# Patient Record
Sex: Female | Born: 1985
Health system: Southern US, Community
[De-identification: ages and names within clinical notes are randomized; demographics above are authoritative.]

## PROBLEM LIST (undated history)

## (undated) ENCOUNTER — Emergency Department (HOSPITAL_BASED_OUTPATIENT_CLINIC_OR_DEPARTMENT_OTHER): Admission: EM | Payer: 59 | Source: Home / Self Care

## (undated) DIAGNOSIS — Z9289 Personal history of other medical treatment: Secondary | ICD-10-CM

## (undated) DIAGNOSIS — T7840XA Allergy, unspecified, initial encounter: Secondary | ICD-10-CM

## (undated) DIAGNOSIS — F329 Major depressive disorder, single episode, unspecified: Secondary | ICD-10-CM

## (undated) DIAGNOSIS — F319 Bipolar disorder, unspecified: Secondary | ICD-10-CM

## (undated) DIAGNOSIS — IMO0002 Reserved for concepts with insufficient information to code with codable children: Secondary | ICD-10-CM

## (undated) DIAGNOSIS — D219 Benign neoplasm of connective and other soft tissue, unspecified: Secondary | ICD-10-CM

## (undated) DIAGNOSIS — F32A Depression, unspecified: Secondary | ICD-10-CM

## (undated) DIAGNOSIS — J45909 Unspecified asthma, uncomplicated: Secondary | ICD-10-CM

## (undated) DIAGNOSIS — R51 Headache: Secondary | ICD-10-CM

## (undated) DIAGNOSIS — N83209 Unspecified ovarian cyst, unspecified side: Secondary | ICD-10-CM

## (undated) HISTORY — DX: Reserved for concepts with insufficient information to code with codable children: IMO0002

## (undated) HISTORY — DX: Allergy, unspecified, initial encounter: T78.40XA

---
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
## 2007-10-26 ENCOUNTER — Emergency Department (HOSPITAL_COMMUNITY): Admission: EM | Admit: 2007-10-26 | Discharge: 2007-10-26 | Payer: Self-pay | Admitting: Emergency Medicine

## 2008-02-23 ENCOUNTER — Emergency Department (HOSPITAL_COMMUNITY): Admission: EM | Admit: 2008-02-23 | Discharge: 2008-02-24 | Payer: Self-pay | Admitting: Emergency Medicine

## 2008-07-18 ENCOUNTER — Emergency Department (HOSPITAL_COMMUNITY): Admission: EM | Admit: 2008-07-18 | Discharge: 2008-07-18 | Payer: Self-pay | Admitting: Emergency Medicine

## 2008-09-13 ENCOUNTER — Emergency Department (HOSPITAL_COMMUNITY): Admission: EM | Admit: 2008-09-13 | Discharge: 2008-09-14 | Payer: Self-pay | Admitting: Emergency Medicine

## 2008-12-19 ENCOUNTER — Emergency Department (HOSPITAL_COMMUNITY): Admission: EM | Admit: 2008-12-19 | Discharge: 2008-12-19 | Payer: Self-pay | Admitting: Emergency Medicine

## 2009-02-01 ENCOUNTER — Emergency Department (HOSPITAL_COMMUNITY): Admission: EM | Admit: 2009-02-01 | Discharge: 2009-02-01 | Payer: Self-pay | Admitting: Emergency Medicine

## 2009-03-26 ENCOUNTER — Emergency Department (HOSPITAL_COMMUNITY): Admission: EM | Admit: 2009-03-26 | Discharge: 2009-03-26 | Payer: Self-pay | Admitting: Emergency Medicine

## 2009-06-06 ENCOUNTER — Emergency Department (HOSPITAL_COMMUNITY): Admission: EM | Admit: 2009-06-06 | Discharge: 2009-06-06 | Payer: Self-pay | Admitting: Emergency Medicine

## 2009-09-08 ENCOUNTER — Emergency Department (HOSPITAL_COMMUNITY): Admission: EM | Admit: 2009-09-08 | Discharge: 2009-09-08 | Payer: Self-pay | Admitting: Emergency Medicine

## 2009-12-12 ENCOUNTER — Emergency Department (HOSPITAL_COMMUNITY): Admission: EM | Admit: 2009-12-12 | Discharge: 2009-12-12 | Payer: Self-pay | Admitting: Emergency Medicine

## 2010-01-20 ENCOUNTER — Emergency Department (HOSPITAL_COMMUNITY): Admission: EM | Admit: 2010-01-20 | Discharge: 2010-01-21 | Payer: Self-pay | Admitting: Emergency Medicine

## 2010-03-17 ENCOUNTER — Emergency Department (HOSPITAL_COMMUNITY): Admission: EM | Admit: 2010-03-17 | Discharge: 2010-03-18 | Payer: Self-pay | Admitting: Emergency Medicine

## 2010-05-04 ENCOUNTER — Emergency Department (HOSPITAL_COMMUNITY): Admission: EM | Admit: 2010-05-04 | Discharge: 2010-05-05 | Payer: Self-pay | Admitting: Emergency Medicine

## 2010-08-26 ENCOUNTER — Emergency Department (HOSPITAL_COMMUNITY)
Admission: EM | Admit: 2010-08-26 | Discharge: 2010-08-26 | Disposition: A | Discharge status: Home / Self Care | Payer: Self-pay | Attending: Emergency Medicine | Admitting: Emergency Medicine

## 2010-08-26 DIAGNOSIS — R109 Unspecified abdominal pain: Secondary | ICD-10-CM | POA: Insufficient documentation

## 2010-08-26 DIAGNOSIS — R112 Nausea with vomiting, unspecified: Secondary | ICD-10-CM | POA: Insufficient documentation

## 2010-08-26 DIAGNOSIS — R51 Headache: Secondary | ICD-10-CM | POA: Insufficient documentation

## 2010-08-26 DIAGNOSIS — IMO0001 Reserved for inherently not codable concepts without codable children: Secondary | ICD-10-CM | POA: Insufficient documentation

## 2010-08-26 DIAGNOSIS — R509 Fever, unspecified: Secondary | ICD-10-CM | POA: Insufficient documentation

## 2010-08-26 LAB — CBC
HCT: 41.6 % (ref 36.0–46.0)
Hemoglobin: 14.8 g/dL (ref 12.0–15.0)
MCH: 32.5 pg (ref 26.0–34.0)
MCHC: 35.6 g/dL (ref 30.0–36.0)
MCV: 91.4 fL (ref 78.0–100.0)

## 2010-08-26 LAB — URINALYSIS, ROUTINE W REFLEX MICROSCOPIC
Ketones, ur: 15 mg/dL — AB
Protein, ur: 30 mg/dL — AB
Urine Glucose, Fasting: NEGATIVE mg/dL
Urobilinogen, UA: 1 mg/dL (ref 0.0–1.0)

## 2010-08-26 LAB — BASIC METABOLIC PANEL
BUN: 10 mg/dL (ref 6–23)
CO2: 26 mEq/L (ref 19–32)
Calcium: 9.5 mg/dL (ref 8.4–10.5)
Creatinine, Ser: 0.63 mg/dL (ref 0.4–1.2)
GFR calc Af Amer: >60 mL/min (ref >60)
Glucose, Bld: 96 mg/dL (ref 70–99)

## 2010-08-26 LAB — DIFFERENTIAL
Basophils Relative: 0 % (ref 0–1)
Lymphs Abs: 0.3 10*3/uL — ABNORMAL LOW (ref 0.7–4.0)
Monocytes Absolute: 0.3 10*3/uL (ref 0.1–1.0)
Monocytes Relative: 3 % (ref 3–12)
Neutro Abs: 8.8 10*3/uL — ABNORMAL HIGH (ref 1.7–7.7)

## 2010-08-26 LAB — URINE MICROSCOPIC-ADD ON

## 2010-09-14 LAB — DIFFERENTIAL
Basophils Absolute: 0 10*3/uL (ref 0.0–0.1)
Basophils Relative: 1 % (ref 0–1)
Lymphocytes Relative: 31 % (ref 12–46)
Monocytes Absolute: 0.5 10*3/uL (ref 0.1–1.0)
Neutro Abs: 4.9 10*3/uL (ref 1.7–7.7)
Neutrophils Relative %: 61 % (ref 43–77)

## 2010-09-14 LAB — URINALYSIS, ROUTINE W REFLEX MICROSCOPIC
Nitrite: NEGATIVE
Specific Gravity, Urine: 1.031 — ABNORMAL HIGH (ref 1.005–1.030)
Urobilinogen, UA: 1 mg/dL (ref 0.0–1.0)
pH: 6.5 (ref 5.0–8.0)

## 2010-09-14 LAB — POCT PREGNANCY, URINE: Preg Test, Ur: NEGATIVE

## 2010-09-14 LAB — BASIC METABOLIC PANEL
BUN: 11 mg/dL (ref 6–23)
Calcium: 9.6 mg/dL (ref 8.4–10.5)
GFR calc non Af Amer: >60 mL/min (ref >60)
Glucose, Bld: 88 mg/dL (ref 70–99)
Potassium: 3.9 mEq/L (ref 3.5–5.1)
Sodium: 135 mEq/L (ref 135–145)

## 2010-09-14 LAB — CBC
HCT: 36.7 % (ref 36.0–46.0)
Hemoglobin: 12.7 g/dL (ref 12.0–15.0)
MCHC: 34.6 g/dL (ref 30.0–36.0)
RDW: 11.7 % (ref 11.5–15.5)
WBC: 7.9 10*3/uL (ref 4.0–10.5)

## 2010-09-18 LAB — RAPID STREP SCREEN (MED CTR MEBANE ONLY): Streptococcus, Group A Screen (Direct): NEGATIVE

## 2010-09-20 LAB — URINALYSIS, ROUTINE W REFLEX MICROSCOPIC
Bilirubin Urine: NEGATIVE
Ketones, ur: NEGATIVE mg/dL
Nitrite: NEGATIVE
Protein, ur: NEGATIVE mg/dL
Urobilinogen, UA: 0.2 mg/dL (ref 0.0–1.0)

## 2010-09-20 LAB — GC/CHLAMYDIA PROBE AMP, GENITAL: Chlamydia, DNA Probe: NEGATIVE

## 2010-09-20 LAB — POCT PREGNANCY, URINE: Preg Test, Ur: NEGATIVE

## 2010-09-20 LAB — WET PREP, GENITAL

## 2010-09-26 LAB — POCT PREGNANCY, URINE: Preg Test, Ur: NEGATIVE

## 2010-09-26 LAB — RAPID STREP SCREEN (MED CTR MEBANE ONLY): Streptococcus, Group A Screen (Direct): POSITIVE — AB

## 2010-10-05 LAB — WET PREP, GENITAL: Trich, Wet Prep: NONE SEEN

## 2010-10-05 LAB — URINE MICROSCOPIC-ADD ON

## 2010-10-05 LAB — URINALYSIS, ROUTINE W REFLEX MICROSCOPIC
Leukocytes, UA: NEGATIVE
Nitrite: NEGATIVE
Specific Gravity, Urine: 1.01 (ref 1.005–1.030)
Urobilinogen, UA: 0.2 mg/dL (ref 0.0–1.0)
pH: 8 (ref 5.0–8.0)

## 2010-10-05 LAB — GC/CHLAMYDIA PROBE AMP, GENITAL: GC Probe Amp, Genital: NEGATIVE

## 2010-10-08 LAB — URINALYSIS, ROUTINE W REFLEX MICROSCOPIC
Nitrite: NEGATIVE
Protein, ur: NEGATIVE mg/dL
Specific Gravity, Urine: 1.022 (ref 1.005–1.030)
Urobilinogen, UA: 1 mg/dL (ref 0.0–1.0)

## 2010-10-08 LAB — URINE MICROSCOPIC-ADD ON

## 2010-10-08 LAB — POCT PREGNANCY, URINE: Preg Test, Ur: NEGATIVE

## 2010-10-09 LAB — URINALYSIS, ROUTINE W REFLEX MICROSCOPIC
Ketones, ur: 15 mg/dL — AB
Nitrite: NEGATIVE
Protein, ur: 30 mg/dL — AB
Urobilinogen, UA: 0.2 mg/dL (ref 0.0–1.0)

## 2010-10-09 LAB — DIFFERENTIAL
Basophils Absolute: 0 10*3/uL (ref 0.0–0.1)
Basophils Relative: 1 % (ref 0–1)
Eosinophils Absolute: 0 10*3/uL (ref 0.0–0.7)
Neutrophils Relative %: 82 % — ABNORMAL HIGH (ref 43–77)

## 2010-10-09 LAB — COMPREHENSIVE METABOLIC PANEL
ALT: 13 U/L (ref 0–35)
Alkaline Phosphatase: 53 U/L (ref 39–117)
BUN: 9 mg/dL (ref 6–23)
CO2: 26 mEq/L (ref 19–32)
GFR calc non Af Amer: >60 mL/min (ref >60)
Glucose, Bld: 72 mg/dL (ref 70–99)
Potassium: 4.5 mEq/L (ref 3.5–5.1)
Sodium: 140 mEq/L (ref 135–145)

## 2010-10-09 LAB — ETHANOL: Alcohol, Ethyl (B): <5 mg/dL (ref 0–10)

## 2010-10-09 LAB — RAPID URINE DRUG SCREEN, HOSP PERFORMED
Amphetamines: NOT DETECTED
Cocaine: NOT DETECTED
Opiates: NOT DETECTED
Tetrahydrocannabinol: NOT DETECTED

## 2010-10-09 LAB — LIPASE, BLOOD: Lipase: 20 U/L (ref 11–59)

## 2010-10-09 LAB — CBC
HCT: 42.3 % (ref 36.0–46.0)
Hemoglobin: 14.7 g/dL (ref 12.0–15.0)
MCHC: 34.7 g/dL (ref 30.0–36.0)
RBC: 4.41 MIL/uL (ref 3.87–5.11)

## 2010-10-09 LAB — URINE MICROSCOPIC-ADD ON

## 2010-10-11 LAB — URINALYSIS, ROUTINE W REFLEX MICROSCOPIC
Bilirubin Urine: NEGATIVE
Hgb urine dipstick: NEGATIVE
Specific Gravity, Urine: 1.022 (ref 1.005–1.030)
Urobilinogen, UA: 0.2 mg/dL (ref 0.0–1.0)

## 2010-10-11 LAB — GC/CHLAMYDIA PROBE AMP, GENITAL: Chlamydia, DNA Probe: NEGATIVE

## 2010-10-11 LAB — WET PREP, GENITAL: Yeast Wet Prep HPF POC: NONE SEEN

## 2010-10-14 LAB — URINALYSIS, ROUTINE W REFLEX MICROSCOPIC
Protein, ur: NEGATIVE mg/dL
Urobilinogen, UA: 0.2 mg/dL (ref 0.0–1.0)

## 2010-10-14 LAB — POCT I-STAT, CHEM 8
BUN: 14 mg/dL (ref 6–23)
Calcium, Ion: 1.22 mmol/L (ref 1.12–1.32)
Chloride: 106 mEq/L (ref 96–112)
HCT: 42 % (ref 36.0–46.0)
Sodium: 138 mEq/L (ref 135–145)
TCO2: 23 mmol/L (ref 0–100)

## 2010-10-14 LAB — DIFFERENTIAL
Lymphocytes Relative: 1 % — ABNORMAL LOW (ref 12–46)
Monocytes Absolute: 0.3 10*3/uL (ref 0.1–1.0)
Monocytes Relative: 2 % — ABNORMAL LOW (ref 3–12)
Neutro Abs: 12.4 10*3/uL — ABNORMAL HIGH (ref 1.7–7.7)
Neutrophils Relative %: 96 % — ABNORMAL HIGH (ref 43–77)

## 2010-10-14 LAB — GC/CHLAMYDIA PROBE AMP, GENITAL: Chlamydia, DNA Probe: NEGATIVE

## 2010-10-14 LAB — WET PREP, GENITAL
Trich, Wet Prep: NONE SEEN
Yeast Wet Prep HPF POC: NONE SEEN

## 2010-10-14 LAB — RPR: RPR Ser Ql: NONREACTIVE

## 2010-10-14 LAB — CBC
Platelets: 274 10*3/uL (ref 150–400)
RDW: 12 % (ref 11.5–15.5)

## 2010-10-14 LAB — URINE MICROSCOPIC-ADD ON

## 2010-10-18 LAB — CULTURE, ROUTINE-ABSCESS

## 2010-11-04 ENCOUNTER — Emergency Department (HOSPITAL_COMMUNITY)
Admission: EM | Admit: 2010-11-04 | Discharge: 2010-11-05 | Disposition: A | Discharge status: Home / Self Care | Payer: Self-pay | Attending: Emergency Medicine | Admitting: Emergency Medicine

## 2010-11-04 DIAGNOSIS — R141 Gas pain: Secondary | ICD-10-CM | POA: Insufficient documentation

## 2010-11-04 DIAGNOSIS — K5900 Constipation, unspecified: Secondary | ICD-10-CM | POA: Insufficient documentation

## 2010-11-04 DIAGNOSIS — R143 Flatulence: Secondary | ICD-10-CM | POA: Insufficient documentation

## 2010-11-04 DIAGNOSIS — R142 Eructation: Secondary | ICD-10-CM | POA: Insufficient documentation

## 2010-11-04 DIAGNOSIS — R109 Unspecified abdominal pain: Secondary | ICD-10-CM | POA: Insufficient documentation

## 2010-11-04 DIAGNOSIS — R110 Nausea: Secondary | ICD-10-CM | POA: Insufficient documentation

## 2010-11-05 ENCOUNTER — Emergency Department (HOSPITAL_COMMUNITY): Payer: Self-pay

## 2010-11-05 LAB — URINALYSIS, ROUTINE W REFLEX MICROSCOPIC
Bilirubin Urine: NEGATIVE
Glucose, UA: NEGATIVE mg/dL
Hgb urine dipstick: NEGATIVE
Ketones, ur: 15 mg/dL — AB
Protein, ur: NEGATIVE mg/dL
Urobilinogen, UA: 1 mg/dL (ref 0.0–1.0)

## 2010-11-05 LAB — BASIC METABOLIC PANEL
BUN: 11 mg/dL (ref 6–23)
CO2: 24 mEq/L (ref 19–32)
Calcium: 9.8 mg/dL (ref 8.4–10.5)
Creatinine, Ser: 0.57 mg/dL (ref 0.4–1.2)
GFR calc non Af Amer: >60 mL/min (ref >60)
Glucose, Bld: 99 mg/dL (ref 70–99)

## 2010-11-05 LAB — CBC
HCT: 33.5 % — ABNORMAL LOW (ref 36.0–46.0)
Hemoglobin: 11.7 g/dL — ABNORMAL LOW (ref 12.0–15.0)
MCH: 32.1 pg (ref 26.0–34.0)
MCHC: 34.9 g/dL (ref 30.0–36.0)
MCV: 92 fL (ref 78.0–100.0)
RDW: 12.2 % (ref 11.5–15.5)

## 2010-11-05 LAB — DIFFERENTIAL
Basophils Absolute: 0 10*3/uL (ref 0.0–0.1)
Eosinophils Relative: 1 % (ref 0–5)
Lymphocytes Relative: 38 % (ref 12–46)
Monocytes Absolute: 0.3 10*3/uL (ref 0.1–1.0)
Monocytes Relative: 5 % (ref 3–12)
Neutro Abs: 3.7 10*3/uL (ref 1.7–7.7)

## 2011-01-31 ENCOUNTER — Emergency Department (HOSPITAL_COMMUNITY): Payer: Self-pay

## 2011-01-31 ENCOUNTER — Emergency Department (HOSPITAL_COMMUNITY)
Admission: EM | Admit: 2011-01-31 | Discharge: 2011-01-31 | Disposition: A | Discharge status: Home / Self Care | Payer: Self-pay | Attending: Emergency Medicine | Admitting: Emergency Medicine

## 2011-01-31 DIAGNOSIS — M25579 Pain in unspecified ankle and joints of unspecified foot: Secondary | ICD-10-CM | POA: Insufficient documentation

## 2011-01-31 DIAGNOSIS — M25559 Pain in unspecified hip: Secondary | ICD-10-CM | POA: Insufficient documentation

## 2011-01-31 DIAGNOSIS — M542 Cervicalgia: Secondary | ICD-10-CM | POA: Insufficient documentation

## 2011-01-31 DIAGNOSIS — S7010XA Contusion of unspecified thigh, initial encounter: Secondary | ICD-10-CM | POA: Insufficient documentation

## 2011-01-31 DIAGNOSIS — S93409A Sprain of unspecified ligament of unspecified ankle, initial encounter: Secondary | ICD-10-CM | POA: Insufficient documentation

## 2011-01-31 DIAGNOSIS — R1909 Other intra-abdominal and pelvic swelling, mass and lump: Secondary | ICD-10-CM | POA: Insufficient documentation

## 2011-01-31 DIAGNOSIS — S139XXA Sprain of joints and ligaments of unspecified parts of neck, initial encounter: Secondary | ICD-10-CM | POA: Insufficient documentation

## 2011-01-31 DIAGNOSIS — M79609 Pain in unspecified limb: Secondary | ICD-10-CM | POA: Insufficient documentation

## 2011-02-02 ENCOUNTER — Encounter: Payer: Self-pay | Admitting: Obstetrics and Gynecology

## 2011-04-01 ENCOUNTER — Emergency Department (HOSPITAL_COMMUNITY): Payer: Self-pay

## 2011-04-01 ENCOUNTER — Emergency Department (HOSPITAL_COMMUNITY)
Admission: EM | Admit: 2011-04-01 | Discharge: 2011-04-01 | Disposition: A | Discharge status: Home / Self Care | Payer: Self-pay | Attending: Emergency Medicine | Admitting: Emergency Medicine

## 2011-04-01 ENCOUNTER — Inpatient Hospital Stay (INDEPENDENT_AMBULATORY_CARE_PROVIDER_SITE_OTHER)
Admission: RE | Admit: 2011-04-01 | Discharge: 2011-04-01 | Disposition: A | Discharge status: Home / Self Care | Payer: Self-pay | Source: Ambulatory Visit | Attending: Emergency Medicine | Admitting: Emergency Medicine

## 2011-04-01 DIAGNOSIS — R0789 Other chest pain: Secondary | ICD-10-CM | POA: Insufficient documentation

## 2011-04-01 DIAGNOSIS — R05 Cough: Secondary | ICD-10-CM | POA: Insufficient documentation

## 2011-04-01 DIAGNOSIS — R059 Cough, unspecified: Secondary | ICD-10-CM | POA: Insufficient documentation

## 2011-04-01 DIAGNOSIS — I4949 Other premature depolarization: Secondary | ICD-10-CM

## 2011-04-01 DIAGNOSIS — R0989 Other specified symptoms and signs involving the circulatory and respiratory systems: Secondary | ICD-10-CM

## 2011-04-01 DIAGNOSIS — N949 Unspecified condition associated with female genital organs and menstrual cycle: Secondary | ICD-10-CM | POA: Insufficient documentation

## 2011-04-01 DIAGNOSIS — R0602 Shortness of breath: Secondary | ICD-10-CM | POA: Insufficient documentation

## 2011-04-01 LAB — POCT URINALYSIS DIP (DEVICE)
Glucose, UA: NEGATIVE mg/dL
Hgb urine dipstick: NEGATIVE
Specific Gravity, Urine: 1.02 (ref 1.005–1.030)
Urobilinogen, UA: 0.2 mg/dL (ref 0.0–1.0)
pH: 7.5 (ref 5.0–8.0)

## 2011-06-08 ENCOUNTER — Emergency Department (HOSPITAL_COMMUNITY)
Admission: EM | Admit: 2011-06-08 | Discharge: 2011-06-08 | Disposition: A | Discharge status: Home / Self Care | Payer: Self-pay | Attending: Emergency Medicine | Admitting: Emergency Medicine

## 2011-06-08 DIAGNOSIS — M546 Pain in thoracic spine: Secondary | ICD-10-CM | POA: Insufficient documentation

## 2011-06-08 MED ORDER — CYCLOBENZAPRINE HCL 10 MG PO TABS: 10.0000 mg | ORAL_TABLET | Freq: Two times a day (BID) | ORAL | Status: AC | PRN

## 2011-06-08 MED ORDER — OXYCODONE-ACETAMINOPHEN 5-325 MG PO TABS: 1.0000 | ORAL_TABLET | ORAL | Status: AC | PRN

## 2011-06-08 MED ORDER — OXYCODONE-ACETAMINOPHEN 5-325 MG PO TABS
1.0000 | ORAL_TABLET | Freq: Once | ORAL | Status: AC
  Administered 2011-06-08: 1 via ORAL
  Filled 2011-06-08: qty 1

## 2011-06-08 NOTE — ED Provider Notes (Signed)
History     CSN: [id number] Arrival date & time: 06/08/2011  1:04 PM   First MD Initiated Contact with Patient 06/08/11 1627      Chief Complaint  Patient presents with  . Back Pain    (Consider location/radiation/quality/duration/timing/severity/associated sxs/prior treatment) Patient is a 25 y.o. female presenting with back pain. The history is provided by the patient.  Back Pain  This is a new problem. The current episode started 2 days ago. The problem occurs constantly. The problem has not changed since onset.The pain is associated with no known injury. The pain is present in the thoracic spine. The quality of the pain is described as aching. The pain does not radiate. The pain is at a severity of 7/10. The symptoms are aggravated by bending and certain positions. The pain is the same all the time. Pertinent negatives include no chest pain, no fever, no headaches, no abdominal pain, no bowel incontinence, no bladder incontinence and no weakness. She has tried NSAIDs for the symptoms. The treatment provided mild relief.    History reviewed. No pertinent past medical history.  History reviewed. No pertinent past surgical history.  No family history on file.  History  Substance Use Topics  . Smoking status: Never Smoker   . Smokeless tobacco: Not on file  . Alcohol Use: Yes    OB History    Grav Para Term Preterm Abortions TAB SAB Ect Mult Living                  Review of Systems  Constitutional: Negative for fever and chills.  HENT: Negative for neck pain.   Respiratory: Negative for cough and shortness of breath.   Cardiovascular: Negative for chest pain and palpitations.  Gastrointestinal: Negative for nausea, vomiting, abdominal pain and bowel incontinence.  Genitourinary: Negative for bladder incontinence.  Musculoskeletal: Positive for back pain. Negative for gait problem.  Skin: Negative for color change and rash.  Neurological: Negative for weakness,  light-headedness and headaches.  All other systems reviewed and are negative.    Allergies  Penicillins  Home Medications  No current outpatient prescriptions on file.  BP 133/68  Pulse 79  Temp(Src) 98.9 F (37.2 C) (Oral)  Resp 16  Ht 5\' 7"  (1.702 m)  Wt 200 lb (90.719 kg)  BMI 31.32 kg/m2  SpO2 96%  LMP 05/29/2011  Physical Exam  Nursing note and vitals reviewed. Constitutional: She is oriented to person, place, and time. She appears well-developed and well-nourished.  HENT:  Head: Normocephalic and atraumatic.  Eyes: Pupils are equal, round, and reactive to light.  Cardiovascular: Normal rate, regular rhythm, normal heart sounds and intact distal pulses.   Pulmonary/Chest: Effort normal and breath sounds normal. No respiratory distress. She exhibits no tenderness.  Abdominal: Soft. She exhibits no distension. There is no tenderness.  Musculoskeletal: She exhibits no edema.       Arms: Neurological: She is alert and oriented to person, place, and time.  Skin: Skin is warm and dry.  Psychiatric: She has a normal mood and affect.    ED Course  Procedures (including critical care time)  Labs Reviewed - No data to display No results found.   No diagnosis found.    MDM  This is a 25 year old female, who presents today for upper back pain, which began when she woke from sleep. States she has been taking Tylenol and ibuprofen over-the-counter, with some transient relief of her pain. She notes that at times when she takes a  deep breath the pain in her back seems to feel worse, and there is also a strong positional component to the pain. She denies any chest pain, recent injuries, cough, fevers or other infectious symptoms, lower extremity edema, and does not take birth control pills. Exam is remarkable for mid thoracic paraspinal muscular tenderness, as well as moderate tenderness in the bilateral trapezii, reproducing her subjective complaint, and she has some worsening  of the pain with movement of the neck and shoulders. The findings on exam, the liver the patient's pain is constant by a musculoskeletal source, will have patient continue treatment with over-the-counter medications, as well as muscle relaxants, discussed expectation of pain course, continued treatment, and indications were returned. The patient expresses understanding with this plan.        Theotis Burrow, MD 06/09/11 [phone]

## 2011-06-08 NOTE — ED Notes (Signed)
Patient states she woke up x 2 days ago with back pain. 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 Patient denies she has not done anything to hurt her back. Patient states the pain is so bad that she has a hard time getting her breath some time. Patient denies chest pain, n/v/fever.

## 2011-06-08 NOTE — Progress Notes (Signed)
25 year old female with a two-day history of upper back pain worse with movement worse with deep breath. Her vital signs are normal, and her exam shows significant muscle spasm of the paraspinal muscles in the thoracic region. Lungs are clear. Heart has regular rate and rhythm with a 2/6 systolic ejection murmur heard along the sternal border. Overall picture is the most consistent with musculoskeletal pain. She will be treated with NSAIDs, muscle relaxers, and narcotics.

## 2011-06-08 NOTE — ED Notes (Signed)
Pt presents with onset of upper back pain x 2 days that has been constant and today, radiated into mid-chest.  Pt reports shortness of breath, denies any injury or cough.

## 2011-06-09 NOTE — ED Provider Notes (Signed)
I saw and evaluated the patient, reviewed the resident's note and I agree with the findings and plan.   Dione Booze, MD 06/09/11 1044

## 2011-09-13 ENCOUNTER — Emergency Department (HOSPITAL_COMMUNITY): Payer: PRIVATE HEALTH INSURANCE

## 2011-09-13 ENCOUNTER — Emergency Department (HOSPITAL_COMMUNITY)
Admission: EM | Admit: 2011-09-13 | Discharge: 2011-09-13 | Disposition: A | Discharge status: Home / Self Care | Payer: PRIVATE HEALTH INSURANCE | Attending: Emergency Medicine | Admitting: Emergency Medicine

## 2011-09-13 ENCOUNTER — Encounter (HOSPITAL_COMMUNITY): Payer: Self-pay | Admitting: *Deleted

## 2011-09-13 DIAGNOSIS — J069 Acute upper respiratory infection, unspecified: Secondary | ICD-10-CM | POA: Insufficient documentation

## 2011-09-13 DIAGNOSIS — R509 Fever, unspecified: Secondary | ICD-10-CM | POA: Insufficient documentation

## 2011-09-13 DIAGNOSIS — R05 Cough: Secondary | ICD-10-CM | POA: Insufficient documentation

## 2011-09-13 DIAGNOSIS — R110 Nausea: Secondary | ICD-10-CM | POA: Insufficient documentation

## 2011-09-13 DIAGNOSIS — R059 Cough, unspecified: Secondary | ICD-10-CM | POA: Insufficient documentation

## 2011-09-13 LAB — DIFFERENTIAL
Basophils Absolute: 0 10*3/uL (ref 0.0–0.1)
Eosinophils Relative: 1 % (ref 0–5)
Lymphocytes Relative: 25 % (ref 12–46)
Lymphs Abs: 1.3 10*3/uL (ref 0.7–4.0)
Neutro Abs: 3.1 10*3/uL (ref 1.7–7.7)

## 2011-09-13 LAB — BASIC METABOLIC PANEL
CO2: 24 mEq/L (ref 19–32)
Calcium: 9 mg/dL (ref 8.4–10.5)
Chloride: 100 mEq/L (ref 96–112)
Glucose, Bld: 95 mg/dL (ref 70–99)
Potassium: 3.7 mEq/L (ref 3.5–5.1)
Sodium: 134 mEq/L — ABNORMAL LOW (ref 135–145)

## 2011-09-13 LAB — CBC
MCV: 90.1 fL (ref 78.0–100.0)
Platelets: 253 10*3/uL (ref 150–400)
RBC: 3.85 MIL/uL — ABNORMAL LOW (ref 3.87–5.11)
WBC: 5 10*3/uL (ref 4.0–10.5)

## 2011-09-13 LAB — URINALYSIS, ROUTINE W REFLEX MICROSCOPIC
Glucose, UA: NEGATIVE mg/dL
Hgb urine dipstick: NEGATIVE
Leukocytes, UA: NEGATIVE
Specific Gravity, Urine: 1.018 (ref 1.005–1.030)
Urobilinogen, UA: 1 mg/dL (ref 0.0–1.0)

## 2011-09-13 LAB — POCT PREGNANCY, URINE: Preg Test, Ur: NEGATIVE

## 2011-09-13 MED ORDER — HYDROCODONE-HOMATROPINE 5-1.5 MG/5ML PO SYRP: 2.5000 mL | ORAL_SOLUTION | Freq: Four times a day (QID) | ORAL | Status: AC | PRN

## 2011-09-13 MED ORDER — ONDANSETRON HCL 4 MG/2ML IJ SOLN
4.0000 mg | Freq: Once | INTRAMUSCULAR | Status: AC
  Administered 2011-09-13: 4 mg via INTRAVENOUS
  Filled 2011-09-13: qty 2

## 2011-09-13 MED ORDER — KETOROLAC TROMETHAMINE 30 MG/ML IJ SOLN
30.0000 mg | Freq: Once | INTRAMUSCULAR | Status: AC
  Administered 2011-09-13: 30 mg via INTRAVENOUS
  Filled 2011-09-13: qty 1

## 2011-09-13 MED ORDER — SODIUM CHLORIDE 0.9 % IV BOLUS (SEPSIS)
1000.0000 mL | Freq: Once | INTRAVENOUS | Status: AC
  Administered 2011-09-13: 1000 mL via INTRAVENOUS

## 2011-09-13 NOTE — ED Notes (Signed)
Patient stated she has been coughing since Saturday and started vomiting tonight

## 2011-09-13 NOTE — ED Provider Notes (Signed)
History     CSN: [id number]  Arrival date & time 09/13/11  0027   First MD Initiated Contact with Patient 09/13/11 [phone]      Chief Complaint  Patient presents with  . Chills     HPI The patient presents with generalized discomfort, cough, nausea.  Her symptoms began gradually 2 days ago.  Since onset symptoms have been worsening.  She denies focal new pain.  She notes no relief with OTC medication.  She notes a fever yesterday of 100.  No confusion, no disorientation, no dyspnea, no chest pain. History reviewed. No pertinent past medical history.  History reviewed. No pertinent past surgical history.  History reviewed. No pertinent family history.  History  Substance Use Topics  . Smoking status: Never Smoker   . Smokeless tobacco: Not on file  . Alcohol Use: Yes     sosically    OB History    Grav Para Term Preterm Abortions TAB SAB Ect Mult Living                  Review of Systems  Constitutional:       HPI  HENT:       HPI otherwise negative  Eyes: Negative.   Respiratory:       HPI, otherwise negative  Cardiovascular:       HPI, otherwise nmegative  Gastrointestinal: Positive for nausea and vomiting. Negative for diarrhea.  Genitourinary:       HPI, otherwise negative  Musculoskeletal:       HPI, otherwise negative  Skin: Negative.   Neurological: Negative for syncope.    Allergies  Penicillins  Home Medications   Current Outpatient Rx  Name Route Sig Dispense Refill  . LUPRON IJ Injection Inject as directed. [facility]-first injection was last week      BP 132/79  Pulse 118  Temp(Src) 99.2 F (37.3 C) (Oral)  Resp 22  SpO2 100%  LMP 08/21/2011  Physical Exam  Nursing note and vitals reviewed. Constitutional: She is oriented to person, place, and time. She appears well-developed and well-nourished. No distress.  HENT:  Head: Normocephalic and atraumatic.  Eyes: Conjunctivae and EOM are normal.  Cardiovascular: Regular rhythm.   Tachycardia present.   Pulmonary/Chest: Effort normal and breath sounds normal. No stridor. No respiratory distress.  Abdominal: She exhibits mass. She exhibits no distension. There is no tenderness.       Lower abdominal firmness, fibroid, per the patient  Musculoskeletal: She exhibits no edema.  Neurological: She is alert and oriented to person, place, and time. No cranial nerve deficit.  Skin: Skin is warm and dry.  Psychiatric: She has a normal mood and affect.    ED Course  Procedures (including critical care time)  Labs Reviewed  CBC - Abnormal; Notable for the following:    RBC 3.85 (*)    HCT 34.7 (*)    All other components within normal limits  BASIC METABOLIC PANEL - Abnormal; Notable for the following:    Sodium 134 (*)    All other components within normal limits  URINALYSIS, ROUTINE W REFLEX MICROSCOPIC  DIFFERENTIAL  POCT PREGNANCY, URINE   Dg Chest 2 View  09/13/2011  *RADIOLOGY REPORT*  Clinical Data: Painful cough  CHEST - 2 VIEW  Comparison: 04/01/2011  Findings: Heart is normal in size.  Lungs are clear.  No pleural  IMPRESSION: No active cardiopulmonary disease.  Original Report Authenticated By: Donavan Burnet, M.D.   Chest x-ray reviewed  by me No diagnosis found.    MDM  This previously well young female presents with 2 days of generalized discomfort, chills.  On exam she is in no distress with clear lung sounds and unremarkable vital signs.  The patient's labs are reassuring for the absence of acute systemic infection.  The patient's chest x-rays similarly reassuring.  The patient's presentation is most consistent with an acute respiratory infection.  The patient was discharged in stable condition with antitussives PMD followup.      Gerhard Munch, MD 09/13/11 [phone]

## 2011-09-13 NOTE — Discharge Instructions (Signed)
Antibiotic Nonuse  Your caregiver felt that the infection or problem was not one that would be helped with an antibiotic. Infections may be caused by viruses or bacteria. Only a caregiver can tell which one of these is the likely cause of an illness. A cold is the most common cause of infection in both adults and children. A cold is a virus. Antibiotic treatment will have no effect on a viral infection. Viruses can lead to many lost days of work caring for sick children and many missed days of school. Children may catch as many as 10 "colds" or "flus" per year during which they can be tearful, cranky, and uncomfortable. The goal of treating a virus is aimed at keeping the ill person comfortable. Antibiotics are medications used to help the body fight bacterial infections. There are relatively few types of bacteria that cause infections but there are hundreds of viruses. While both viruses and bacteria cause infection they are very different types of germs. A viral infection will typically go away by itself within 7 to 10 days. Bacterial infections may spread or get worse without antibiotic treatment. Examples of bacterial infections are:  Sore throats (like strep throat or tonsillitis).   Infection in the lung (pneumonia).   Ear and skin infections.  Examples of viral infections are:  Colds or flus.   Most coughs and bronchitis.   Sore throats not caused by Strep.   Runny noses.  It is often best not to take an antibiotic when a viral infection is the cause of the problem. Antibiotics can kill off the helpful bacteria that we have inside our body and allow harmful bacteria to start growing. Antibiotics can cause side effects such as allergies, nausea, and diarrhea without helping to improve the symptoms of the viral infection. Additionally, repeated uses of antibiotics can cause bacteria inside of our body to become resistant. That resistance can be passed onto harmful bacterial. The next time  you have an infection it may be harder to treat if antibiotics are used when they are not needed. Not treating with antibiotics allows our own immune system to develop and take care of infections more efficiently. Also, antibiotics will work better for us when they are prescribed for bacterial infections. Treatments for a child that is ill may include:  Give extra fluids throughout the day to stay hydrated.   Get plenty of rest.   Only give your child over-the-counter or prescription medicines for pain, discomfort, or fever as directed by your caregiver.   The use of a cool mist humidifier may help stuffy noses.   Cold medications if suggested by your caregiver.  Your caregiver may decide to start you on an antibiotic if:  The problem you were seen for today continues for a longer length of time than expected.   You develop a secondary bacterial infection.  SEEK MEDICAL CARE IF:  Fever lasts longer than 5 days.   Symptoms continue to get worse after 5 to 7 days or become severe.   Difficulty in breathing develops.   Signs of dehydration develop (poor drinking, rare urinating, dark colored urine).   Changes in behavior or worsening tiredness (listlessness or lethargy).  Document Released: 08/29/2001 Document Revised: 06/09/2011 Document Reviewed: 02/25/2009 [employer] Patient Information 2012 [employer]. 

## 2011-09-13 NOTE — ED Notes (Signed)
She has ha cold  Coughing chest congestion and she has vomited since Saturday with a high temp

## 2011-09-14 ENCOUNTER — Encounter (HOSPITAL_COMMUNITY): Payer: Self-pay | Admitting: Emergency Medicine

## 2011-09-14 ENCOUNTER — Emergency Department (HOSPITAL_COMMUNITY)
Admission: EM | Admit: 2011-09-14 | Discharge: 2011-09-14 | Disposition: A | Discharge status: Home / Self Care | Payer: PRIVATE HEALTH INSURANCE | Attending: Emergency Medicine | Admitting: Emergency Medicine

## 2011-09-14 ENCOUNTER — Emergency Department (HOSPITAL_COMMUNITY): Payer: PRIVATE HEALTH INSURANCE

## 2011-09-14 DIAGNOSIS — J189 Pneumonia, unspecified organism: Secondary | ICD-10-CM | POA: Insufficient documentation

## 2011-09-14 MED ORDER — ALBUTEROL SULFATE HFA 108 (90 BASE) MCG/ACT IN AERS: INHALATION_SPRAY | RESPIRATORY_TRACT | Status: DC

## 2011-09-14 MED ORDER — AZITHROMYCIN 250 MG PO TABS: ORAL_TABLET | ORAL | Status: AC

## 2011-09-14 NOTE — ED Provider Notes (Signed)
History     CSN: [id number]  Arrival date & time 09/14/11  1347   First MD Initiated Contact with Patient 09/14/11 1642      Chief Complaint  Patient presents with  . Cough  . Nasal Congestion  . 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 She denies abdominal pain n/v/d. Patient states she has no known medical problems and takes no meds on regular basis. She denies aggravating or alleviating factors. She has taken previously prescribed cough syrup without relief of cough.   History reviewed. No pertinent past medical history.  History reviewed. No pertinent past surgical history.  No family history on file.  History  Substance Use Topics  . Smoking status: Never Smoker   . Smokeless tobacco: Not on file  . Alcohol Use: Yes     sosically    OB History    Grav Para Term Preterm Abortions TAB SAB Ect Mult Living                  Review of Systems  All other systems reviewed and are negative.    Allergies  Penicillins  Home Medications   Current Outpatient Rx  Name Route Sig Dispense Refill  . HYDROCODONE-HOMATROPINE 5-1.5 MG/5ML PO SYRP Oral Take 2.5 mLs by mouth every 6 (six) hours as needed for cough. 30 mL 0  . LUPRON IJ Injection Inject as directed. [facility]-first injection was last week    . ALBUTEROL SULFATE HFA 108 (90 BASE) MCG/ACT IN AERS  Inhale 1-2 puffs into the lungs every 6 (six) hours as needed for coughing. 1 Inhaler 0  . AZITHROMYCIN 250 MG PO TABS  Take 2 tablets by mouth the first day and then take 1 tablet by mouth for the next  4 days for a total of 5 days. 6 tablet 0    BP 140/67  Pulse 92  Temp(Src) 98.9 F (37.2 C) (Oral)  Resp 22  SpO2 100%  LMP 08/21/2011  Physical Exam  Vitals reviewed. Constitutional: She is oriented to person, place, and time. She appears well-developed and well-nourished. No distress.  HENT:  Head: Normocephalic and atraumatic.  Right Ear: External ear normal.  Left Ear: External ear normal.  Nose: Nose normal.  Mouth/Throat: No oropharyngeal exudate.       Mild erythema of posterior pharynx and tonsils no tonsillar exudate or enlargement. Patent airway. Swallowing secretions well  Eyes: Conjunctivae are normal.  Neck: Normal range of motion. Neck supple.  Cardiovascular: Normal rate, regular rhythm and normal heart sounds.  Exam reveals no gallop and no friction rub.   No murmur heard. Pulmonary/Chest: Effort normal and breath sounds normal. No respiratory distress. She has no wheezes. She has no rales. She exhibits no tenderness.  Abdominal: Soft. Bowel sounds are normal. She exhibits no distension and no mass. There is no tenderness. There is no rebound and no guarding.  Lymphadenopathy:    She has no cervical adenopathy.  Neurological: She is alert and oriented to person, place, and time. She has normal reflexes.  Skin: Skin is warm and dry. No rash  noted. She is not diaphoretic.  Psychiatric: She has a normal mood and affect.    ED Course  Procedures (including critical care time)  Labs Reviewed - No data to display Dg Chest 2 View  09/14/2011  *RADIOLOGY REPORT*  Clinical Data: Cough and chest pain.  CHEST - 2 VIEW  Comparison: A chest x-ray 09/13/2011.  Findings: When compared to the recent prior radiograph there is a subtle area of interstitial prominence and patchy air space disease in the left mid and lower hemithorax which appears to project over the spine on the lateral view, concerning for an area of early infection (possibly a bronchopneumonia) within the left  lower lobe. Lungs are otherwise clear.  No definite pleural effusions. Pulmonary vasculature is normal.  Heart size is normal. Mediastinal contours are unremarkable.  IMPRESSION: 1.  Findings, as above, concerning for developing bronchopneumonia in the left lower lobe.  Original Report Authenticated By: Florencia Reasons, M.D.   Dg Chest 2 View  09/13/2011  *RADIOLOGY REPORT*  Clinical Data: Painful cough  CHEST - 2 VIEW  Comparison: 04/01/2011  Findings: Heart is normal in size.  Lungs are clear.  No pleural  IMPRESSION: No active cardiopulmonary disease.  Original Report Authenticated By: Donavan Burnet, M.D.     1. CAP (community acquired pneumonia)       MDM  Patient has no comorbidities and therefore appropriate for OP management of CAP. Pulse ox 100% on RA without respiratory difficulty. Non toxic appearing.         Jenness [place], [place] 09/14/11 [phone]

## 2011-09-14 NOTE — Discharge Instructions (Signed)
Take antibiotic in complete course. Stay well hydrated. Alternate between tylenol and ibuprofen for mild aches, pains and fever. Use albuterol inhaler as directed for cough. Establishment with a Primary Care provider is Very important for general health care concerns, minor illness and minor injury as well for recheck of ongoing symptoms in 5-7 days Return to ER for changing or worsening of symptoms.  Pneumonia, Adult Pneumonia is an infection of the lungs.  CAUSES Pneumonia may be caused by bacteria or a virus. Usually, these infections are caused by breathing infectious particles into the lungs (respiratory tract). SYMPTOMS   Cough.   Fever.   Chest pain.   Increased rate of breathing.   Wheezing.   Mucus production.  DIAGNOSIS  If you have the common symptoms of pneumonia, your caregiver will typically confirm the diagnosis with a chest X-ray. The X-ray will show an abnormality in the lung (pulmonary infiltrate) if you have pneumonia. Other tests of your blood, urine, or sputum may be done to find the specific cause of your pneumonia. Your caregiver may also do tests (blood gases or pulse oximetry) to see how well your lungs are working. TREATMENT  Some forms of pneumonia may be spread to other people when you cough or sneeze. You may be asked to wear a mask before and during your exam. Pneumonia that is caused by bacteria is treated with antibiotic medicine. Pneumonia that is caused by the influenza virus may be treated with an antiviral medicine. Most other viral infections must run their course. These infections will not respond to antibiotics.  PREVENTION A pneumococcal shot (vaccine) is available to prevent a common bacterial cause of pneumonia. This is usually suggested for:  People over 64 years old.   Patients on chemotherapy.   People with chronic lung problems, such as bronchitis or emphysema.   People with immune system problems.  If you are over 65 or have a high risk  condition, you may receive the pneumococcal vaccine if you have not received it before. In some countries, a routine influenza vaccine is also recommended. This vaccine can help prevent some cases of pneumonia.You may be offered the influenza vaccine as part of your care. If you smoke, it is time to quit. You may receive instructions on how to stop smoking. Your caregiver can provide medicines and counseling to help you quit. HOME CARE INSTRUCTIONS   Cough suppressants may be used if you are losing too much rest. However, coughing protects you by clearing your lungs. You should avoid using cough suppressants if you can.   Your caregiver may have prescribed medicine if he or she thinks your pneumonia is caused by a bacteria or influenza. Finish your medicine even if you start to feel better.   Your caregiver may also prescribe an expectorant. This loosens the mucus to be coughed up.   Only take over-the-counter or prescription medicines for pain, discomfort, or fever as directed by your caregiver.   Do not smoke. Smoking is a common cause of bronchitis and can contribute to pneumonia. If you are a smoker and continue to smoke, your cough may last several weeks after your pneumonia has cleared.   A cold steam vaporizer or humidifier in your room or home may help loosen mucus.   Coughing is often worse at night. Sleeping in a semi-upright position in a recliner or using a couple pillows under your head will help with this.   Get rest as you feel it is needed. Your body will  usually let you know when you need to rest.  SEEK IMMEDIATE MEDICAL CARE IF:   Your illness becomes worse. This is especially true if you are elderly or weakened from any other disease.   You cannot control your cough with suppressants and are losing sleep.   You begin coughing up blood.   You develop pain which is getting worse or is uncontrolled with medicines.   You have a fever.   Any of the symptoms which  initially brought you in for treatment are getting worse rather than better.   You develop shortness of breath or chest pain.  MAKE SURE YOU:   Understand these instructions.   Will watch your condition.   Will get help right away if you are not doing well or get worse.  Document Released: 06/20/2005 Document Revised: 06/09/2011 Document Reviewed: 09/09/2010 [facility] Patient Information 2012 [place], [place].

## 2011-09-14 NOTE — ED Notes (Signed)
Pt states that she has had a cold and runny nose and sinus pressure since sat and coughing a lot to make her chest hurt,

## 2011-09-14 NOTE — ED Notes (Signed)
Pt reports cough with pain in her ribs and back-worse when coughing.  Pt reports sxs onset was Saturday.  Pt reports coughing up yellowish sputum.  Denies any fever at this time.

## 2011-09-15 NOTE — ED Provider Notes (Signed)
Medical screening examination/treatment/procedure(s) were performed by non-physician practitioner and as supervising physician I was immediately available for consultation/collaboration.   Laray Anger, DO 09/15/11 1524

## 2011-11-02 DIAGNOSIS — Z9289 Personal history of other medical treatment: Secondary | ICD-10-CM

## 2011-11-02 HISTORY — DX: Personal history of other medical treatment: Z92.89

## 2011-11-03 ENCOUNTER — Other Ambulatory Visit: Payer: Self-pay | Admitting: Obstetrics and Gynecology

## 2011-11-07 ENCOUNTER — Ambulatory Visit (INDEPENDENT_AMBULATORY_CARE_PROVIDER_SITE_OTHER): Payer: PRIVATE HEALTH INSURANCE | Admitting: Family Medicine

## 2011-11-07 ENCOUNTER — Encounter: Payer: Self-pay | Admitting: Family Medicine

## 2011-11-07 VITALS — BP 111/80 | HR 88 | Ht 67.0 in | Wt 183.0 lb

## 2011-11-07 DIAGNOSIS — J309 Allergic rhinitis, unspecified: Secondary | ICD-10-CM

## 2011-11-07 DIAGNOSIS — M549 Dorsalgia, unspecified: Secondary | ICD-10-CM

## 2011-11-07 DIAGNOSIS — F329 Major depressive disorder, single episode, unspecified: Secondary | ICD-10-CM

## 2011-11-07 MED ORDER — FLUOXETINE HCL 20 MG PO TABS: 20.0000 mg | ORAL_TABLET | Freq: Every day | ORAL | Status: DC

## 2011-11-07 MED ORDER — MOMETASONE FUROATE 50 MCG/ACT NA SUSP: 2.0000 | Freq: Every day | NASAL | Status: DC

## 2011-11-07 NOTE — Patient Instructions (Signed)
For the back pain, you can take tylenol 2 tabs 4 times a day, as long as you don't exceed 3000mg  in 24 hrs. You can also take ibuprofen as needed 1-2 tabs every 6hrs.  I am going to refer you to physical therapy which I think will help.   I will also send for your allergies a medication called nasonex.

## 2011-11-09 ENCOUNTER — Encounter: Payer: Self-pay | Admitting: Family Medicine

## 2011-11-09 DIAGNOSIS — M549 Dorsalgia, unspecified: Secondary | ICD-10-CM | POA: Insufficient documentation

## 2011-11-09 DIAGNOSIS — F329 Major depressive disorder, single episode, unspecified: Secondary | ICD-10-CM | POA: Insufficient documentation

## 2011-11-09 DIAGNOSIS — F32A Depression, unspecified: Secondary | ICD-10-CM | POA: Insufficient documentation
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
## 2011-11-09 NOTE — Assessment & Plan Note (Addendum)
No red flags warranting imaging at this time. Recommended tylenol and ibuprofen. Also will refer for physical therapy. Will also talk about physical activity at next visit.

## 2011-11-09 NOTE — Assessment & Plan Note (Addendum)
Phq9: score of 23 Patient open to starting medication. Not interested in counseling at the time as she feels she doesn't have the time in her schedule.  No signs of mania.  Start fluoxetine 20 and follow up in 3-4 weeks after her abdominal surgery (scheduled for 12/02/11).

## 2011-11-09 NOTE — Assessment & Plan Note (Signed)
Continue nasonex

## 2011-11-14 ENCOUNTER — Encounter (HOSPITAL_COMMUNITY): Payer: Self-pay | Admitting: Pharmacist

## 2011-11-16 ENCOUNTER — Encounter (HOSPITAL_COMMUNITY)
Admission: RE | Admit: 2011-11-16 | Discharge: 2011-11-16 | Disposition: A | Discharge status: Home / Self Care | Payer: PRIVATE HEALTH INSURANCE | Source: Ambulatory Visit | Attending: Obstetrics and Gynecology | Admitting: Obstetrics and Gynecology

## 2011-11-16 ENCOUNTER — Encounter (HOSPITAL_COMMUNITY): Payer: Self-pay

## 2011-11-16 DIAGNOSIS — Z01818 Encounter for other preprocedural examination: Secondary | ICD-10-CM | POA: Insufficient documentation

## 2011-11-16 DIAGNOSIS — Z01812 Encounter for preprocedural laboratory examination: Secondary | ICD-10-CM | POA: Insufficient documentation

## 2011-11-16 HISTORY — DX: Depression, unspecified: F32.A

## 2011-11-16 HISTORY — DX: Major depressive disorder, single episode, unspecified: F32.9

## 2011-11-16 LAB — COMPREHENSIVE METABOLIC PANEL
ALT: 9 U/L (ref 0–35)
Alkaline Phosphatase: 47 U/L (ref 39–117)
BUN: 9 mg/dL (ref 6–23)
CO2: 22 mEq/L (ref 19–32)
Chloride: 104 mEq/L (ref 96–112)
GFR calc Af Amer: >90 mL/min (ref >90)
Glucose, Bld: 105 mg/dL — ABNORMAL HIGH (ref 70–99)
Potassium: 3.6 mEq/L (ref 3.5–5.1)
Sodium: 137 mEq/L (ref 135–145)
Total Bilirubin: 0.5 mg/dL (ref 0.3–1.2)
Total Protein: 7.1 g/dL (ref 6.0–8.3)

## 2011-11-16 LAB — SURGICAL PCR SCREEN: Staphylococcus aureus: NEGATIVE

## 2011-11-16 LAB — CBC
HCT: 36.3 % (ref 36.0–46.0)
Hemoglobin: 12.1 g/dL (ref 12.0–15.0)
RBC: 3.92 MIL/uL (ref 3.87–5.11)
WBC: 4.7 10*3/uL (ref 4.0–10.5)

## 2011-11-16 LAB — APTT: aPTT: 31 seconds (ref 24–37)

## 2011-11-16 LAB — PROTIME-INR: Prothrombin Time: 13.5 seconds (ref 11.6–15.2)

## 2011-11-16 NOTE — Patient Instructions (Addendum)
[address] [facility]  11/16/2011   Your procedure is scheduled on:  12/02/11  Enter through the Main Entrance of [facility] at 6 AM.  Pick up the phone at the desk and dial [phone].   Call this number if you have problems the morning of surgery: [phone]   Remember:   Do not eat food:After Midnight.  Do not drink clear liquids: After Midnight.  Take these medicines the morning of surgery with A SIP OF WATER: NA      Bring Inhaler day of surgery   Do not wear jewelry, make-up or nail polish.  Do not wear lotions, powders, or perfumes. You may wear deodorant.  Do not shave 48 hours prior to surgery.  Do not bring valuables to the hospital.  Contacts, dentures or bridgework may not be worn into surgery.  Leave suitcase in the car. After surgery it may be brought to your room.  For patients admitted to the hospital, checkout time is 11:00 AM the day of discharge.   Patients discharged the day of surgery will not be allowed to drive home.  Name and phone number of your driver: NA  Special Instructions: CHG Shower Use Special Wash: 1/2 bottle night before surgery and 1/2 bottle morning of surgery.   Please read over the following fact sheets that you were given: MRSA Information

## 2011-11-29 ENCOUNTER — Ambulatory Visit: Payer: PRIVATE HEALTH INSURANCE | Attending: Family Medicine | Admitting: Physical Therapy

## 2011-11-30 ENCOUNTER — Ambulatory Visit: Payer: PRIVATE HEALTH INSURANCE | Admitting: Physical Therapy

## 2011-12-01 ENCOUNTER — Other Ambulatory Visit: Payer: Self-pay | Admitting: Obstetrics and Gynecology

## 2011-12-01 MED ORDER — CEFAZOLIN SODIUM-DEXTROSE 2-3 GM-% IV SOLR
2.0000 g | INTRAVENOUS | Status: AC
  Administered 2011-12-02: 2 g via INTRAVENOUS
  Filled 2011-12-01: qty 50

## 2011-12-01 NOTE — H&P (Signed)
[name] Records is an 26 y.o. female. G0P0 noted on examination to have massive uterine fibroids consistent with a [redacted] week pregnant uterus. The patient presented in February 2013 complaining of pelvic pressure and pain as well as very heavy menses. On examination at that time she was noted to have 24 week size fibroids confirmed by ultrasound. At that time options of treatment were discusses with the patient and it was decided to try to use Depo Lupron therapy to arrest the growth of the fibroids and to halt the heavy vaginal bleeding. The depo Lupron was started but the pateint reported the side effects were intolerable to her and she wanted surgical therapy for the large fibroids. She is now being admitted for an abdominal myomectomy in an effort to preserve her fertility and remove the fibriods.   Pertinent Gynecological History: Menses: flow is excessive with use of 6 pads or tampons on heaviest days  Contraception: condoms DES exposure: denies Blood transfusions: none Sexually transmitted diseases: no past history Previous GYN Procedures: none  Last pap: normal Date: 08/19/2011 OB History: G0, P0     Past Medical History  Diagnosis Date  . Allergy     seasonal allergies  . Pelvic cyst     Seen by gynecologist Dr. Tenny Craw. Scheduled for ex lap on 12/02/11  . Pneumonia   . Depression     Past Surgical History  Procedure Date  . No past surgeries     No family history on file.  Social History:  reports that she has never smoked. She does not have any smokeless tobacco history on file. She reports that she drinks alcohol. She reports that she does not use illicit drugs.  Allergies:  Allergies  Allergen Reactions  . Penicillins Other (See Comments)    Unknown - childhood reaction    No prescriptions prior to admission    ROS  Respiratory: no cough, shortness of breath or hemoptasis GI: no nausea, vomiting or diarrhea GU: no dysuria, or urgency, positive for pressure and  frequency Gyn: cycles are regular and heavy with clots. No discharge or itch. Positive pelvic pressure and pain.    There were no vitals taken for this visit. Physical Exam  Afebrile  BP 122/70  Pulse 84  Respirations 16  Head; normocephalic and atraumatic Eyes; PERRLA Neck: Supple with no JVD, no enlargement of the thyroid, no adenopathy Chest: clear to P&A Heart: regular rhythm with no murmur or gallop Abdomen: soft, there is a large mass extending to 5 cm above umbilicus that is firm arising from the pelvis. There is no rebound or guarding Back: no CVA pain or deformity Pelvic Exam:                            External genitalia: WNL                           BUS: WNL                          Vagina: without lesion or significant discharge                           Cervix without lesion                          Uterus  is massively enlarged with multiple uterine fibroids consistent with 24 week size fibroids                            Adnexa can not be assessed due to massive fibroids  Neurologic exam grossly intact Skin without gross lesions No results found for this or any previous visit (from the past 24 hour(s)).  No results found.  Assessment/Plan:  Massive uterine fibroids in patient with no children  Plan: abdominal myomectomy  The risks and benefits of the procedure were discussed including risk of blood transfusion, infection, injury to adjacent structures and hysterectomy that would result in patient never being able to have children.  [name] 12/01/2011, 4:49 PM

## 2011-12-02 ENCOUNTER — Encounter (HOSPITAL_COMMUNITY): Payer: Self-pay

## 2011-12-02 ENCOUNTER — Encounter (HOSPITAL_COMMUNITY)
Admission: RE | Disposition: A | Discharge status: Home / Self Care | Payer: Self-pay | Source: Ambulatory Visit | Attending: Obstetrics and Gynecology

## 2011-12-02 ENCOUNTER — Encounter (HOSPITAL_COMMUNITY): Payer: Self-pay | Admitting: Anesthesiology

## 2011-12-02 ENCOUNTER — Inpatient Hospital Stay (HOSPITAL_COMMUNITY)
Admission: RE | Admit: 2011-12-02 | Discharge: 2011-12-06 | DRG: 743 | Disposition: A | Discharge status: Home / Self Care | Payer: PRIVATE HEALTH INSURANCE | Source: Ambulatory Visit | Attending: Obstetrics and Gynecology | Admitting: Obstetrics and Gynecology

## 2011-12-02 ENCOUNTER — Inpatient Hospital Stay (HOSPITAL_COMMUNITY): Payer: PRIVATE HEALTH INSURANCE | Admitting: Anesthesiology

## 2011-12-02 DIAGNOSIS — Z01812 Encounter for preprocedural laboratory examination: Secondary | ICD-10-CM

## 2011-12-02 DIAGNOSIS — D649 Anemia, unspecified: Secondary | ICD-10-CM | POA: Diagnosis not present

## 2011-12-02 DIAGNOSIS — D259 Leiomyoma of uterus, unspecified: Principal | ICD-10-CM | POA: Diagnosis present

## 2011-12-02 DIAGNOSIS — Z01818 Encounter for other preprocedural examination: Secondary | ICD-10-CM

## 2011-12-02 DIAGNOSIS — N949 Unspecified condition associated with female genital organs and menstrual cycle: Secondary | ICD-10-CM | POA: Diagnosis present

## 2011-12-02 HISTORY — PX: MYOMECTOMY: SHX85

## 2011-12-02 LAB — COMPREHENSIVE METABOLIC PANEL
ALT: 5 U/L (ref 0–35)
AST: 11 U/L (ref 0–37)
Albumin: 1.9 g/dL — ABNORMAL LOW (ref 3.5–5.2)
Calcium: 7.7 mg/dL — ABNORMAL LOW (ref 8.4–10.5)
Chloride: 105 mEq/L (ref 96–112)
Creatinine, Ser: 0.51 mg/dL (ref 0.50–1.10)
Sodium: 134 mEq/L — ABNORMAL LOW (ref 135–145)
Total Bilirubin: 0.9 mg/dL (ref 0.3–1.2)

## 2011-12-02 LAB — MAGNESIUM: Magnesium: 1.3 mg/dL — ABNORMAL LOW (ref 1.5–2.5)

## 2011-12-02 LAB — PREPARE RBC (CROSSMATCH)

## 2011-12-02 LAB — BASIC METABOLIC PANEL
BUN: 6 mg/dL (ref 6–23)
CO2: 28 mEq/L (ref 19–32)
GFR calc non Af Amer: >90 mL/min (ref >90)
Glucose, Bld: 108 mg/dL — ABNORMAL HIGH (ref 70–99)
Potassium: 3.9 mEq/L (ref 3.5–5.1)
Sodium: 130 mEq/L — ABNORMAL LOW (ref 135–145)

## 2011-12-02 LAB — HEMOGLOBIN: Hemoglobin: 11.2 g/dL — ABNORMAL LOW (ref 12.0–15.0)

## 2011-12-02 LAB — PREGNANCY, URINE: Preg Test, Ur: NEGATIVE

## 2011-12-02 SURGERY — MYOMECTOMY, ABDOMINAL APPROACH
Anesthesia: General | Site: Abdomen | Wound class: Clean Contaminated

## 2011-12-02 MED ORDER — LACTATED RINGERS IV SOLN: INTRAVENOUS | Status: DC

## 2011-12-02 MED ORDER — FENTANYL CITRATE 0.05 MG/ML IJ SOLN
INTRAMUSCULAR | Status: AC
  Filled 2011-12-02: qty 5

## 2011-12-02 MED ORDER — CEFAZOLIN SODIUM-DEXTROSE 2-3 GM-% IV SOLR
2.0000 g | Freq: Three times a day (TID) | INTRAVENOUS | Status: AC
  Administered 2011-12-02 – 2011-12-03 (×3): 2 g via INTRAVENOUS
  Filled 2011-12-02 (×3): qty 50

## 2011-12-02 MED ORDER — VASOPRESSIN 20 UNIT/ML IJ SOLN
INTRAVENOUS | Status: DC | PRN
  Administered 2011-12-02: 08:00:00 via INTRAMUSCULAR

## 2011-12-02 MED ORDER — HYDROMORPHONE HCL PF 1 MG/ML IJ SOLN
0.2500 mg | INTRAMUSCULAR | Status: DC | PRN
  Administered 2011-12-02 (×3): 0.5 mg via INTRAVENOUS

## 2011-12-02 MED ORDER — GLYCOPYRROLATE 0.2 MG/ML IJ SOLN
INTRAMUSCULAR | Status: DC | PRN
  Administered 2011-12-02: 0.6 mg via INTRAVENOUS

## 2011-12-02 MED ORDER — LIDOCAINE HCL (CARDIAC) 20 MG/ML IV SOLN
INTRAVENOUS | Status: AC
  Filled 2011-12-02: qty 5

## 2011-12-02 MED ORDER — MIDAZOLAM HCL 2 MG/2ML IJ SOLN
INTRAMUSCULAR | Status: AC
  Filled 2011-12-02: qty 2

## 2011-12-02 MED ORDER — ONDANSETRON HCL 4 MG/2ML IJ SOLN
INTRAMUSCULAR | Status: AC
  Filled 2011-12-02: qty 2

## 2011-12-02 MED ORDER — DIPHENHYDRAMINE HCL 50 MG/ML IJ SOLN: 12.5000 mg | Freq: Four times a day (QID) | INTRAMUSCULAR | Status: DC | PRN

## 2011-12-02 MED ORDER — OXYTOCIN 20 UNITS IN LACTATED RINGERS INFUSION - SIMPLE
125.0000 mL/h | INTRAVENOUS | Status: DC
  Administered 2011-12-02 (×2): 125 mL/h via INTRAVENOUS
  Filled 2011-12-02: qty 1000

## 2011-12-02 MED ORDER — SODIUM CHLORIDE 0.9 % IV SOLN
INTRAVENOUS | Status: DC | PRN
  Administered 2011-12-02: 09:00:00 via INTRAVENOUS

## 2011-12-02 MED ORDER — METOCLOPRAMIDE HCL 5 MG/ML IJ SOLN
INTRAMUSCULAR | Status: AC
  Filled 2011-12-02: qty 2

## 2011-12-02 MED ORDER — CEFAZOLIN SODIUM-DEXTROSE 2-3 GM-% IV SOLR
2.0000 g | Freq: Three times a day (TID) | INTRAVENOUS | Status: DC
  Filled 2011-12-02 (×3): qty 50

## 2011-12-02 MED ORDER — PROPOFOL 10 MG/ML IV EMUL
INTRAVENOUS | Status: DC | PRN
  Administered 2011-12-02: 180 mg via INTRAVENOUS

## 2011-12-02 MED ORDER — OXYCODONE-ACETAMINOPHEN 5-325 MG PO TABS
1.0000 | ORAL_TABLET | ORAL | Status: DC | PRN
  Administered 2011-12-03 (×3): 1 via ORAL
  Administered 2011-12-04 (×3): 2 via ORAL
  Administered 2011-12-04: 1 via ORAL
  Administered 2011-12-05 (×2): 2 via ORAL
  Administered 2011-12-06: 1 via ORAL
  Filled 2011-12-02 (×2): qty 2
  Filled 2011-12-02: qty 1
  Filled 2011-12-02 (×2): qty 2
  Filled 2011-12-02 (×3): qty 1
  Filled 2011-12-02 (×2): qty 2
  Filled 2011-12-02: qty 1

## 2011-12-02 MED ORDER — CALCIUM GLUCONATE 10 % IV SOLN
1.0000 g | Freq: Once | INTRAVENOUS | Status: AC
  Administered 2011-12-02: 1 g via INTRAVENOUS
  Filled 2011-12-02: qty 10

## 2011-12-02 MED ORDER — MEPERIDINE HCL 25 MG/ML IJ SOLN: 6.2500 mg | INTRAMUSCULAR | Status: DC | PRN

## 2011-12-02 MED ORDER — ONDANSETRON HCL 4 MG/2ML IJ SOLN
INTRAMUSCULAR | Status: DC | PRN
  Administered 2011-12-02: 4 mg via INTRAVENOUS

## 2011-12-02 MED ORDER — ROCURONIUM BROMIDE 50 MG/5ML IV SOLN
INTRAVENOUS | Status: AC
  Filled 2011-12-02: qty 1

## 2011-12-02 MED ORDER — PHENYLEPHRINE HCL 10 MG/ML IJ SOLN
INTRAMUSCULAR | Status: DC | PRN
  Administered 2011-12-02 (×13): 80 ug via INTRAVENOUS
  Administered 2011-12-02: 40 ug via INTRAVENOUS
  Administered 2011-12-02 (×3): 80 ug via INTRAVENOUS
  Administered 2011-12-02: 40 ug via INTRAVENOUS
  Administered 2011-12-02 (×3): 80 ug via INTRAVENOUS
  Administered 2011-12-02 (×2): 40 ug via INTRAVENOUS
  Administered 2011-12-02: 80 ug via INTRAVENOUS
  Administered 2011-12-02: 40 ug via INTRAVENOUS
  Administered 2011-12-02: 80 ug via INTRAVENOUS
  Administered 2011-12-02 (×3): 40 ug via INTRAVENOUS
  Administered 2011-12-02 (×5): 80 ug via INTRAVENOUS

## 2011-12-02 MED ORDER — LIDOCAINE HCL (CARDIAC) 20 MG/ML IV SOLN
INTRAVENOUS | Status: DC | PRN
  Administered 2011-12-02: 80 mg via INTRAVENOUS

## 2011-12-02 MED ORDER — NEOSTIGMINE METHYLSULFATE 1 MG/ML IJ SOLN
INTRAMUSCULAR | Status: DC | PRN
  Administered 2011-12-02: 3 mg via INTRAVENOUS

## 2011-12-02 MED ORDER — MENTHOL 3 MG MT LOZG: 1.0000 | LOZENGE | OROMUCOSAL | Status: DC | PRN

## 2011-12-02 MED ORDER — CALCIUM GLUCONATE 10 % IV SOLN
INTRAVENOUS | Status: AC
  Administered 2011-12-02: 1000 mg via INTRAVENOUS
  Filled 2011-12-02: qty 10

## 2011-12-02 MED ORDER — DIPHENHYDRAMINE HCL 12.5 MG/5ML PO ELIX
12.5000 mg | ORAL_SOLUTION | Freq: Four times a day (QID) | ORAL | Status: DC | PRN
  Administered 2011-12-02: 12.5 mg via ORAL
  Filled 2011-12-02: qty 5

## 2011-12-02 MED ORDER — SCOPOLAMINE 1 MG/3DAYS TD PT72
1.0000 | MEDICATED_PATCH | TRANSDERMAL | Status: DC
  Administered 2011-12-02: 1.5 mg via TRANSDERMAL

## 2011-12-02 MED ORDER — SCOPOLAMINE 1 MG/3DAYS TD PT72
MEDICATED_PATCH | TRANSDERMAL | Status: AC
  Filled 2011-12-02: qty 1

## 2011-12-02 MED ORDER — DEXAMETHASONE SODIUM PHOSPHATE 4 MG/ML IJ SOLN
INTRAMUSCULAR | Status: DC | PRN
  Administered 2011-12-02: 5 mg via INTRAVENOUS

## 2011-12-02 MED ORDER — PHENYLEPHRINE 40 MCG/ML (10ML) SYRINGE FOR IV PUSH (FOR BLOOD PRESSURE SUPPORT)
PREFILLED_SYRINGE | INTRAVENOUS | Status: AC
  Filled 2011-12-02: qty 35

## 2011-12-02 MED ORDER — NALOXONE HCL 0.4 MG/ML IJ SOLN: 0.4000 mg | INTRAMUSCULAR | Status: DC | PRN

## 2011-12-02 MED ORDER — HYDROMORPHONE HCL PF 1 MG/ML IJ SOLN
INTRAMUSCULAR | Status: AC
  Filled 2011-12-02: qty 1

## 2011-12-02 MED ORDER — FENTANYL CITRATE 0.05 MG/ML IJ SOLN
INTRAMUSCULAR | Status: DC | PRN
  Administered 2011-12-02: 50 ug via INTRAVENOUS
  Administered 2011-12-02: 100 ug via INTRAVENOUS
  Administered 2011-12-02 (×3): 50 ug via INTRAVENOUS
  Administered 2011-12-02: 100 ug via INTRAVENOUS
  Administered 2011-12-02 (×2): 50 ug via INTRAVENOUS

## 2011-12-02 MED ORDER — PROPOFOL 10 MG/ML IV EMUL
INTRAVENOUS | Status: AC
  Filled 2011-12-02: qty 20

## 2011-12-02 MED ORDER — METOCLOPRAMIDE HCL 5 MG/ML IJ SOLN: 10.0000 mg | Freq: Once | INTRAMUSCULAR | Status: DC | PRN

## 2011-12-02 MED ORDER — SODIUM CHLORIDE 0.9 % IV SOLN: 0.5000 mg/kg/h | INTRAVENOUS | Status: DC

## 2011-12-02 MED ORDER — LACTATED RINGERS IV SOLN
INTRAVENOUS | Status: DC
  Administered 2011-12-02 (×3): via INTRAVENOUS

## 2011-12-02 MED ORDER — HYDROMORPHONE 0.3 MG/ML IV SOLN
INTRAVENOUS | Status: DC
  Administered 2011-12-02: 1.8 mg via INTRAVENOUS
  Administered 2011-12-02: 0.9 mg via INTRAVENOUS
  Administered 2011-12-02: 0.3 mL via INTRAVENOUS
  Administered 2011-12-03: 0.9 mg via INTRAVENOUS
  Filled 2011-12-02: qty 25

## 2011-12-02 MED ORDER — NEOSTIGMINE METHYLSULFATE 1 MG/ML IJ SOLN
INTRAMUSCULAR | Status: AC
  Filled 2011-12-02: qty 10

## 2011-12-02 MED ORDER — HYDROMORPHONE HCL PF 1 MG/ML IJ SOLN: 0.2500 mg | INTRAMUSCULAR | Status: DC | PRN

## 2011-12-02 MED ORDER — IBUPROFEN 600 MG PO TABS
600.0000 mg | ORAL_TABLET | Freq: Four times a day (QID) | ORAL | Status: DC | PRN
  Administered 2011-12-04: 600 mg via ORAL
  Filled 2011-12-02 (×2): qty 1

## 2011-12-02 MED ORDER — ROCURONIUM BROMIDE 100 MG/10ML IV SOLN
INTRAVENOUS | Status: DC | PRN
  Administered 2011-12-02 (×4): 10 mg via INTRAVENOUS
  Administered 2011-12-02: 35 mg via INTRAVENOUS
  Administered 2011-12-02: 5 mg via INTRAVENOUS

## 2011-12-02 MED ORDER — OXYTOCIN 20 UNITS IN LACTATED RINGERS INFUSION - SIMPLE
INTRAVENOUS | Status: AC
  Filled 2011-12-02: qty 1000

## 2011-12-02 MED ORDER — PHENYLEPHRINE 40 MCG/ML (10ML) SYRINGE FOR IV PUSH (FOR BLOOD PRESSURE SUPPORT)
PREFILLED_SYRINGE | INTRAVENOUS | Status: AC
  Filled 2011-12-02: qty 25

## 2011-12-02 MED ORDER — MIDAZOLAM HCL 5 MG/5ML IJ SOLN
INTRAMUSCULAR | Status: DC | PRN
  Administered 2011-12-02: 2 mg via INTRAVENOUS

## 2011-12-02 MED ORDER — GLYCOPYRROLATE 0.2 MG/ML IJ SOLN
INTRAMUSCULAR | Status: AC
  Filled 2011-12-02: qty 1

## 2011-12-02 MED ORDER — VASOPRESSIN 20 UNIT/ML IJ SOLN
INTRAMUSCULAR | Status: AC
  Filled 2011-12-02: qty 1

## 2011-12-02 SURGICAL SUPPLY — 35 items
BARRIER ADHS 3X4 INTERCEED (GAUZE/BANDAGES/DRESSINGS) ×2 IMPLANT
BLADE SURG 10 STRL SS (BLADE) ×1 IMPLANT
BRR ADH 4X3 ABS CNTRL BYND (GAUZE/BANDAGES/DRESSINGS) ×2
CANISTER SUCTION 2500CC (MISCELLANEOUS) ×2 IMPLANT
CLOTH BEACON ORANGE TIMEOUT ST (SAFETY) ×2 IMPLANT
CONT PATH 165OZ SNAP LID (FORM) ×1 IMPLANT
CONT PATH 16OZ SNAP LID 3702 (MISCELLANEOUS) ×1 IMPLANT
CONT SPEC PATH 64OZ SNAP LID (MISCELLANEOUS) IMPLANT
DECANTER SPIKE VIAL GLASS SM (MISCELLANEOUS) ×2 IMPLANT
DRESSING TELFA 8X3 (GAUZE/BANDAGES/DRESSINGS) ×1 IMPLANT
GAUZE SPONGE 4X4 12PLY STRL LF (GAUZE/BANDAGES/DRESSINGS) ×1 IMPLANT
GAUZE SPONGE 4X4 16PLY XRAY LF (GAUZE/BANDAGES/DRESSINGS) ×4 IMPLANT
GLOVE BIO SURGEON STRL SZ7.5 (GLOVE) ×4 IMPLANT
GOWN PREVENTION PLUS LG XLONG (DISPOSABLE) ×4 IMPLANT
GOWN PREVENTION PLUS XXLARGE (GOWN DISPOSABLE) ×2 IMPLANT
NDL HYPO 25X1 1.5 SAFETY (NEEDLE) ×1 IMPLANT
NEEDLE HYPO 25X1 1.5 SAFETY (NEEDLE) ×2 IMPLANT
NS IRRIG 1000ML POUR BTL (IV SOLUTION) ×2 IMPLANT
PACK ABDOMINAL GYN (CUSTOM PROCEDURE TRAY) ×2 IMPLANT
PAD ABD 7.5X8 STRL (GAUZE/BANDAGES/DRESSINGS) ×1 IMPLANT
PAD OB MATERNITY 4.3X12.25 (PERSONAL CARE ITEMS) ×2 IMPLANT
SPONGE LAP 18X18 X RAY DECT (DISPOSABLE) ×8 IMPLANT
STAPLER VISISTAT 35W (STAPLE) ×2 IMPLANT
SUT PDS AB 0 CTX 60 (SUTURE) ×2 IMPLANT
SUT PLAIN 2 0 XLH (SUTURE) ×1 IMPLANT
SUT VIC AB 0 CT1 18XCR BRD8 (SUTURE) ×3 IMPLANT
SUT VIC AB 0 CT1 27 (SUTURE) ×22
SUT VIC AB 0 CT1 27XBRD ANBCTR (SUTURE) ×3 IMPLANT
SUT VIC AB 0 CT1 8-18 (SUTURE) ×12
SUT VICRYL 0 TIES 12 18 (SUTURE) ×2 IMPLANT
SYR CONTROL 10ML LL (SYRINGE) ×2 IMPLANT
TAPE CLOTH SURG 4X10 WHT LF (GAUZE/BANDAGES/DRESSINGS) ×1 IMPLANT
TOWEL OR 17X24 6PK STRL BLUE (TOWEL DISPOSABLE) ×4 IMPLANT
TRAY FOLEY CATH 14FR (SET/KITS/TRAYS/PACK) ×2 IMPLANT
WATER STERILE IRR 1000ML POUR (IV SOLUTION) ×2 IMPLANT

## 2011-12-02 NOTE — Anesthesia Postprocedure Evaluation (Signed)
  Anesthesia Post-op Note  Patient: [name]  Procedure(s) Performed: Procedure(s) (LRB): MYOMECTOMY (N/A)  Patient Location: PACU  Anesthesia Type: General  Level of Consciousness: awake, alert  and oriented  Airway and Oxygen Therapy: Patient Spontanous Breathing  Post-op Pain: 3 /10, mild  Post-op Assessment: Post-op Vital signs reviewed, Patient's Cardiovascular Status Stable, Respiratory Function Stable, Patent Airway, No signs of Nausea or vomiting and Pain level controlled  Post-op Vital Signs: Reviewed and stable  Complications: No apparent anesthesia complications

## 2011-12-02 NOTE — Progress Notes (Signed)
Labs shown to Dr Malen Gauze, okay to dc from [employer]

## 2011-12-02 NOTE — Transfer of Care (Signed)
Immediate Anesthesia Transfer of Care Note  Patient: [name]  Procedure(s) Performed: Procedure(s) (LRB): MYOMECTOMY (N/A)  Patient Location: PACU  Anesthesia Type: General  Level of Consciousness: awake  Airway & Oxygen Therapy: Patient Spontanous Breathing and Patient connected to nasal cannula oxygen  Post-op Assessment: Report given to PACU RN and Post -op Vital signs reviewed and stable  Post vital signs: stable  Complications: No apparent anesthesia complications, pt. Having unifocal PVC's.  Dr. Malen Gauze notified.  BP stable.  Ca Gluconate ordered.  Labs pending.  FFP pending.

## 2011-12-02 NOTE — Op Note (Signed)
NAMEYESENIA, [name]              ACCOUNT NO.:  [id number]  MEDICAL RECORD NO.:  [id number]  LOCATION:  [facility]                          FACILITY:  [facility]  PHYSICIAN:  Miguel Aschoff, M.D.       DATE OF BIRTH:  Apr 20, 1986  DATE OF PROCEDURE:  12/02/2011 DATE OF DISCHARGE:                              OPERATIVE REPORT   PREOPERATIVE DIAGNOSIS:  Massive uterine fibroids.  POSTOPERATIVE DIAGNOSIS:  Massive uterine fibroids.  PROCEDURE:  Abdominal myomectomy.  SURGEON:  Miguel Aschoff, MD  ASSISTANT:  Philip Aspen, DO  ANESTHESIA:  General.  COMPLICATIONS:  None.  JUSTIFICATION:  The patient is a 26 year old black female, noted to have massive uterine fibroids approximately 24-28 weeks equivalent size, confirmed by ultrasound with several fibroids being 10 cm in size. Because the symptoms associated with the fibroids, pelvic pressure and pain, and abdominal pain and distention and with the patient having no children, options were discussed with the patient including Depot Lupron therapy followed by myomectomy.  The patient had been placed on Depot Lupron from the side effects intolerable and presents now to go to the operating room to undergo abdominal myomectomy.  The risks and benefits of the procedure were discussed with the patient including the risk of blood transfusion, infection, and possible hysterectomy as well as injury to adjacent structures.  With this information, informed consent has been obtained.  PROCEDURE:  The patient was taken to the operative room, placed in the supine position.  General anesthesia was administered without difficulty.  She was then placed in modified supine position.  Foley catheter was inserted.  The abdomen was prepped and draped in usual sterile fashion.  Examination at this point again revealed large massive uterine fibroid against extending well above the umbilicus.  A midline incision was made extended out through the subcutaneous tissue  with bleeding points being clamped and coagulated as they were encountered. The fascia was then identified and incised vertically.  The midline was then found and entered carefully avoiding the underlying structures.  At this point, the incision was extended to allow delivery of the uterus through the abdominal incision.  Again, the uterus was found to have multiple large and small uterine fibroids throughout the fundus, posterior surface, anterior surface, broad ligament area, the uterus was covered with fibroids.  The largest fibroids were identified.  The uterus was elevated, surface over the fibroids was injected with dilute Pitressin solution.  This was incised and then dissection proceeded with removal of these large fibroids.  It required several skin incisions through the uterine serosa to remove the bulk of the fibroids.  All of the larger mass of fibroids enlarging the uterus were removed without difficulty.  The defects created by the myomectomies were then closed using multiple interrupted 0 Vicryl sutures, the first closed the base of the defect and then in the more superficial areas of defect.  When the bulk of the fibroids had been removed, it was elected at this point to try to reconstruct the uterus into a more normal configuration. After removal of these large fibroids, the excess myometrium was excised in an effort to restore the uterus to in essence in normal appearance with  the excess myometrium being excised.  Again, the deeper areas were closed using interrupted 0 Vicryl sutures.  The more superficial areas were closed using running interlocking 0 Vicryl suture.  There was a large amount of bleeding because of these fibroids and it was necessary to transfuse the patient with 4 units of packed cells during this procedure.  The estimated blood loss was approximately 2500 mL with bleeding brought under control and the uterus restored to normal configuration.  Lap  counts and instrument counts were taken and found to be correct.  Interceed was placed over the uterine incision in an effort to prevent adhesion formation.  During the procedure, care was taken to avoid any injury to the fallopian tubes.  Once this was done, the lap counts and instrument counts taken and found to be correct.  The parietal peritoneum was closed using running interlocking 0 Vicryl suture.  The fascia was closed using 0 PDS double-stranded suture.  The subcutaneous tissue was closed using interrupted 0 Vicryl sutures.  The skin incision was closed using staples.  Again, the estimated blood loss was approximately 2500 mL.  The patient was transfused with 4 units of packed cells.  Hemoglobin will be obtained in the recovery room.  As a precaution, the patient will be observed in the AICU overnight to ensure that she remained hemostatically stable.     Miguel Aschoff, M.D.     AR/MEDQ  D:  12/02/2011  T:  12/02/2011  Job:  [id number]

## 2011-12-02 NOTE — Anesthesia Postprocedure Evaluation (Signed)
  Anesthesia Post-op Note  Patient: [name]  Procedure(s) Performed: Procedure(s) (LRB): MYOMECTOMY (N/A)  Patient Location: [facility]  Anesthesia Type: General  Level of Consciousness: awake, alert  and oriented  Airway and Oxygen Therapy: Patient Spontanous Breathing  Post-op Pain: none  Post-op Assessment: Post-op Vital signs reviewed and Patient's Cardiovascular Status Stable  Post-op Vital Signs: Reviewed and stable  Complications: No apparent anesthesia complications

## 2011-12-02 NOTE — Anesthesia Preprocedure Evaluation (Signed)
Anesthesia Evaluation  Patient identified by MRN, date of birth, ID band Patient awake    Reviewed: Allergy & Precautions, H&P , NPO status , Patient's Chart, lab work & pertinent test results  Airway Mallampati: II TM Distance: >3 FB Neck ROM: full    Dental No notable dental hx. (+) Teeth Intact   Pulmonary asthma ,  breath sounds clear to auscultation  Pulmonary exam normal       Cardiovascular negative cardio ROS  Rhythm:regular Rate:Normal     Neuro/Psych PSYCHIATRIC DISORDERS Depression    GI/Hepatic negative GI ROS,   Endo/Other  negative endocrine ROS  Renal/GU negative Renal ROS     Musculoskeletal   Abdominal Normal abdominal exam  (+)   Peds  Hematology negative hematology ROS (+)   Anesthesia Other Findings   Reproductive/Obstetrics                           Anesthesia Physical Anesthesia Plan  ASA: II  Anesthesia Plan: General ETT   Post-op Pain Management:    Induction:   Airway Management Planned:   Additional Equipment:   Intra-op Plan:   Post-operative Plan:   Informed Consent: I have reviewed the patients History and Physical, chart, labs and discussed the procedure including the risks, benefits and alternatives for the proposed anesthesia with the patient or authorized representative who has indicated his/her understanding and acceptance.   Dental Advisory Given  Plan Discussed with: Anesthesiologist, Surgeon and CRNA  Anesthesia Plan Comments:         Anesthesia Quick Evaluation

## 2011-12-02 NOTE — Progress Notes (Signed)
Labs shown to dr Tenny Craw no new orders recieved

## 2011-12-02 NOTE — Progress Notes (Signed)
Dr Tenny Craw by to see pt, discussed abdominal swelling, Pitocin added to ivf will continue to monitor and transfer to icu

## 2011-12-02 NOTE — Brief Op Note (Signed)
12/02/2011  10:03 AM  PATIENT:  [name]  26 y.o. female  PRE-OPERATIVE DIAGNOSIS:  FIBROIDS  POST-OPERATIVE DIAGNOSIS:  Fibroids  PROCEDURE:  Procedure(s) (LRB): MYOMECTOMY (N/A)  SURGEON:  Surgeon(s) and Role:    * Miguel Aschoff, MD - Primary    * Philip Aspen, DO - Assisting   ANESTHESIA:   general  EBL:  Total I/O In: 4700 [I.V.:3300; Blood:1400] Out: 2900 [Urine:400; Blood:2500]  BLOOD ADMINISTERED:4 units CC PRBC  DRAINS: none   LOCAL MEDICATIONS USED:  OTHER Dilute pitressin solution  SPECIMEN:  Source of Specimen:  Uterine fibroids  DISPOSITION OF SPECIMEN:  PATHOLOGY  COUNTS:  YES  TOURNIQUET:  * No tourniquets in log *  DICTATION: .Other Dictation: Dictation Number [id number]  PLAN OF CARE: Admit to inpatient   PATIENT DISPOSITION:  ICU - extubated and stable.   Delay start of Pharmacological VTE agent (>24hrs) due to surgical blood loss or risk of bleeding: PAS hose

## 2011-12-02 NOTE — Addendum Note (Signed)
Addendum  created 12/02/11 1456 by Shanon Payor, CRNA   Modules edited:Notes Section

## 2011-12-02 NOTE — H&P (Signed)
Status unchanged will proceed with planned abdominal myomectomy

## 2011-12-03 LAB — CBC
HCT: 24.6 % — ABNORMAL LOW (ref 36.0–46.0)
Hemoglobin: 9 g/dL — ABNORMAL LOW (ref 12.0–15.0)
MCH: 31.7 pg (ref 26.0–34.0)
MCHC: 36.6 g/dL — ABNORMAL HIGH (ref 30.0–36.0)
MCV: 86.6 fL (ref 78.0–100.0)
RBC: 2.84 MIL/uL — ABNORMAL LOW (ref 3.87–5.11)

## 2011-12-03 LAB — PREPARE FRESH FROZEN PLASMA

## 2011-12-03 MED ORDER — NALOXONE HCL 0.4 MG/ML IJ SOLN: 0.4000 mg | INTRAMUSCULAR | Status: DC | PRN

## 2011-12-03 MED ORDER — ONDANSETRON HCL 4 MG/2ML IJ SOLN: 4.0000 mg | Freq: Four times a day (QID) | INTRAMUSCULAR | Status: DC | PRN

## 2011-12-03 MED ORDER — MORPHINE SULFATE (PF) 1 MG/ML IV SOLN
INTRAVENOUS | Status: DC
  Administered 2011-12-03: 16.5 mg via INTRAVENOUS
  Administered 2011-12-03 (×2): via INTRAVENOUS
  Administered 2011-12-03: 1.5 mg via INTRAVENOUS
  Administered 2011-12-03: 7.5 mg via INTRAVENOUS
  Filled 2011-12-03 (×2): qty 25

## 2011-12-03 MED ORDER — MAGNESIUM SULFATE IN D5W 10-5 MG/ML-% IV SOLN: 1.0000 g | Freq: Once | INTRAVENOUS | Status: DC

## 2011-12-03 MED ORDER — SODIUM CHLORIDE 0.9 % IV SOLN
Freq: Once | INTRAVENOUS | Status: AC
  Administered 2011-12-03: 01:00:00 via INTRAVENOUS
  Filled 2011-12-03: qty 100

## 2011-12-03 MED ORDER — MAGNESIUM SULFATE IN D5W 10-5 MG/ML-% IV SOLN
1.0000 g | Freq: Once | INTRAVENOUS | Status: DC
  Filled 2011-12-03: qty 100

## 2011-12-03 MED ORDER — LACTATED RINGERS IV SOLN
INTRAVENOUS | Status: DC
  Administered 2011-12-03 – 2011-12-04 (×3): via INTRAVENOUS

## 2011-12-03 MED ORDER — METHYLERGONOVINE MALEATE 0.2 MG PO TABS
0.2000 mg | ORAL_TABLET | Freq: Three times a day (TID) | ORAL | Status: DC
  Administered 2011-12-03 – 2011-12-06 (×8): 0.2 mg via ORAL
  Filled 2011-12-03 (×8): qty 1

## 2011-12-03 MED ORDER — NALBUPHINE SYRINGE 5 MG/0.5 ML
5.0000 mg | INJECTION | Freq: Once | INTRAMUSCULAR | Status: DC
  Filled 2011-12-03: qty 0.5

## 2011-12-03 MED ORDER — DIPHENHYDRAMINE HCL 50 MG/ML IJ SOLN: 12.5000 mg | Freq: Four times a day (QID) | INTRAMUSCULAR | Status: DC | PRN

## 2011-12-03 MED ORDER — DIPHENHYDRAMINE HCL 12.5 MG/5ML PO ELIX
12.5000 mg | ORAL_SOLUTION | Freq: Four times a day (QID) | ORAL | Status: DC | PRN
  Filled 2011-12-03: qty 5

## 2011-12-03 MED ORDER — NALBUPHINE SYRINGE 5 MG/0.5 ML
5.0000 mg | INJECTION | Freq: Once | INTRAMUSCULAR | Status: AC
  Administered 2011-12-03: 5 mg via INTRAVENOUS
  Filled 2011-12-03: qty 0.5

## 2011-12-03 MED ORDER — METHYLPREDNISOLONE SODIUM SUCC 125 MG IJ SOLR
125.0000 mg | Freq: Once | INTRAMUSCULAR | Status: AC
  Administered 2011-12-03: 125 mg via INTRAVENOUS
  Filled 2011-12-03: qty 2

## 2011-12-03 MED ORDER — KETOROLAC TROMETHAMINE 30 MG/ML IJ SOLN
30.0000 mg | Freq: Four times a day (QID) | INTRAMUSCULAR | Status: DC | PRN
Start: 2011-12-03 — End: 2011-12-05
  Administered 2011-12-03 – 2011-12-04 (×3): 30 mg via INTRAVENOUS
  Filled 2011-12-03 (×3): qty 1

## 2011-12-03 MED ORDER — FLUOXETINE HCL 20 MG PO CAPS
20.0000 mg | ORAL_CAPSULE | Freq: Every day | ORAL | Status: DC
  Administered 2011-12-03 – 2011-12-06 (×4): 20 mg via ORAL
  Filled 2011-12-03 (×4): qty 1

## 2011-12-03 MED ORDER — SODIUM CHLORIDE 0.9 % IJ SOLN: 9.0000 mL | INTRAMUSCULAR | Status: DC | PRN

## 2011-12-03 MED ORDER — DIPHENHYDRAMINE HCL 50 MG/ML IJ SOLN: 50.0000 mg | Freq: Three times a day (TID) | INTRAMUSCULAR | Status: DC | PRN

## 2011-12-03 NOTE — Progress Notes (Signed)
Patient has been stable but reported being dizzy when going to bathroom.  H/H at 4:00 Pm revealed drop of hemoglobin to 8.2. In view of these symptoms and the large blood loss at time of surgery will give the patient 2 additional units of packed cells. I have discussed this with the patient and risks and benefits were discussed.

## 2011-12-03 NOTE — Progress Notes (Signed)
eLink Physician-Brief Progress Note Patient Name: [name] DOB: 1986/02/12 MRN: [id number]  Date of Service  12/03/2011   HPI/Events of Note  Nurse concerned about an allergic reaction. Pt received Nubain and Mg IV following which she started to have itchiness in the body and chest tightness.  On camera, she looks comfortable with no evidence of angioedema. Her RR is wnl with normal work of breathing.   eICU Interventions  - Hold Nubain and Mg supplements - Benadryl IV - Methylprednisone IV X1 dose.      Catha Brow 12/03/2011, 2:26 AM

## 2011-12-03 NOTE — Progress Notes (Signed)
Dr. Dareen Piano is notified about pt's H & H result, no orders were received in this regard. The lab also showed Mg 1.4, pt alternates between trigeminy and multiple random PVCs, order received to give 1 gm Magnesium Sulfate. Dr. Dareen Piano also approved NPO diet with sips of water. Pt's VSs are stable, no signs or symptoms of bleeding.

## 2011-12-03 NOTE — Progress Notes (Signed)
Ptcontinues to complain about itching all over. Benadryl prn was given earlier with minimum relief. Dr. Dareen Piano was called and made aware of that. Order received to give Nubain 5 mg IV X 1, may repeat x 1 if no relief from the first dose. If itching persists, ok to change PCA dilaudid to PCA Morphine full dose.

## 2011-12-03 NOTE — Progress Notes (Signed)
S: Stable following abdominal myomectomy with significant blood loss estimated to be 2500. Has received 4 units of packed cells and FFP. Has had uncomplicated night.  O: Afebrile  BP 133/70  Pulse 111 Respirations 28  Abdomen is soft and non distended. Dressing is dry.   Labs: Hgb  9.0 this AM  WBC 14.2    Calcium 8.6 after calcium gluconate X's 2  A: Stable S/P myomectomy with post op anemia. Will D/C foley, ambulate, increase diet. Transfer out of ICU . Repeat H/H at 4:00 PM Convert to PO pain meds with Toradol and Percocet Reassess this PM

## 2011-12-04 LAB — CBC
HCT: 28.6 % — ABNORMAL LOW (ref 36.0–46.0)
Hemoglobin: 9.9 g/dL — ABNORMAL LOW (ref 12.0–15.0)
MCV: 89.4 fL (ref 78.0–100.0)
RDW: 15.5 % (ref 11.5–15.5)
WBC: 12.7 10*3/uL — ABNORMAL HIGH (ref 4.0–10.5)

## 2011-12-04 LAB — TYPE AND SCREEN
ABO/RH(D): B POS
Antibody Screen: NEGATIVE
Unit division: 0
Unit division: 0
Unit division: 0

## 2011-12-04 MED ORDER — SODIUM CHLORIDE 0.9 % IJ SOLN
3.0000 mL | Freq: Two times a day (BID) | INTRAMUSCULAR | Status: DC
  Administered 2011-12-04 – 2011-12-05 (×3): 3 mL via INTRAVENOUS

## 2011-12-04 MED ORDER — DIPHENHYDRAMINE HCL 25 MG PO CAPS
50.0000 mg | ORAL_CAPSULE | Freq: Four times a day (QID) | ORAL | Status: DC | PRN
  Administered 2011-12-04 – 2011-12-05 (×2): 50 mg via ORAL
  Filled 2011-12-04 (×2): qty 1
  Filled 2011-12-04: qty 2

## 2011-12-04 MED ORDER — SODIUM CHLORIDE 0.9 % IJ SOLN
3.0000 mL | INTRAMUSCULAR | Status: DC | PRN
  Administered 2011-12-04: 3 mL via INTRAVENOUS

## 2011-12-04 MED ORDER — ONDANSETRON HCL 4 MG/2ML IJ SOLN
4.0000 mg | Freq: Three times a day (TID) | INTRAMUSCULAR | Status: DC | PRN
  Administered 2011-12-04: 4 mg via INTRAVENOUS
  Filled 2011-12-04: qty 2

## 2011-12-04 NOTE — Progress Notes (Signed)
S: Stable this Am with moderate post op pain. Now on toradol and percocet.Received 2 more units of packed cells last PM  O: Temp 98.3   BP 122/57  Pulse 85  Abdomen is soft but is enlarged halfway to umbilicus. Wound is clean and dry  Hgb. 9.9 after transfusion  WBC 12.7  A: Stable S/P myomectomy for massive fibroids.  Plan: Transfer to Gyn nursing unit          Patient may shower          Continue Methergine 0.2 Q6 Hours           Hep Lock IV

## 2011-12-04 NOTE — Progress Notes (Signed)
1320 Report to Jerrell Mylar, RN for transfer to rm [id number] 1330 Pt ambulated to rm [id number] - Kim updated.

## 2011-12-05 ENCOUNTER — Encounter (HOSPITAL_COMMUNITY): Payer: Self-pay | Admitting: Obstetrics and Gynecology

## 2011-12-05 MED ORDER — KETOROLAC TROMETHAMINE 10 MG PO TABS
10.0000 mg | ORAL_TABLET | Freq: Four times a day (QID) | ORAL | Status: DC
  Administered 2011-12-05 (×2): 10 mg via ORAL
  Filled 2011-12-05 (×5): qty 1

## 2011-12-05 MED ORDER — BISACODYL 5 MG PO TBEC
5.0000 mg | DELAYED_RELEASE_TABLET | Freq: Every day | ORAL | Status: DC | PRN
Start: 2011-12-05 — End: 2011-12-06
  Filled 2011-12-05: qty 1

## 2011-12-05 NOTE — Progress Notes (Signed)
S: Reports incisional soreness which became worse last night. No BM yet still having pruritis from the pain meds. Still only able to eat very light. Can not eat her regular tray yet but is able to take down grapes.  O: Afebrile  BP 117/76  Pulse 68  Abdomen is soft would clean and dry fundus still about 1/3 to umbilicus  A: Stable but with significant incisional pain, poor intake and no BM yet. S/P myomectomy with 6 units of blood transfuses. Do not feel this patient is ready for discharge.  P: Keep in house today       Dulcolax tab        Toradol 10 mgs po Q4 hour prn pain      Reassess in this PM

## 2011-12-06 NOTE — Progress Notes (Signed)
Patient c/o weakness.  Encouraged to ambulate with assist, instructed on Iron rich foods, and to call for assistance when needed

## 2011-12-06 NOTE — Progress Notes (Signed)
S: Reports feeling weak this morning. Patient is passing gas but no BM yet.  Afebrile      BP 118/73       Pulse 77   Respirations 18  Abdomen is soft wound is healing well BS positive  Pathology  32 benign fibroids at approximately 1200 grams total  A: Stable but reports feeling weak  Plan: Will get H/H now if greater than 8:0 will D/C home if less that 8.0 with symptoms of weakness would transfuse again with 2 units/  Will reassess once H/H is back

## 2011-12-06 NOTE — Discharge Instructions (Signed)
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

## 2011-12-06 NOTE — Progress Notes (Signed)
UR Chart review completed.  

## 2011-12-06 NOTE — Progress Notes (Signed)
Discharge note:  Patient's hemoglobin this AM was 10.1. Will discharge patient home and see back in office on December 09, 2011  Patient was instructed to do no heavy lifting, nothing per vagina for 6 weeks. She is to call if there are any problems such as fever severe pain or heavy bleeding or problems with her incision.  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 She was also told that due to the large volume of the uterus which was incised to remove these massive fibroids that she should not labor and should have a C/section as her mode of delivery.  Meds for home Tylox one every 3 hours if needed for pain.  She is to resume her prozac  Condition on discharge was improved  Regular Diet  Final DX: Massive uterine Fibroids.

## 2011-12-13 NOTE — Discharge Summary (Signed)
[name], [name]              ACCOUNT NO.:  [id number]  MEDICAL RECORD NO.:  [id number]  LOCATION:  9303                          FACILITY:  [facility]  PHYSICIAN:  Miguel Aschoff, M.D.       DATE OF BIRTH:  May 31, 1986  DATE OF ADMISSION:  12/02/2011 DATE OF DISCHARGE:  12/06/2011                              DISCHARGE SUMMARY   DIAGNOSIS:  Massive uterine fibroids.  FINAL DIAGNOSIS:  Massive uterine fibroids.  OPERATIONS AND PROCEDURES:  Abdominal myomectomy, blood transfusions, general anesthesia.  BRIEF HISTORY:  The patient is a 26 year old black female, gravida 0 para 0, who presents to the office for evaluation of heavy menses and abdominal pressure, was noted to have 24-26 week size uterine fibroids. The options of treatment had been discussed with the patient including a trial of Lupron Depot therapy.  The patient was initially started on this medication.  However, because of side effects and nonresolution of her symptoms, the patient had requested that a surgical procedure be carried out in an effort to deal with these fibroids.  The procedure of abdominal myomectomy was discussed with the patient including the risks associated with this of hemorrhage, infection, and possible hysterectomy.  With this information, informed consent was obtained, and the patient was taken to the operating room on Dec 02, 2011, for abdominal myomectomy.  The procedure was carried out with removal of 32 myomas, the largest one being approximately 10 cm in size.  The total weight was 1200 g.  During the surgical procedure, there was significant blood loss and the patient did receive 4 units of packed cells and fresh frozen plasma.  She was followed in the ICU for the initial 48 hours after surgery.  She remained stable.  Her postoperative problems included postoperative anemia with a hemoglobin dropping to 8.1. Because of the large size of the tumors and the risk of possible further bleeding, it  was elected to transfuse the patient once again with 2 units of packed cells bringing her hemoglobin postoperatively up to 10.1.  Hemoglobin remained stable at this point.  By the fourth postoperative day, the patient's ileus had resolved.  She was ambulating, tolerating p.o. medications, and had resumption of her bowel function.  The patient was discharged home.  She was instructed to do no heavy lifting, to place nothing in vagina, to call if there are any problems such as fever, pain, or heavy bleeding.  She was asked to return to the office on Friday, December 09, 2011, to have her staples removed.  She went home in satisfactory condition.  Medications for home included Tylox 1 every 3 hours as needed for pain.  She was instructed to resume her prior medications.  The final pathology report on the myomectomy specimen revealed the presence of 32 uterine fibroids with a weight of 1161 g, fibroids measured from 0.9 cm-10 cm.     Miguel Aschoff, M.D.     AR/MEDQ  D:  12/12/2011  T:  12/12/2011  Job:  [id number]

## 2011-12-20 ENCOUNTER — Encounter (HOSPITAL_COMMUNITY): Payer: Self-pay

## 2011-12-20 ENCOUNTER — Inpatient Hospital Stay (HOSPITAL_COMMUNITY)
Admission: AD | Admit: 2011-12-20 | Discharge: 2011-12-20 | Disposition: A | Discharge status: Home / Self Care | Payer: PRIVATE HEALTH INSURANCE | Source: Ambulatory Visit | Attending: Obstetrics & Gynecology | Admitting: Obstetrics & Gynecology

## 2011-12-20 DIAGNOSIS — Z90710 Acquired absence of both cervix and uterus: Secondary | ICD-10-CM | POA: Insufficient documentation

## 2011-12-20 DIAGNOSIS — N898 Other specified noninflammatory disorders of vagina: Secondary | ICD-10-CM | POA: Insufficient documentation

## 2011-12-20 LAB — CBC
HCT: 35.5 % — ABNORMAL LOW (ref 36.0–46.0)
Hemoglobin: 12.4 g/dL (ref 12.0–15.0)
MCV: 89.2 fL (ref 78.0–100.0)
RDW: 13.8 % (ref 11.5–15.5)
WBC: 8 10*3/uL (ref 4.0–10.5)

## 2011-12-20 MED ORDER — LACTATED RINGERS IV SOLN
Freq: Once | INTRAVENOUS | Status: AC
  Administered 2011-12-20: 17:00:00 via INTRAVENOUS

## 2011-12-20 MED ORDER — CARBOPROST TROMETHAMINE 250 MCG/ML IM SOLN
250.0000 ug | Freq: Once | INTRAMUSCULAR | Status: AC
  Administered 2011-12-20: 250 ug via INTRAMUSCULAR
  Filled 2011-12-20: qty 1

## 2011-12-20 MED ORDER — ONDANSETRON 8 MG PO TBDP: 8.0000 mg | ORAL_TABLET | Freq: Once | ORAL | Status: DC

## 2011-12-20 MED ORDER — FENTANYL CITRATE 0.05 MG/ML IJ SOLN
50.0000 ug | Freq: Once | INTRAMUSCULAR | Status: AC
  Administered 2011-12-20: 50 ug via INTRAVENOUS
  Filled 2011-12-20: qty 2

## 2011-12-20 MED ORDER — ONDANSETRON 8 MG/NS 50 ML IVPB
8.0000 mg | Freq: Once | INTRAVENOUS | Status: AC
  Administered 2011-12-20: 8 mg via INTRAVENOUS
  Filled 2011-12-20: qty 8

## 2011-12-20 MED ORDER — TRANEXAMIC ACID 650 MG PO TABS
1300.0000 mg | ORAL_TABLET | Freq: Once | ORAL | Status: AC
  Administered 2011-12-20: 1300 mg via ORAL
  Filled 2011-12-20: qty 2

## 2011-12-20 NOTE — MAU Provider Note (Signed)
26 year old black female who underwent an abdominal myomectomy approximately two weeks ago. She presented to triage today reporting that she was having heavy vaginal bleeding and filling a pap every 10 minutes. She originally had 24 to 26 week fibroids and had 32 fibroids removed at the time of surgery weighing about 1200 grams. She did receive 6 units of blood when she was admitted for the myomectomy.  In triage she was noted to have a 12 week size uterus with a healing abdominal scar. On pelvic exam she was noted to have moderate vaginal bleeding through her cervix. The uterus was non tender.  CBC is pending.  Impression: Menorrhagia s/p abdominal myomectomy.  Plan: I feel that this can be controlled with medication. Will check CBC to see if blood will be necessary.  Hemabate 250 micrograms Lysteda 650 mgs  Two three times per day  Reassess after CBC and four hours of observation

## 2011-12-20 NOTE — Discharge Instructions (Signed)
Pick up prescriptions that Dr. Hessie Diener sent over to your pharmacy. You may take tylenol or aleve over the counter as directed.

## 2011-12-20 NOTE — Progress Notes (Signed)
Dr Duane Lope called back and he was notified of patient recent vital signs, he desires to be discharge and pain medicine suggestions. Order received to discharge patient home, give zofran 8mg  odt, advice patient to take tylenol or aleve otc as directed.

## 2011-12-20 NOTE — Progress Notes (Signed)
Dr Duane Lope notified of patient c/o abdominal cramping and chest pain and her feeling nauseated. Order received to start 1 liter lr, 1mg  stadol iv and zofran 8mg  iv.

## 2011-12-20 NOTE — MAU Note (Signed)
Patient is brought in straight from the lobby. She c/o vaginal bleeding and abdominal cramping. She denies any dizziness.

## 2012-01-08 ENCOUNTER — Encounter (HOSPITAL_COMMUNITY): Payer: Self-pay

## 2012-01-08 ENCOUNTER — Emergency Department (HOSPITAL_COMMUNITY)
Admission: EM | Admit: 2012-01-08 | Discharge: 2012-01-08 | Disposition: A | Discharge status: Home / Self Care | Payer: PRIVATE HEALTH INSURANCE | Attending: Emergency Medicine | Admitting: Emergency Medicine

## 2012-01-08 DIAGNOSIS — B86 Scabies: Secondary | ICD-10-CM | POA: Insufficient documentation

## 2012-01-08 MED ORDER — PERMETHRIN 5 % EX CREA: TOPICAL_CREAM | CUTANEOUS | Status: AC

## 2012-01-08 NOTE — ED Notes (Signed)
Pt states rash on arms for several days states no relief with benadryl denies difficulty swallowing, no apparent resp distress states sore throat

## 2012-01-08 NOTE — ED Provider Notes (Signed)
History     CSN: [id number]  Arrival date & time 01/08/12  1312   First MD Initiated Contact with Patient 01/08/12 1327      Chief Complaint  Patient presents with  . Rash    (Consider location/radiation/quality/duration/timing/severity/associated sxs/prior treatment) Patient is a 26 y.o. female presenting with rash. The history is provided by the patient.  Rash  This is a new problem. Episode onset: one week ago. The problem has not changed since onset.The problem is associated with nothing. There has been no fever. The rash is present on the right foot, left foot, abdomen, left wrist, right wrist, right fingers, left fingers, left arm and right arm. The patient is experiencing no pain. Associated symptoms include itching. Pertinent negatives include no blisters, no pain and no weeping. She has tried antihistamines for the symptoms. The treatment provided no relief.    Past Medical History  Diagnosis Date  . Allergy     seasonal allergies  . Pelvic cyst     Seen by gynecologist Dr. Tenny Craw. Scheduled for ex lap on 12/02/11  . Pneumonia   . Depression     Past Surgical History  Procedure Date  . No past surgeries   . Myomectomy 12/02/2011    Procedure: MYOMECTOMY;  Surgeon: Miguel Aschoff, MD;  Location: [facility];  Service: Gynecology;  Laterality: N/A;    No family history on file.  History  Substance Use Topics  . Smoking status: Never Smoker   . Smokeless tobacco: Not on file  . Alcohol Use: Yes     socially    OB History    Grav Para Term Preterm Abortions TAB SAB Ect Mult Living                  Review of Systems  Constitutional: Negative for fever and chills.  HENT: Negative for trouble swallowing.   Respiratory: Negative for shortness of breath and wheezing.   Gastrointestinal: Negative for nausea and vomiting.  Musculoskeletal: Negative for joint swelling.  Skin: Positive for itching and rash.    Allergies  Dilaudid and Penicillins  Home Medications    Current Outpatient Rx  Name Route Sig Dispense Refill  . DIPHENHYDRAMINE HCL 25 MG PO CAPS Oral Take 25 mg by mouth every 6 (six) hours as needed. Allergy/itching    . FLUOXETINE HCL 20 MG PO TABS Oral Take 1 tablet (20 mg total) by mouth daily. 30 tablet 3  . HYDROCODONE-ACETAMINOPHEN 5-500 MG PO TABS Oral Take 1 tablet by mouth 2 (two) times daily as needed. For pain      BP 124/77  Pulse 94  Temp 98.1 F (36.7 C) (Oral)  Resp 18  SpO2 100%  LMP 12/22/2011  Physical Exam  Nursing note and vitals reviewed. Constitutional: She appears well-developed and well-nourished. No distress.  HENT:  Head: Normocephalic and atraumatic.  Mouth/Throat: Oropharynx is clear and moist.  Neck: Normal range of motion. Neck supple.  Cardiovascular: Normal rate, regular rhythm and normal heart sounds.   Pulmonary/Chest: Effort normal and breath sounds normal. No respiratory distress.  Musculoskeletal: Normal range of motion.  Neurological: She is alert.  Skin: Skin is warm and dry. No petechiae and no purpura noted. She is not diaphoretic.       Erythematous papular rash located on the volar aspect of the wrist, the waist, web spaces of the fingers, and the extensor surface of the elbows, and both feet.    Psychiatric: She has a normal mood and affect.  ED Course  Procedures (including critical care time)  Labs Reviewed - No data to display No results found.   1. Scabies       MDM  Appearance of rash and distribution consistent with Scabies.  Patient given prescription with Permethrin  Cream.          Pascal Lux [place], PA-C 01/08/12 2156

## 2012-01-09 NOTE — ED Provider Notes (Signed)
Medical screening examination/treatment/procedure(s) were performed by non-physician practitioner and as supervising physician I was immediately available for consultation/collaboration.  Ethelda Chick, MD 01/09/12 [phone]

## 2012-02-14 ENCOUNTER — Encounter (HOSPITAL_COMMUNITY): Payer: Self-pay | Admitting: *Deleted

## 2012-02-14 ENCOUNTER — Emergency Department (HOSPITAL_COMMUNITY)
Admission: EM | Admit: 2012-02-14 | Discharge: 2012-02-15 | Disposition: A | Discharge status: Home / Self Care | Payer: PRIVATE HEALTH INSURANCE | Attending: Emergency Medicine | Admitting: Emergency Medicine

## 2012-02-14 DIAGNOSIS — Z880 Allergy status to penicillin: Secondary | ICD-10-CM | POA: Insufficient documentation

## 2012-02-14 DIAGNOSIS — Z3202 Encounter for pregnancy test, result negative: Secondary | ICD-10-CM | POA: Insufficient documentation

## 2012-02-14 DIAGNOSIS — R109 Unspecified abdominal pain: Secondary | ICD-10-CM | POA: Insufficient documentation

## 2012-02-14 DIAGNOSIS — R110 Nausea: Secondary | ICD-10-CM | POA: Insufficient documentation

## 2012-02-14 HISTORY — DX: Benign neoplasm of connective and other soft tissue, unspecified: D21.9

## 2012-02-14 LAB — POCT I-STAT, CHEM 8
BUN: 7 mg/dL (ref 6–23)
Creatinine, Ser: 0.8 mg/dL (ref 0.50–1.10)
Glucose, Bld: 74 mg/dL (ref 70–99)
Hemoglobin: 12.9 g/dL (ref 12.0–15.0)
Potassium: 4.2 mEq/L (ref 3.5–5.1)

## 2012-02-14 LAB — CBC WITH DIFFERENTIAL/PLATELET
Basophils Relative: 0 % (ref 0–1)
HCT: 37.1 % (ref 36.0–46.0)
Hemoglobin: 12.8 g/dL (ref 12.0–15.0)
MCH: 31.4 pg (ref 26.0–34.0)
MCHC: 34.5 g/dL (ref 30.0–36.0)
Monocytes Absolute: 0.4 10*3/uL (ref 0.1–1.0)
Monocytes Relative: 5 % (ref 3–12)
Neutro Abs: 5.5 10*3/uL (ref 1.7–7.7)

## 2012-02-14 LAB — URINALYSIS, ROUTINE W REFLEX MICROSCOPIC
Bilirubin Urine: NEGATIVE
Glucose, UA: NEGATIVE mg/dL
Ketones, ur: NEGATIVE mg/dL
Leukocytes, UA: NEGATIVE
pH: 7 (ref 5.0–8.0)

## 2012-02-14 NOTE — ED Notes (Signed)
Pt is here with abdominal cramping and pain to lower abdomen and lower back pain.  No vaginal discharge or bleeding.  Pt reports weakness and increased thirst.  LMP--July 19

## 2012-02-15 MED ORDER — ONDANSETRON 4 MG PO TBDP
8.0000 mg | ORAL_TABLET | Freq: Once | ORAL | Status: AC
  Administered 2012-02-15: 8 mg via ORAL
  Filled 2012-02-15: qty 2

## 2012-02-15 MED ORDER — IBUPROFEN 200 MG PO TABS
600.0000 mg | ORAL_TABLET | Freq: Once | ORAL | Status: AC
  Administered 2012-02-15: 600 mg via ORAL
  Filled 2012-02-15: qty 3

## 2012-02-15 MED ORDER — DICYCLOMINE HCL 10 MG PO CAPS
10.0000 mg | ORAL_CAPSULE | Freq: Once | ORAL | Status: AC
  Administered 2012-02-15: 10 mg via ORAL
  Filled 2012-02-15: qty 1

## 2012-02-15 MED ORDER — ONDANSETRON HCL 4 MG PO TABS: 4.0000 mg | ORAL_TABLET | Freq: Four times a day (QID) | ORAL | Status: AC

## 2012-02-15 NOTE — ED Provider Notes (Signed)
History     CSN: [id number]  Arrival date & time 02/14/12  1735   First MD Initiated Contact with Patient 02/14/12 2353      Chief Complaint  Patient presents with  . Weakness  . Abdominal Pain    (Consider location/radiation/quality/duration/timing/severity/associated sxs/prior treatment) Patient is a 26 y.o. female presenting with cramps. The history is provided by the patient and a friend. No language interpreter was used.  Abdominal Cramping The primary symptoms of the illness include fatigue, nausea and vomiting. The primary symptoms of the illness do not include fever, shortness of breath, diarrhea, dysuria, vaginal discharge or vaginal bleeding. The current episode started 6 to 12 hours ago. The onset of the illness was gradual. The problem has been gradually worsening.  The patient states that she believes she is currently not pregnant. The patient has not had a change in bowel habit. Symptoms associated with the illness do not include chills, diaphoresis, constipation, urgency, hematuria, frequency or back pain.   26 year old female coming in with lower abdominal cramping and nausea that started today. States that her last menstrual period was July 19. Denies vaginal bleeding or discharge. States that she did vomit 2 days ago. GYN is Dr. Tenny Craw. She has a past medical history of pelvic cysts with recent u/s,  pneumonia depression and fibroids. States that she does not usually get abdominal cramping before her period which should be starting next week. Last bowel movement was this morning and it was normal.   Past Medical History  Diagnosis Date  . Allergy     seasonal allergies  . Pelvic cyst     Seen by gynecologist Dr. Tenny Craw. Scheduled for ex lap on 12/02/11  . Pneumonia   . Depression   . Fibroids     Past Surgical History  Procedure Date  . No past surgeries   . Myomectomy 12/02/2011    Procedure: MYOMECTOMY;  Surgeon: Miguel Aschoff, MD;  Location: [facility];  Service:  Gynecology;  Laterality: N/A;    No family history on file.  History  Substance Use Topics  . Smoking status: Never Smoker   . Smokeless tobacco: Not on file  . Alcohol Use: Yes     socially    OB History    Grav Para Term Preterm Abortions TAB SAB Ect Mult Living                  Review of Systems  Constitutional: Positive for fatigue. Negative for fever, chills and diaphoresis.  HENT: Negative.   Eyes: Negative.   Respiratory: Negative.  Negative for shortness of breath.   Cardiovascular: Negative.   Gastrointestinal: Positive for nausea and vomiting. Negative for diarrhea and constipation.  Genitourinary: Negative for dysuria, urgency, frequency, hematuria, flank pain, vaginal bleeding, vaginal discharge and enuresis.  Musculoskeletal: Negative for back pain.  Neurological: Positive for light-headedness. Negative for seizures, syncope, facial asymmetry, speech difficulty, numbness and headaches.  Psychiatric/Behavioral: Negative.   All other systems reviewed and are negative.    Allergies  Dilaudid and Penicillins  Home Medications   Current Outpatient Rx  Name Route Sig Dispense Refill  . DIPHENHYDRAMINE HCL 25 MG PO CAPS Oral Take 25 mg by mouth every 6 (six) hours as needed. Allergy/itching    . FLUOXETINE HCL 20 MG PO CAPS Oral Take 20 mg by mouth daily.    Marland Kitchen HYDROCODONE-ACETAMINOPHEN 5-500 MG PO TABS Oral Take 1 tablet by mouth 2 (two) times daily as needed. For pain    .  FLUOXETINE HCL 20 MG PO TABS Oral Take 1 tablet (20 mg total) by mouth daily. 30 tablet 3    BP 121/63  Pulse 79  Temp 98.5 F (36.9 C) (Oral)  Resp 18  SpO2 100%  LMP 01/18/2012  Physical Exam  Nursing note and vitals reviewed. Constitutional: She is oriented to person, place, and time. She appears well-developed and well-nourished.  HENT:  Head: Normocephalic and atraumatic.  Eyes: Conjunctivae and EOM are normal. Pupils are equal, round, and reactive to light.  Neck: Normal  range of motion. Neck supple.  Cardiovascular: Normal rate.   Pulmonary/Chest: Effort normal.  Abdominal: Soft. Bowel sounds are normal. She exhibits no distension and no mass. There is no hepatosplenomegaly, splenomegaly or hepatomegaly. There is tenderness in the suprapubic area. There is no rebound, no guarding and no CVA tenderness. No hernia.  Musculoskeletal: Normal range of motion. She exhibits no edema and no tenderness.  Neurological: She is alert and oriented to person, place, and time. She has normal reflexes.  Skin: Skin is warm and dry.  Psychiatric: She has a normal mood and affect.    ED Course  Procedures (including critical care time)  Labs Reviewed  POCT I-STAT, CHEM 8 - Abnormal; Notable for the following:    Calcium, Ion 1.32 (*)     All other components within normal limits  URINALYSIS, ROUTINE W REFLEX MICROSCOPIC  CBC WITH DIFFERENTIAL  POCT PREGNANCY, URINE   No results found.   No diagnosis found.    MDM  26yo with abdominal cramping and nausea. Premenstral.  Better after bentyl, iburprofen and zofran.  rx for zofran.  Labs unremarkable.  Will follow up with Dr. Tenny Craw if not better this week.  Suspect pre-menstal symptoms.  preg test -   Labs Reviewed  POCT I-STAT, CHEM 8 - Abnormal; Notable for the following:    Calcium, Ion 1.32 (*)     All other components within normal limits  URINALYSIS, ROUTINE W REFLEX MICROSCOPIC  CBC WITH DIFFERENTIAL  POCT PREGNANCY, URINE  LAB REPORT - SCANNED          Remi Haggard, NP 02/15/12 1549

## 2012-02-15 NOTE — ED Provider Notes (Signed)
Medical screening examination/treatment/procedure(s) were performed by non-physician practitioner and as supervising physician I was immediately available for consultation/collaboration.  Rosanne Ashing, MD 02/15/12 [phone]

## 2012-02-15 NOTE — ED Notes (Signed)
The patient is AOx4 and comfortable with the discharge instructions. 

## 2012-04-11 ENCOUNTER — Emergency Department (HOSPITAL_COMMUNITY): Payer: PRIVATE HEALTH INSURANCE

## 2012-04-11 ENCOUNTER — Emergency Department (HOSPITAL_COMMUNITY)
Admission: EM | Admit: 2012-04-11 | Discharge: 2012-04-11 | Disposition: A | Discharge status: Home / Self Care | Payer: PRIVATE HEALTH INSURANCE | Attending: Emergency Medicine | Admitting: Emergency Medicine

## 2012-04-11 ENCOUNTER — Encounter (HOSPITAL_COMMUNITY): Payer: Self-pay

## 2012-04-11 DIAGNOSIS — J069 Acute upper respiratory infection, unspecified: Secondary | ICD-10-CM | POA: Insufficient documentation

## 2012-04-11 LAB — URINALYSIS, ROUTINE W REFLEX MICROSCOPIC
Bilirubin Urine: NEGATIVE
Glucose, UA: NEGATIVE mg/dL
Hgb urine dipstick: NEGATIVE
Specific Gravity, Urine: 1.023 (ref 1.005–1.030)
pH: 7.5 (ref 5.0–8.0)

## 2012-04-11 MED ORDER — HYDROCODONE-HOMATROPINE 5-1.5 MG/5ML PO SYRP: 5.0000 mL | ORAL_SOLUTION | Freq: Four times a day (QID) | ORAL | Status: DC | PRN

## 2012-04-11 MED ORDER — ALBUTEROL SULFATE (5 MG/ML) 0.5% IN NEBU
2.5000 mg | INHALATION_SOLUTION | Freq: Once | RESPIRATORY_TRACT | Status: AC
  Administered 2012-04-11: 2.5 mg via RESPIRATORY_TRACT
  Filled 2012-04-11: qty 0.5

## 2012-04-11 MED ORDER — FLUTICASONE PROPIONATE 50 MCG/ACT NA SUSP: 2.0000 | Freq: Every day | NASAL | Status: DC

## 2012-04-11 MED ORDER — IPRATROPIUM BROMIDE 0.02 % IN SOLN
0.5000 mg | Freq: Once | RESPIRATORY_TRACT | Status: AC
  Administered 2012-04-11: 0.5 mg via RESPIRATORY_TRACT
  Filled 2012-04-11: qty 2.5

## 2012-04-11 NOTE — ED Notes (Signed)
Pt presents with NAD- Pt c/o of allergies that started this AM- only vomited x 1 with "coughing spell"

## 2012-04-11 NOTE — ED Provider Notes (Signed)
Medical screening examination/treatment/procedure(s) were performed by non-physician practitioner and as supervising physician I was immediately available for consultation/collaboration.  Shelda Jakes, MD 04/11/12 2046

## 2012-04-11 NOTE — ED Provider Notes (Signed)
History     CSN: [id number]  Arrival date & time 04/11/12  1651   First MD Initiated Contact with Patient 04/11/12 2033      Chief Complaint  Patient presents with  . Nasal Congestion  . Cough  . Back Pain    (Consider location/radiation/quality/duration/timing/severity/associated sxs/prior treatment) Patient is a 26 y.o. female presenting with URI. The history is provided by the patient.  URI The primary symptoms include fatigue, headaches, cough and myalgias. Primary symptoms do not include fever, ear pain, sore throat, swollen glands, wheezing, abdominal pain, nausea, arthralgias or rash. Primary symptoms comment: nasal congestion The current episode started today. This is a new problem. The problem has been gradually worsening.  The onset of the illness is associated with exposure to sick contacts. Symptoms associated with the illness include congestion and rhinorrhea. The illness is not associated with chills, plugged ear sensation, facial pain or sinus pressure. The following treatments were addressed: Acetaminophen was ineffective. A decongestant was ineffective.    Past Medical History  Diagnosis Date  . Allergy     seasonal allergies  . Pelvic cyst     Seen by gynecologist Dr. Tenny Craw. Scheduled for ex lap on 12/02/11  . Pneumonia   . Depression   . Fibroids     Past Surgical History  Procedure Date  . No past surgeries   . Myomectomy 12/02/2011    Procedure: MYOMECTOMY;  Surgeon: Miguel Aschoff, MD;  Location: [facility];  Service: Gynecology;  Laterality: N/A;    No family history on file.  History  Substance Use Topics  . Smoking status: Never Smoker   . Smokeless tobacco: Not on file  . Alcohol Use: Yes     socially    OB History    Grav Para Term Preterm Abortions TAB SAB Ect Mult Living                  Review of Systems  Constitutional: Positive for fatigue. Negative for fever, chills, diaphoresis and activity change.  HENT: Positive for congestion and  rhinorrhea. Negative for ear pain, sore throat, neck pain and sinus pressure.   Respiratory: Positive for cough. Negative for wheezing.   Gastrointestinal: Negative for nausea and abdominal pain.  Genitourinary: Negative for dysuria.  Musculoskeletal: Positive for myalgias. Negative for arthralgias.  Skin: Negative for color change, rash and wound.  Neurological: Positive for headaches.  All other systems reviewed and are negative.    Allergies  Dilaudid and Penicillins  Home Medications   Current Outpatient Rx  Name Route Sig Dispense Refill  . ALBUTEROL SULFATE HFA 108 (90 BASE) MCG/ACT IN AERS Inhalation Inhale 2 puffs into the lungs every 4 (four) hours as needed. Wheezing or shortness of breath.    . FLUOXETINE HCL 20 MG PO CAPS Oral Take 20 mg by mouth daily.    Marland Kitchen FLUOXETINE HCL 20 MG PO TABS Oral Take 1 tablet (20 mg total) by mouth daily. 30 tablet 3    BP 129/73  Pulse 79  Temp 98.6 F (37 C) (Oral)  Resp 23  SpO2 100%  LMP 03/22/2012  Physical Exam  Constitutional: She is oriented to person, place, and time. She appears well-developed and well-nourished. No distress.  HENT:  Head: Normocephalic and atraumatic. No trismus in the jaw.  Right Ear: Tympanic membrane and external ear normal. No drainage or tenderness. No mastoid tenderness.  Left Ear: Tympanic membrane and external ear normal. No drainage or tenderness. No mastoid tenderness.  Nose:  Rhinorrhea present.  Mouth/Throat: Uvula is midline, oropharynx is clear and moist and mucous membranes are normal. No uvula swelling. No oropharyngeal exudate.  Eyes: Conjunctivae normal and EOM are normal. Right eye exhibits no discharge. Left eye exhibits no discharge. No scleral icterus.  Neck: Normal range of motion. Neck supple. No rigidity.  Cardiovascular: Normal rate, regular rhythm and normal heart sounds.   Pulmonary/Chest: Effort normal and breath sounds normal. No stridor. No respiratory distress. She has no  wheezes. She exhibits no tenderness.  Abdominal: Soft. There is no tenderness.  Musculoskeletal: Normal range of motion.  Neurological: She is alert and oriented to person, place, and time.  Skin: Skin is warm and dry. No rash noted. She is not diaphoretic.  Psychiatric: She has a normal mood and affect. Her behavior is normal.    ED Course  Procedures (including critical care time)   Labs Reviewed  URINALYSIS, ROUTINE W REFLEX MICROSCOPIC  POCT PREGNANCY, URINE   Dg Chest 2 View  04/11/2012  *RADIOLOGY REPORT*  Clinical Data: Short of breath.  Nasal congestion.  Back pain.  CHEST - 2 VIEW  Comparison: 09/14/2011  Findings: Heart size is normal.  Mediastinal shadows are normal. There is central bronchial thickening but no infiltrate, collapse or effusion.  No significant bony finding.  IMPRESSION: Possible bronchitis.  No consolidation or collapse.   Original Report Authenticated By: Thomasenia Sales, M.D.      No diagnosis found.    MDM  URI  Pt CXR negative for acute infiltrate. Patients symptoms are consistent with URI, likely viral etiology. Discussed that antibiotics are not indicated for viral infections. Pt will be discharged with symptomatic treatment.  Verbalizes understanding and is agreeable with plan. Pt is hemodynamically stable & in NAD prior to dc.         Jaci Carrel, [place] 04/11/12 2044

## 2012-04-17 ENCOUNTER — Telehealth: Payer: Self-pay | Admitting: Family Medicine

## 2012-04-17 NOTE — Telephone Encounter (Signed)
Called pt and left message 'copy is ready for pick up'. Lorenda Hatchet, Renato Battles

## 2012-04-17 NOTE — Telephone Encounter (Signed)
Patient is calling for a copy of her shot record.  Please call her when they are ready to be picked up.

## 2012-04-18 NOTE — Telephone Encounter (Signed)
Patient is asking for her shot record to [employer] [phone].

## 2012-04-19 NOTE — Telephone Encounter (Signed)
Faxed. .Dayja Loveridge  

## 2012-04-25 ENCOUNTER — Ambulatory Visit (INDEPENDENT_AMBULATORY_CARE_PROVIDER_SITE_OTHER): Payer: PRIVATE HEALTH INSURANCE | Admitting: Family Medicine

## 2012-04-25 ENCOUNTER — Encounter: Payer: Self-pay | Admitting: Family Medicine

## 2012-04-25 VITALS — BP 114/80 | HR 76 | Ht 67.0 in | Wt 185.0 lb

## 2012-04-25 DIAGNOSIS — F3289 Other specified depressive episodes: Secondary | ICD-10-CM

## 2012-04-25 DIAGNOSIS — F329 Major depressive disorder, single episode, unspecified: Secondary | ICD-10-CM

## 2012-04-25 DIAGNOSIS — M549 Dorsalgia, unspecified: Secondary | ICD-10-CM

## 2012-04-25 MED ORDER — TRAMADOL HCL 50 MG PO TABS: 50.0000 mg | ORAL_TABLET | Freq: Three times a day (TID) | ORAL | Status: DC | PRN

## 2012-04-25 MED ORDER — FLUOXETINE HCL 40 MG PO CAPS: 40.0000 mg | ORAL_CAPSULE | Freq: Every day | ORAL | Status: DC

## 2012-04-25 NOTE — Assessment & Plan Note (Addendum)
phq9 improved from 23 to 18. Currently on prozac 20mg  daily which patient has been tolerating. Increase to 40mg  daily as patient does still remain symptomatic. Having some difficulty sleeping at night. Went over sleep hygiene as well as possibility of melatonin. Increase in prozac may also help. Reassess in 3-4 weeks.

## 2012-04-25 NOTE — Progress Notes (Signed)
Patient ID: FRANCINA BETHEA    DOB: [birth year], 26 y.o.   MRN: [id number] --- Subjective:  Lodie is a 26 y.o.female who presents for follow up on back pain and depression. - back pain: h/o scoliosis as a child. In the last 6 months, has been having chronic lower back pain. Better with slouching over. Worst after a day of being on her feet. Throbbing in nature. 8/10. No lower extremity weakness, no loss of sensation, no urine or bowel incontinence. No injury prior to this back pain. Has been taking 1000mg  daily without much relief.  - depression: taking prozac 20mg  daily and tolerating it well. Feels like it is helping some, but she reports still having some periods of feeling "stuck" in her sadness. She denies any SI/HI. Phq9: score: 18, somewhat difficult.  Has some difficulty sleeping and falling asleep. Feels like she is preoccupied by things which keep her from sleeping. She does feel low in energy and feels like she would benefit from sleep.   ROS: see HPI Past Medical History: reviewed and updated medications and allergies. Social History: Tobacco: denies   Objective: Filed Vitals:   04/25/12 1357  BP: 114/80  Pulse: 76    Physical Examination:   General appearance - alert, well appearing, and in no distress Chest - clear to auscultation, no wheezes, rales or rhonchi, symmetric air entry Heart - normal rate, regular rhythm, normal S1, S2, no murmurs, rubs, clicks or gallops Abdomen - soft, nontender, nondistended, no masses or organomegaly Extremities - peripheral pulses normal, no pedal edema Back - tenderness to palpation along thoracic and lumbar spine, no paraspinal tenderness, mild elevation of right side compared to left when bending forward.

## 2012-04-25 NOTE — Assessment & Plan Note (Signed)
Chronic back pain x6 months, not relieved with ibuprofen. Given history of scoliosis, will obtain xray lumbar and thoracic. Will also prescribe tramadol for temporary pain control. aspercreme or capsaisin for local effect. Most importantly, sent referral to PT. Patient had had appointment with them prior to her fibroid surgery and had not been able to make her appointment because of the surgery. Pain seems to be linked to position and posture and patient would very likely benefit from physical therapy. Follow up in 1 month.

## 2012-04-25 NOTE — Patient Instructions (Signed)
For the back pain, we are getting an xray. Take the tramadol. You can also try over the counter aspercreme or capsaisan gel to see if this helps.  Going to physical therapy will also be very important.  For the prozac, let's increase the dose to 40mg  daily. I'd like to see you back in 1 month to see how it's working for you.

## 2012-05-02 ENCOUNTER — Ambulatory Visit: Payer: PRIVATE HEALTH INSURANCE | Attending: Family Medicine

## 2012-05-02 DIAGNOSIS — R293 Abnormal posture: Secondary | ICD-10-CM | POA: Insufficient documentation

## 2012-05-02 DIAGNOSIS — M6281 Muscle weakness (generalized): Secondary | ICD-10-CM | POA: Insufficient documentation

## 2012-05-02 DIAGNOSIS — IMO0001 Reserved for inherently not codable concepts without codable children: Secondary | ICD-10-CM | POA: Insufficient documentation

## 2012-05-02 DIAGNOSIS — M545 Low back pain, unspecified: Secondary | ICD-10-CM | POA: Insufficient documentation

## 2012-05-02 DIAGNOSIS — M546 Pain in thoracic spine: Secondary | ICD-10-CM | POA: Insufficient documentation

## 2012-05-16 ENCOUNTER — Encounter: Payer: PRIVATE HEALTH INSURANCE | Admitting: Rehabilitation

## 2012-05-23 ENCOUNTER — Ambulatory Visit: Payer: PRIVATE HEALTH INSURANCE | Admitting: Family Medicine

## 2012-05-23 ENCOUNTER — Encounter: Payer: PRIVATE HEALTH INSURANCE | Admitting: Rehabilitation

## 2012-05-24 ENCOUNTER — Telehealth: Payer: Self-pay | Admitting: Family Medicine

## 2012-05-24 NOTE — Telephone Encounter (Signed)
Received fax from Physical therapy stating that patient missed 2 appointments for PT.

## 2012-05-30 ENCOUNTER — Encounter: Payer: PRIVATE HEALTH INSURANCE | Admitting: Rehabilitation

## 2012-07-06 ENCOUNTER — Encounter (HOSPITAL_COMMUNITY): Payer: Self-pay | Admitting: Emergency Medicine

## 2012-07-06 ENCOUNTER — Emergency Department (HOSPITAL_COMMUNITY)
Admission: EM | Admit: 2012-07-06 | Discharge: 2012-07-06 | Disposition: A | Discharge status: Home / Self Care | Payer: 59 | Attending: Emergency Medicine | Admitting: Emergency Medicine

## 2012-07-06 DIAGNOSIS — Z8742 Personal history of other diseases of the female genital tract: Secondary | ICD-10-CM | POA: Insufficient documentation

## 2012-07-06 DIAGNOSIS — Z8701 Personal history of pneumonia (recurrent): Secondary | ICD-10-CM | POA: Insufficient documentation

## 2012-07-06 DIAGNOSIS — Z79899 Other long term (current) drug therapy: Secondary | ICD-10-CM | POA: Insufficient documentation

## 2012-07-06 DIAGNOSIS — H53149 Visual discomfort, unspecified: Secondary | ICD-10-CM | POA: Insufficient documentation

## 2012-07-06 DIAGNOSIS — R51 Headache: Secondary | ICD-10-CM | POA: Insufficient documentation

## 2012-07-06 DIAGNOSIS — F329 Major depressive disorder, single episode, unspecified: Secondary | ICD-10-CM | POA: Insufficient documentation

## 2012-07-06 DIAGNOSIS — Z3202 Encounter for pregnancy test, result negative: Secondary | ICD-10-CM | POA: Insufficient documentation

## 2012-07-06 DIAGNOSIS — F3289 Other specified depressive episodes: Secondary | ICD-10-CM | POA: Insufficient documentation

## 2012-07-06 LAB — URINALYSIS, ROUTINE W REFLEX MICROSCOPIC
Glucose, UA: NEGATIVE mg/dL
Leukocytes, UA: NEGATIVE
Nitrite: NEGATIVE
Specific Gravity, Urine: 1.025 (ref 1.005–1.030)
pH: 5.5 (ref 5.0–8.0)

## 2012-07-06 LAB — POCT PREGNANCY, URINE: Preg Test, Ur: NEGATIVE

## 2012-07-06 MED ORDER — KETOROLAC TROMETHAMINE 15 MG/ML IJ SOLN
15.0000 mg | Freq: Once | INTRAMUSCULAR | Status: AC
  Administered 2012-07-06: 15 mg via INTRAVENOUS
  Filled 2012-07-06: qty 1

## 2012-07-06 MED ORDER — SODIUM CHLORIDE 0.9 % IV BOLUS (SEPSIS): 1000.0000 mL | Freq: Once | INTRAVENOUS | Status: DC

## 2012-07-06 MED ORDER — DIPHENHYDRAMINE HCL 50 MG/ML IJ SOLN
25.0000 mg | Freq: Once | INTRAMUSCULAR | Status: AC
  Administered 2012-07-06: 25 mg via INTRAVENOUS
  Filled 2012-07-06: qty 1

## 2012-07-06 MED ORDER — METOCLOPRAMIDE HCL 5 MG/ML IJ SOLN
10.0000 mg | Freq: Once | INTRAMUSCULAR | Status: AC
  Administered 2012-07-06: 10 mg via INTRAVENOUS
  Filled 2012-07-06: qty 2

## 2012-07-06 NOTE — ED Provider Notes (Signed)
History    27 year old female with headache. Atraumatic. Gradual onset yesterday. Headache is diffuse and pounding. Post that when she is still. No appreciable exacerbating factors. Some photophobia no change in visual acuity. No numbness or tingling. No significant headache history denies neck or back pain. No neck stiffness. No fevers or chills. Has not tried taking anything her headache.  CSN: [id number]  Arrival date & time 07/06/12  4098   First MD Initiated Contact with Patient 07/06/12 [phone]      Chief Complaint  Patient presents with  . Headache    (Consider location/radiation/quality/duration/timing/severity/associated sxs/prior treatment) HPI  Past Medical History  Diagnosis Date  . Allergy     seasonal allergies  . Pelvic cyst     Seen by gynecologist Dr. Tenny Craw. Scheduled for ex lap on 12/02/11  . Pneumonia   . Depression   . Fibroids     Past Surgical History  Procedure Date  . No past surgeries   . Myomectomy 12/02/2011    Procedure: MYOMECTOMY;  Surgeon: Miguel Aschoff, MD;  Location: [facility];  Service: Gynecology;  Laterality: N/A;    No family history on file.  History  Substance Use Topics  . Smoking status: Never Smoker   . Smokeless tobacco: Not on file  . Alcohol Use: No     Comment: socially    OB History    Grav Para Term Preterm Abortions TAB SAB Ect Mult Living                  Review of Systems  All systems reviewed and negative, other than as noted in HPI.   Allergies  Dilaudid and Penicillins  Home Medications   Current Outpatient Rx  Name  Route  Sig  Dispense  Refill  . ALBUTEROL SULFATE HFA 108 (90 BASE) MCG/ACT IN AERS   Inhalation   Inhale 2 puffs into the lungs every 4 (four) hours as needed. Wheezing or shortness of breath.         . FLUOXETINE HCL 20 MG PO TABS   Oral   Take 1 tablet (20 mg total) by mouth daily.   30 tablet   3   . FLUOXETINE HCL 40 MG PO CAPS   Oral   Take 1 capsule (40 mg total) by mouth  daily.   90 capsule   3   . FLUTICASONE PROPIONATE 50 MCG/ACT NA SUSP   Nasal   Place 2 sprays into the nose daily.   16 g   2   . HYDROCODONE-HOMATROPINE 5-1.5 MG/5ML PO SYRP   Oral   Take 5 mLs by mouth every 6 (six) hours as needed for cough.   120 mL   0   . TRAMADOL HCL 50 MG PO TABS   Oral   Take 1 tablet (50 mg total) by mouth every 8 (eight) hours as needed for pain.   60 tablet   0     BP 145/94  Pulse 69  Temp 98.3 F (36.8 C) (Oral)  Resp 20  SpO2 100%  LMP 06/23/2012  Physical Exam  Nursing note and vitals reviewed. Constitutional: She is oriented to person, place, and time. She appears well-developed and well-nourished. No distress.  HENT:  Head: Normocephalic and atraumatic.  Right Ear: External ear normal.  Left Ear: External ear normal.  Mouth/Throat: Oropharynx is clear and moist.  Eyes: Conjunctivae normal are normal. Pupils are equal, round, and reactive to light. Right eye exhibits no discharge. Left eye exhibits  no discharge.       Optic discs are sharp  Neck: Normal range of motion. Neck supple.       No nuchal rigidity  Cardiovascular: Normal rate, regular rhythm and normal heart sounds.  Exam reveals no gallop and no friction rub.   No murmur heard. Pulmonary/Chest: Effort normal and breath sounds normal. No respiratory distress.  Abdominal: Soft. She exhibits no distension. There is no tenderness.  Musculoskeletal: She exhibits no edema and no tenderness.  Neurological: She is alert and oriented to person, place, and time. No cranial nerve deficit. She exhibits normal muscle tone. Coordination normal.       Speech is clear and content is appropriate. Good finger to nose testing bilaterally.  Skin: Skin is warm and dry. She is not diaphoretic.  Psychiatric: She has a normal mood and affect. Her behavior is normal. Thought content normal.    ED Course  Procedures (including critical care time)   Labs Reviewed  URINALYSIS, ROUTINE W  REFLEX MICROSCOPIC  POCT PREGNANCY, URINE   No results found.   1. Headache       MDM  26yF w/ HA. Suspect primary HA. Consider emergent secondary causes such as bleed, infectious or mass but doubt. There is no history of trauma. Pt has a nonfocal neurological exam. Afebrile and neck supple. No use of blood thinning medication. Consider ocular etiology such as acute angle closure glaucoma but doubt. Pt denies acute change in visual acuity and eye exam unremarkable. Doubt temporal arteritis given age.. Doubt CO poisoning. No contacts with similar symptoms. Doubt venous thrombosis. Doubt carotid or vertebral arteries dissection. Symptoms improved with meds. Feel that can be safely discharged, but strict return precautions discussed. Outpt fu.         Raeford Razor, MD 07/06/12 [phone]

## 2012-07-06 NOTE — ED Notes (Signed)
Pt presenting to ed with c/o headache pain with positive nausea no vomiting. Pt states positive blurred vision with dizziness. Pt states she has had numbness and tingling to her hands onset x 1 month. Pt is alert and oriented at this time. Pt states positive sensitivity to light and noise.

## 2012-07-06 NOTE — ED Notes (Signed)
Waiting for bolus to finish.

## 2012-07-11 ENCOUNTER — Ambulatory Visit (INDEPENDENT_AMBULATORY_CARE_PROVIDER_SITE_OTHER): Payer: 59 | Admitting: Family Medicine

## 2012-07-11 VITALS — BP 117/77 | HR 82 | Ht 67.0 in | Wt 200.0 lb

## 2012-07-11 DIAGNOSIS — R202 Paresthesia of skin: Secondary | ICD-10-CM

## 2012-07-11 DIAGNOSIS — R253 Fasciculation: Secondary | ICD-10-CM

## 2012-07-11 DIAGNOSIS — F329 Major depressive disorder, single episode, unspecified: Secondary | ICD-10-CM

## 2012-07-11 DIAGNOSIS — R209 Unspecified disturbances of skin sensation: Secondary | ICD-10-CM

## 2012-07-11 DIAGNOSIS — M549 Dorsalgia, unspecified: Secondary | ICD-10-CM

## 2012-07-11 DIAGNOSIS — N643 Galactorrhea not associated with childbirth: Secondary | ICD-10-CM

## 2012-07-11 DIAGNOSIS — G43909 Migraine, unspecified, not intractable, without status migrainosus: Secondary | ICD-10-CM

## 2012-07-11 DIAGNOSIS — R259 Unspecified abnormal involuntary movements: Secondary | ICD-10-CM

## 2012-07-11 LAB — BASIC METABOLIC PANEL
BUN: 13 mg/dL (ref 6–23)
Calcium: 9.8 mg/dL (ref 8.4–10.5)
Glucose, Bld: 81 mg/dL (ref 70–99)

## 2012-07-11 LAB — CBC WITH DIFFERENTIAL/PLATELET
Basophils Absolute: 0 10*3/uL (ref 0.0–0.1)
Eosinophils Relative: 1 % (ref 0–5)
Lymphocytes Relative: 27 % (ref 12–46)
Neutro Abs: 4.2 10*3/uL (ref 1.7–7.7)
Neutrophils Relative %: 65 % (ref 43–77)
Platelets: 369 10*3/uL (ref 150–400)
RDW: 13.6 % (ref 11.5–15.5)
WBC: 6.4 10*3/uL (ref 4.0–10.5)

## 2012-07-11 LAB — TSH: TSH: 0.68 u[IU]/mL (ref 0.350–4.500)

## 2012-07-11 LAB — POCT URINE PREGNANCY: Preg Test, Ur: NEGATIVE

## 2012-07-11 MED ORDER — TRAZODONE HCL 50 MG PO TABS
25.0000 mg | ORAL_TABLET | Freq: Every evening | ORAL | Status: DC | PRN
Start: 2012-07-11 — End: 2012-10-11

## 2012-07-11 MED ORDER — SUMATRIPTAN SUCCINATE 50 MG PO TABS: 50.0000 mg | ORAL_TABLET | ORAL | Status: DC | PRN

## 2012-07-11 MED ORDER — MELOXICAM 7.5 MG PO TABS: 7.5000 mg | ORAL_TABLET | Freq: Every day | ORAL | Status: DC

## 2012-07-11 MED ORDER — PROPRANOLOL HCL ER 80 MG PO CP24: 80.0000 mg | ORAL_CAPSULE | Freq: Every day | ORAL | Status: DC

## 2012-07-11 NOTE — Patient Instructions (Addendum)
I'll see you back in 1 month.   I'll call you with the results of the lab work.   I sent to the pharmacy: trazadone, mobic to take with food, propranolol and imitrex and tramadol.   You can get an xray at [facility].

## 2012-07-11 NOTE — Progress Notes (Signed)
Patient ID: [name]    DOB: 1986-03-17, 27 y.o.   MRN: [id number] --- Subjective:  [name] is a 27 y.o.female who presents for follow up with the following concerns: - episodes of dizziness with room spinning associated with numbness around mouth and metallic taste in mouth as well as tingling in hands. Episodes last 1 hour and have been occuring for 1 month. They occur 1-2 times weekly.  - Additionally, she reports twitching of her legs and arms which has been occuring every day for 2 weeks.  She has been having a frontal headache, associated with nausea and photophobia which occurs every day. Better with ibuprofen. She has a history of migraine headaches as a teenager for which she was on prophylaxis and imitrex for breakthrough. No sudden change in nature of headache. No change in vision. - nipple discharge: clear, non bloody, bilateral breasts, no breast mass or pain associated with it. It occurs if she puts pressure on breast and discharge is expressed that way. X 2 months.  - low back pain: had one session of PT and she did not continue with it because she thought she could do exercises at home. Tramadol not working. Pain is sharp in mid lower back. No lower extremity weakness or loss of sensation. No urine or bowel incontinence.  - trouble sleeping: persists. Tried melatonin which did not help. HAs been turning light and TV off when going to sleep. Does work night shift 3 days a week, which makes it a littke harder. Does drink some soda, no coffee, no tea.    ROS: see HPI Past Medical History: Medications: of note: prozac was started on 11/07/11 at 20mg  daily and increased to 40mg  daily on 10/23  Social History: Tobacco: none  Objective: Filed Vitals:   07/11/12 1358  BP: 117/77  Pulse: 82    Physical Examination:   General appearance - alert, well appearing, and in no distress Nose -mild erythema and congestion in nasal turbinates Mouth - mucous membranes moist, pharynx  normal without lesions Chest - clear to auscultation, no wheezes, rales or rhonchi, symmetric air entry Heart - normal rate, regular rhythm, normal S1, S2, no murmurs, rubs, clicks or gallops MSK - tenderness to palpation along mid lumbar spine.  5/5 strength with knee extension, flexion, hip flexion bilaterally Neuro - CN2-12 grossly intact, 5/5 grip strength bilaterally Breast - no appreciated mass or tenderness to palpation, expressed clear whitish liquid from left breast.

## 2012-07-12 DIAGNOSIS — G43909 Migraine, unspecified, not intractable, without status migrainosus: Secondary | ICD-10-CM | POA: Insufficient documentation

## 2012-07-12 DIAGNOSIS — R202 Paresthesia of skin: Secondary | ICD-10-CM | POA: Insufficient documentation

## 2012-07-12 DIAGNOSIS — N643 Galactorrhea not associated with childbirth: Secondary | ICD-10-CM | POA: Insufficient documentation

## 2012-07-12 LAB — PROLACTIN: Prolactin: 15.1 ng/mL

## 2012-07-12 NOTE — Assessment & Plan Note (Signed)
Reiterated importance of PT. Patient understood. Given point tenderness along lumbar spine, will check lumbar spine Xray.  mobic for pain control since tramadol did not appear to work.

## 2012-07-12 NOTE — Assessment & Plan Note (Addendum)
parasthesia associated with perioral numbness and light headedness could be from hyperventilation episodes, however will check BMP  for electrolyte abnormality. Also check CBC given lightheadedness.

## 2012-07-12 NOTE — Assessment & Plan Note (Signed)
On prozac 40mg  daily. Mood improved on this but may be experiencing some side effects from it, including dyskinesia which she may be experiencing. Sleep disturbance may also be from prozac. Follow up on lab tests for eval of tingling and dyskinesia, but if normal, will consider switching to citalopram.  Sleep disturbance: trial of trazadone.

## 2012-07-12 NOTE — Assessment & Plan Note (Addendum)
Appears more benign in nature given that this is bilateral, expressed, non bloody without any obvious mass on exam. Obtain TSH and prolactin. UPT negative. Likely physiological. Encouraged decrease in tactile stimulation and follow up about this in 3-4 months.

## 2012-07-12 NOTE — Assessment & Plan Note (Signed)
Likely migraine headache, given previous history of migraine, photophobia and nausea. No red flags suggesting intracranial process. Trial of propranolol 80 XL for prophylaxis. imitrex for breakthrough. Reviewed side effects of imitrex. Patient aware since had taken it before.

## 2012-07-16 ENCOUNTER — Telehealth: Payer: Self-pay | Admitting: Family Medicine

## 2012-07-16 NOTE — Telephone Encounter (Signed)
Pt is asking for her lab results from last week.

## 2012-07-16 NOTE — Telephone Encounter (Signed)
Called patient to let her know about her lab results. TSH, prolactin and BMP were all normal.  Nipple discharge is likely from physiological discharge since it is bilateral, non bloody and not associated with any mass. Recommended that patient reduce amount of stimulation of breast and monitor symptoms. If worsening or not resolving, consider [place].  Otherwise, patient continues to have tingling in arms and legs. Recommended that patient stop prozac to see if symptoms resolve. Patient agreed to this. Will see her back in 1 month and try another SSRI like citalopram.  Otherwise, episodes of tingling and perioral numbness could be from hyperventilation. Recommended that when this happens, for patient to slow her breathing down and see if this helps.  Patient agreed to plan.

## 2012-07-26 NOTE — Telephone Encounter (Signed)
Called pt. Asked pt to please see [place] to be checked. We need to examine her breast. Pt denies pain and fever. It was very hard to communicate due to static ? In line. Waiting for call back. Lorenda Hatchet, Renato Battles

## 2012-07-26 NOTE — Telephone Encounter (Signed)
Stopped prozac and still twitching and now breast leaking & itching & tingling real bad - needs to know what to do.

## 2012-07-27 ENCOUNTER — Telehealth: Payer: Self-pay | Admitting: Family Medicine

## 2012-07-27 NOTE — Telephone Encounter (Signed)
Please call patient back asap regarding the condition she's experiencing.  Last correspondance was 1/13.

## 2012-07-27 NOTE — Telephone Encounter (Signed)
Fwd. To Dr.Losq. Lorenda Hatchet, Renato Battles

## 2012-07-28 NOTE — Telephone Encounter (Signed)
Patient needs to be seen in clinic for this. Will forward to support staff to call patient and let her know.

## 2012-07-30 ENCOUNTER — Encounter: Payer: Self-pay | Admitting: Family Medicine

## 2012-07-30 ENCOUNTER — Ambulatory Visit (INDEPENDENT_AMBULATORY_CARE_PROVIDER_SITE_OTHER): Payer: 59 | Admitting: Family Medicine

## 2012-07-30 VITALS — BP 130/83 | HR 69 | Temp 98.4°F | Ht 67.0 in | Wt 203.3 lb

## 2012-07-30 DIAGNOSIS — N643 Galactorrhea not associated with childbirth: Secondary | ICD-10-CM

## 2012-07-30 DIAGNOSIS — R209 Unspecified disturbances of skin sensation: Secondary | ICD-10-CM

## 2012-07-30 DIAGNOSIS — F329 Major depressive disorder, single episode, unspecified: Secondary | ICD-10-CM

## 2012-07-30 DIAGNOSIS — R202 Paresthesia of skin: Secondary | ICD-10-CM

## 2012-07-30 DIAGNOSIS — F32A Depression, unspecified: Secondary | ICD-10-CM

## 2012-07-30 NOTE — Patient Instructions (Signed)
For the breast tenderness, you can take over the counter ibuprofen 3 tablets every 6 hours as needed. Stop the mobic.   I'll call you with the results.

## 2012-07-30 NOTE — Telephone Encounter (Signed)
See previous messages. I have advised pt to be evaluated and have OV. Called pt again and left message to call us back.  Lorenda Hatchet, Renato Battles

## 2012-07-31 NOTE — Progress Notes (Signed)
Patient ID: [name]    DOB: Jan 06, 1986, 27 y.o.   MRN: [id number] --- Subjective:  [name] is a 27 y.o.female with h/o depression who presents with persistent galactorrhea and new onset breast pain and itching.  - pain started 2 weeks ago, throbbing, bilateral breasts, every day, worst with pressure to the breast. Feels like breast have been increasing in size. She also reports itching in both breast. No associated rash. She continues to experience bilateral, whitish nipple discharge, non bloody,both expressed and spontaneous.  LMP: 07/11/12, last intercourse: 2 weeks ago. Not on birth control.  - migraine headaches: better since started on propranolol but still occur. She does report vision changes where vision is darkened for a few seconds.   - twitching in hands and legs. Occurs daily. Now with bilateral hand tremor, worst since prozac was stopped. prozac was d/ced in case it was a cause of the twitching.  BMP was done and was wnl.   ROS: see HPI Past Medical History: reviewed and updated medications and allergies. Social History: Tobacco: none  Objective: Filed Vitals:   07/30/12 1514  BP: 130/83  Pulse: 69  Temp: 98.4 F (36.9 C)    Physical Examination:   General appearance - alert, appearing mildly anxious Breast - large, symmetric breast, tender with palpation, fibrocystic changes, no obvious mass, no discharge, no rash

## 2012-07-31 NOTE — Assessment & Plan Note (Signed)
Associated with muscle "twitching". Electrolytes recently within normal limits. Unclear etiology at this point. Brain imaging to be obtained for eval of galactorrhea and headache.

## 2012-07-31 NOTE — Assessment & Plan Note (Signed)
Currently not getting SSRI as we were trying to exclude medications that might be causing patient's symptoms. Will start citalopram once current workup has been done.

## 2012-07-31 NOTE — Assessment & Plan Note (Addendum)
With persistent galactorrhea and new onset breast pain, will obtain breast imaging including ultrasound and mammogram.  Also, with patient's headaches, change in vision and galactorrhea, prolactinoma is included in the differential. Prolactin was normal, but could have been checked at a low point in time. WIll obtain brain MRI to rule out supracellar mass.  Discussed case with Dr. Mauricio Po.

## 2012-08-02 ENCOUNTER — Ambulatory Visit
Admission: RE | Admit: 2012-08-02 | Discharge: 2012-08-02 | Disposition: A | Discharge status: Home / Self Care | Payer: 59 | Source: Ambulatory Visit | Attending: Family Medicine | Admitting: Family Medicine

## 2012-08-02 ENCOUNTER — Ambulatory Visit (HOSPITAL_COMMUNITY)
Admission: RE | Admit: 2012-08-02 | Discharge: 2012-08-02 | Disposition: A | Discharge status: Home / Self Care | Payer: 59 | Source: Ambulatory Visit | Attending: Family Medicine | Admitting: Family Medicine

## 2012-08-02 ENCOUNTER — Other Ambulatory Visit: Payer: Self-pay | Admitting: Family Medicine

## 2012-08-02 DIAGNOSIS — N643 Galactorrhea not associated with childbirth: Secondary | ICD-10-CM

## 2012-08-02 DIAGNOSIS — R51 Headache: Secondary | ICD-10-CM | POA: Insufficient documentation

## 2012-08-02 MED ORDER — GADOBENATE DIMEGLUMINE 529 MG/ML IV SOLN
20.0000 mL | Freq: Once | INTRAVENOUS | Status: AC | PRN
  Administered 2012-08-02: 20 mL via INTRAVENOUS

## 2012-08-06 ENCOUNTER — Telehealth: Payer: Self-pay | Admitting: *Deleted

## 2012-08-06 NOTE — Telephone Encounter (Signed)
Message copied by Jennette Bill on Mon Aug 06, 2012 10:53 AM ------      Message from: Marena Chancy E      Created: Mon Aug 06, 2012  9:59 AM       Hello Molly Maduro,      Would you mind calling Ms. McCauley to let her know that her head MRI is normal. I will try calling her tomorrow to answer any extra questions she may have, but I'm not able to call today and thought she would probably like to know the results sooner than later.       Thank you so much,      Judeth Cornfield

## 2012-08-06 NOTE — Telephone Encounter (Signed)
Called and informed patient of normal MRI results, also that Dr Gwenlyn Saran will call and discuss further tomorrow.[name], Rodena Medin

## 2012-08-07 ENCOUNTER — Ambulatory Visit: Payer: 59 | Admitting: Family Medicine

## 2012-08-08 ENCOUNTER — Telehealth: Payer: Self-pay | Admitting: Family Medicine

## 2012-08-08 DIAGNOSIS — F329 Major depressive disorder, single episode, unspecified: Secondary | ICD-10-CM

## 2012-08-08 MED ORDER — FLUOXETINE HCL 40 MG PO CAPS: 40.0000 mg | ORAL_CAPSULE | Freq: Every day | ORAL | Status: DC

## 2012-08-08 MED ORDER — FLUOXETINE HCL (PMDD) 20 MG PO CAPS: 20.0000 mg | ORAL_CAPSULE | Freq: Every day | ORAL | Status: DC

## 2012-08-08 NOTE — Telephone Encounter (Signed)
Her prozac is the medication she is referring to

## 2012-08-08 NOTE — Telephone Encounter (Signed)
Forward to PCP.

## 2012-08-08 NOTE — Telephone Encounter (Signed)
Pt is needing to know if she needs to go back on her meds that Dr Gwenlyn Saran took her off of.  Needs to know today please

## 2012-08-08 NOTE — Telephone Encounter (Signed)
Left message for patient to return call. WHAT MEDICATION is she referring to??Jeremey Bascom, Rodena Medin

## 2012-08-08 NOTE — Telephone Encounter (Signed)
Called patient about restarting the prozac. Told her she could start back on 20mg  daily. Will send the Rx for 20mg  for 2 weeks. She can then increase to 40mg  daily. Will see her back in 3 weeks to discuss depression as well as back pain.  Regarding nipple discharge, I do not know what may be causing it since she has normal prolactin and normal MRI.  Will follow up. Recommended no pressure on breast and ibuprofen for breast pain. Patient expressed understanding.   Marena Chancy, PGY-2 Family Medicine Resident

## 2012-08-22 ENCOUNTER — Ambulatory Visit: Payer: 59 | Admitting: Family Medicine

## 2012-10-11 ENCOUNTER — Telehealth: Payer: Self-pay | Admitting: Family Medicine

## 2012-10-11 ENCOUNTER — Ambulatory Visit (INDEPENDENT_AMBULATORY_CARE_PROVIDER_SITE_OTHER): Payer: 59 | Admitting: Family Medicine

## 2012-10-11 ENCOUNTER — Encounter: Payer: Self-pay | Admitting: Family Medicine

## 2012-10-11 VITALS — BP 130/80 | HR 82 | Ht 67.0 in | Wt 205.4 lb

## 2012-10-11 DIAGNOSIS — G43909 Migraine, unspecified, not intractable, without status migrainosus: Secondary | ICD-10-CM

## 2012-10-11 DIAGNOSIS — F329 Major depressive disorder, single episode, unspecified: Secondary | ICD-10-CM

## 2012-10-11 DIAGNOSIS — M549 Dorsalgia, unspecified: Secondary | ICD-10-CM

## 2012-10-11 MED ORDER — ALBUTEROL SULFATE HFA 108 (90 BASE) MCG/ACT IN AERS: 2.0000 | INHALATION_SPRAY | RESPIRATORY_TRACT | Status: DC | PRN

## 2012-10-11 MED ORDER — TRAZODONE HCL 50 MG PO TABS: 25.0000 mg | ORAL_TABLET | Freq: Every evening | ORAL | Status: DC | PRN

## 2012-10-11 MED ORDER — VENLAFAXINE HCL 37.5 MG PO TABS: 37.5000 mg | ORAL_TABLET | Freq: Two times a day (BID) | ORAL | Status: DC

## 2012-10-11 MED ORDER — CYCLOBENZAPRINE HCL 10 MG PO TABS: 10.0000 mg | ORAL_TABLET | Freq: Three times a day (TID) | ORAL | Status: DC | PRN

## 2012-10-11 MED ORDER — PROPRANOLOL HCL ER 80 MG PO CP24: 80.0000 mg | ORAL_CAPSULE | Freq: Every day | ORAL | Status: DC

## 2012-10-11 NOTE — Telephone Encounter (Signed)
Sent Rx for albuterol refill.

## 2012-10-11 NOTE — Patient Instructions (Addendum)

## 2012-10-11 NOTE — Assessment & Plan Note (Signed)
Propranolol appears to help. Patient has run out of medicine. Refill propranolol today.

## 2012-10-11 NOTE — Assessment & Plan Note (Signed)
Patient on prozac for 2 months now without significant mood change. Will switch to SNRI. Start effexor 37.5mg  bid. PAtient to follow up 2-3 weeks after starting it.

## 2012-10-11 NOTE — Progress Notes (Signed)
Patient ID: [name]    DOB: March 04, 1986, 27 y.o.   MRN: [id number] --- Subjective:  [name] is a 27 y.o.female who presents for follow up on back pain and depression.  - depression: has been back on prozac 40mg  daily since early February. She feels like it is not helping anymore. She continues to have crying spells and feeling isolated from the world.  She does sometimes feel like she would be better gone, but she denies any current SI or HI and has no plan or means to kill herself.  She feels like there are days she would prefer staying in her room all day and not interact with anybody.  PHQ9: 20 - back pain: lower and mid back, feeling of spasms and "locking up" in back. Throbbing pain. 8/10 when they occur.lasts a couple of minutes when it occurs.  mobic not helping as much. Tramadol did not help in the past. Heating pads help a little. Walking makes it worst.   ROS: see HPI Past Medical History: reviewed and updated medications and allergies. Social History: Tobacco:  Objective: Filed Vitals:   10/11/12 1056  BP: 130/80  Pulse: 82    Physical Examination:   General appearance - alert, well appearing, and in no distress Chest - clear to auscultation, no wheezes, rales or rhonchi, symmetric air entry Heart - normal rate, regular rhythm, normal S1, S2, no murmurs, rubs, clicks or gallops Back - paraspinal tenderness bilaterally at thoracic and lumbar level.  5/5 strength in lower extremities bilaterally, negative straight leg b/l

## 2012-10-11 NOTE — Assessment & Plan Note (Addendum)
Significant muscle tightness. Start flexeril for muscle spasms. Continue mobic. Low back exercises given. Red flags for return reviewed: weakness in lower extremities, incontinence

## 2012-10-11 NOTE — Telephone Encounter (Signed)
Fwd. To Dr.Losq for refill .Marland Kitchenme

## 2012-10-11 NOTE — Telephone Encounter (Signed)
Patient was in this morning and went to pharmacy to get her prescriptions but the one for her inhaler was not there.  She is calling to remind Dr. Gwenlyn Saran and will check back with her pharmacy around the end of the day.

## 2012-12-04 ENCOUNTER — Ambulatory Visit (INDEPENDENT_AMBULATORY_CARE_PROVIDER_SITE_OTHER): Payer: 59 | Admitting: Family Medicine

## 2012-12-04 ENCOUNTER — Encounter: Payer: Self-pay | Admitting: Family Medicine

## 2012-12-04 VITALS — BP 132/85 | HR 80 | Temp 98.7°F | Ht 67.0 in | Wt 206.0 lb

## 2012-12-04 DIAGNOSIS — Z5189 Encounter for other specified aftercare: Secondary | ICD-10-CM

## 2012-12-04 DIAGNOSIS — S76919A Strain of unspecified muscles, fascia and tendons at thigh level, unspecified thigh, initial encounter: Secondary | ICD-10-CM | POA: Insufficient documentation

## 2012-12-04 DIAGNOSIS — S76912D Strain of unspecified muscles, fascia and tendons at thigh level, left thigh, subsequent encounter: Secondary | ICD-10-CM

## 2012-12-04 DIAGNOSIS — F329 Major depressive disorder, single episode, unspecified: Secondary | ICD-10-CM

## 2012-12-04 MED ORDER — NAPROXEN 500 MG PO TABS: 500.0000 mg | ORAL_TABLET | Freq: Two times a day (BID) | ORAL | Status: DC

## 2012-12-04 MED ORDER — TRAMADOL HCL 50 MG PO TABS: 100.0000 mg | ORAL_TABLET | Freq: Three times a day (TID) | ORAL | Status: DC | PRN

## 2012-12-04 MED ORDER — CYCLOBENZAPRINE HCL 10 MG PO TABS: 10.0000 mg | ORAL_TABLET | Freq: Three times a day (TID) | ORAL | Status: DC | PRN

## 2012-12-04 NOTE — Assessment & Plan Note (Addendum)
Exam consistent with L iliopsoas muscle strain and no red flags including B/B incontinence, sciatic pain, lumbar bony tenderness, negative straight leg raise/seated leg raise B/L.  Pain worse with hip flexion on the left which is consistent with this dx.  Does have some paraspinal TTP on the left side at around L1-L2 and some inguinal hip pain.  No fevers, chills, weight loss, adominal pain, N/V/D, constipation, BRBPR.  Will continue with her flexeril 2-3 x per day as tolerated, ultram as directed, switch from mobic to Naproxen BID, went over stretching exercises, discussed core strengthening preventing recurrent episodes, heat to the area as no longer acute area, and no heavy lifting/proper lifting at work with no prolonged periods of rest.  Will see back in 2-4 weeks to reassess.

## 2012-12-04 NOTE — Progress Notes (Signed)
[name] Records is a 27 y.o. who presents today for back pain and hip pain, and subseuquently wanted to discuss her depression  Hip/Back pain has been ongoing for several months now.  Has tried flexeril and ultram with some success but no success with mobic.  No tingling,numbness, muscle weakness, recent trauma, aggravating injury/lifting, weight loss, fevers, bony tenderness, bowel bladder incontinence.  Pain is mostly limited to the inguinal region on the left with some paraspinal muscle tenderness.  Pain worse with hip flexion, back extension but no radiation with either.    Depression - States recently switched from prozac to effexor, sometimes she feels better sometimes she doesn't.  No SI/HI, interest is little improved, but overall no changes.  Has been cutting herself with small kitchen knife on the L arm, but no intention to hurt herself or others.     Past Medical History  Diagnosis Date  . Allergy     seasonal allergies  . Pelvic cyst     Seen by gynecologist Dr. Tenny Craw. Scheduled for ex lap on 12/02/11  . Pneumonia   . Depression   . Fibroids      Physical Exam Filed Vitals:   12/04/12 1041  BP: 132/85  Pulse: 80  Temp: 98.7 F (37.1 C)    Gen: NAD, Well nourished, Well developed.  Flat affect  Cardio: RRR, No murmurs/gallops/rubs Lungs: CTA, no wheezes, rhonchi, crackles Neuro: CN 2-12 intact, MS 5/5 B/L UE and LE, +2 patellar and achilles relfex b/l  Skin: Small superficial healing 0.5 - 1 cm on the left anterior forearm Back Exam: 1.Gait  1. Walk on heels (L5 root) yes  Walk on toes (S1 root) yes 2. TTP along Lumbar Vertebrae - yes, paraspinal muscle tightness L1-L2/L3 3. Pain with :   1) Extension - yes -   2) Flexion - no 4. One Legged Hyperextension for Spondy - no  5. Straight Leg Raise no  @  1) Radiation into opposite leg - no   2) Worse with Dorsiflexion of ankle no  6. Sitting Leg Raise - no  7. DTR - + 2/4 B/L LE  8. MS - 4/5 LLE secondary to  pain, 5/5 RLE  9. Hip Exam - Full Active/Passive ROM on the R, Pain with hip flexion and adduction on the L

## 2012-12-04 NOTE — Patient Instructions (Addendum)
Xandria, you most likely have a chronic strain along with a new strain of a muscle in your back.  Please try the following.  1) We will use flexeril 5 mg 2-3 times per day as tolerated (it doesn't make you sleep).  2) Please continue taking the ultram as prescribed.  3) Stop taking the Mobic and start taking Naproxen, 1 pill in the morning and one at night.  4) Please use the heating pad to the area multiple times a day, but most importantly at night.  5) Perform some of the stretching exercises we discussed in clinic as well as using proper lifting technique at work as well as trying not to let the area tighten up.   Muscle Strain Muscle strain occurs when a muscle is stretched beyond its normal length. A small number of muscle fibers generally are torn. This is especially common in athletes. This happens when a sudden, violent force placed on a muscle stretches it too far. Usually, recovery from muscle strain takes 1 to 2 weeks. Complete healing will take 5 to 6 weeks.  SEEK MEDICAL CARE IF:  You have increasing pain or swelling in the injured area. MAKE SURE YOU:   Understand these instructions.  Will watch your condition.  Will get help right away if you are not doing well or get worse. Document Released: 06/20/2005 Document Revised: 09/12/2011 Document Reviewed: 07/02/2011 [facility] Patient Information 2014 [place], [place].

## 2012-12-04 NOTE — Assessment & Plan Note (Signed)
Switched to Effexor about 2 months ago now, with inconsistent information.  At times says this medication is making her depression worse, at times it is making it better.  At times it is worse than Prozac at times it is better.  Pt with flat mood but no current SI/HI ideations.  Has been superficially cutting herself for the past several months.  When asked about this, she states it makes her feel better.  However, only does it a couple of times per week.  Will make appointment to see in the next week, at which point she may benefit from Effexor increase as well as adjunct therapy with seroquel/abilify and possibly behavioral health referral.  Discussed with patient that she needs to call us or go to the ED if starts to have SI/HI or extreme changes in her mood.

## 2012-12-11 ENCOUNTER — Ambulatory Visit: Payer: 59 | Admitting: Family Medicine

## 2012-12-28 ENCOUNTER — Ambulatory Visit: Payer: 59 | Admitting: Family Medicine

## 2013-01-10 ENCOUNTER — Emergency Department (HOSPITAL_COMMUNITY)
Admission: EM | Admit: 2013-01-10 | Discharge: 2013-01-10 | Disposition: A | Discharge status: Home / Self Care | Payer: 59 | Attending: Emergency Medicine | Admitting: Emergency Medicine

## 2013-01-10 ENCOUNTER — Encounter (HOSPITAL_COMMUNITY): Payer: Self-pay | Admitting: Emergency Medicine

## 2013-01-10 ENCOUNTER — Emergency Department (HOSPITAL_COMMUNITY): Payer: 59

## 2013-01-10 DIAGNOSIS — M25579 Pain in unspecified ankle and joints of unspecified foot: Secondary | ICD-10-CM | POA: Insufficient documentation

## 2013-01-10 DIAGNOSIS — X500XXA Overexertion from strenuous movement or load, initial encounter: Secondary | ICD-10-CM | POA: Insufficient documentation

## 2013-01-10 DIAGNOSIS — Z8701 Personal history of pneumonia (recurrent): Secondary | ICD-10-CM | POA: Insufficient documentation

## 2013-01-10 DIAGNOSIS — Z8742 Personal history of other diseases of the female genital tract: Secondary | ICD-10-CM | POA: Insufficient documentation

## 2013-01-10 DIAGNOSIS — K089 Disorder of teeth and supporting structures, unspecified: Secondary | ICD-10-CM | POA: Insufficient documentation

## 2013-01-10 DIAGNOSIS — J45909 Unspecified asthma, uncomplicated: Secondary | ICD-10-CM | POA: Insufficient documentation

## 2013-01-10 DIAGNOSIS — F3289 Other specified depressive episodes: Secondary | ICD-10-CM | POA: Insufficient documentation

## 2013-01-10 DIAGNOSIS — Z880 Allergy status to penicillin: Secondary | ICD-10-CM | POA: Insufficient documentation

## 2013-01-10 DIAGNOSIS — Y9301 Activity, walking, marching and hiking: Secondary | ICD-10-CM | POA: Insufficient documentation

## 2013-01-10 DIAGNOSIS — K0889 Other specified disorders of teeth and supporting structures: Secondary | ICD-10-CM

## 2013-01-10 DIAGNOSIS — F329 Major depressive disorder, single episode, unspecified: Secondary | ICD-10-CM | POA: Insufficient documentation

## 2013-01-10 DIAGNOSIS — S93409A Sprain of unspecified ligament of unspecified ankle, initial encounter: Secondary | ICD-10-CM | POA: Insufficient documentation

## 2013-01-10 DIAGNOSIS — Z791 Long term (current) use of non-steroidal anti-inflammatories (NSAID): Secondary | ICD-10-CM | POA: Insufficient documentation

## 2013-01-10 DIAGNOSIS — Y929 Unspecified place or not applicable: Secondary | ICD-10-CM | POA: Insufficient documentation

## 2013-01-10 DIAGNOSIS — Z79899 Other long term (current) drug therapy: Secondary | ICD-10-CM | POA: Insufficient documentation

## 2013-01-10 DIAGNOSIS — M25571 Pain in right ankle and joints of right foot: Secondary | ICD-10-CM

## 2013-01-10 HISTORY — DX: Unspecified asthma, uncomplicated: J45.909

## 2013-01-10 MED ORDER — HYDROCODONE-ACETAMINOPHEN 5-325 MG PO TABS
2.0000 | ORAL_TABLET | Freq: Once | ORAL | Status: AC
  Administered 2013-01-10: 2 via ORAL
  Filled 2013-01-10: qty 2

## 2013-01-10 MED ORDER — CLINDAMYCIN HCL 150 MG PO CAPS: 300.0000 mg | ORAL_CAPSULE | Freq: Three times a day (TID) | ORAL | Status: DC

## 2013-01-10 MED ORDER — HYDROCODONE-ACETAMINOPHEN 5-325 MG PO TABS: 2.0000 | ORAL_TABLET | Freq: Four times a day (QID) | ORAL | Status: DC | PRN

## 2013-01-10 NOTE — ED Notes (Signed)
Pt complains of mouth pain x 1 day. Pt also complains of right ankle pain.

## 2013-01-10 NOTE — ED Provider Notes (Signed)
History    This chart was scribed for non-physician practitioner Roxy Horseman PA-C, working with Nelia Shi, MD by Donne Anon, ED Scribe. This patient was seen in room WTR6/WTR6 and the patient's care was started at 1625.  CSN: [id number] Arrival date & time 01/10/13  1557  First MD Initiated Contact with Patient 01/10/13 1625     Chief Complaint  Patient presents with  . Dental Pain    The history is provided by the patient. No language interpreter was used.   HPI Comments: [name] is a 27 y.o. female who presents to the Emergency Department complaining of 3 days of gradual onset, gradually worsening dental pain. She reports associated fever (100.5). She has tried Tylenol with little relief. She states her dentist is on vacation this week.   She also complains or right ankle pain that began on her way to the ED. She states she was walking and rolled her ankle outward, causing her to fall. She denies hitting her head or LOC.   Past Medical History  Diagnosis Date  . Allergy     seasonal allergies  . Pelvic cyst     Seen by gynecologist Dr. Tenny Craw. Scheduled for ex lap on 12/02/11  . Pneumonia   . Depression   . Fibroids   . Asthma    Past Surgical History  Procedure Laterality Date  . No past surgeries    . Myomectomy  12/02/2011    Procedure: MYOMECTOMY;  Surgeon: Miguel Aschoff, MD;  Location: [facility];  Service: Gynecology;  Laterality: N/A;   No family history on file. History  Substance Use Topics  . Smoking status: Never Smoker   . Smokeless tobacco: Not on file  . Alcohol Use: No     Comment: socially   OB History   Grav Para Term Preterm Abortions TAB SAB Ect Mult Living                 Review of Systems A complete 10 system review of systems was obtained and all systems are negative except as noted in the HPI and PMH.   Allergies  Dilaudid and Penicillins  Home Medications   Current Outpatient Rx  Name  Route  Sig  Dispense  Refill  .  albuterol (PROVENTIL HFA;VENTOLIN HFA) 108 (90 BASE) MCG/ACT inhaler   Inhalation   Inhale 2 puffs into the lungs every 4 (four) hours as needed. Wheezing or shortness of breath.   3.7 g   3   . cyclobenzaprine (FLEXERIL) 10 MG tablet   Oral   Take 1 tablet (10 mg total) by mouth 3 (three) times daily as needed for muscle spasms.   90 tablet   0   . FLUoxetine (PROZAC) 40 MG capsule   Oral   Take 1 capsule (40 mg total) by mouth daily.   90 capsule   3   . Fluoxetine HCl, PMDD, 20 MG CAPS   Oral   Take 1 capsule (20 mg total) by mouth daily.   20 each   0     Take for 2 weeks then increase to 40mg  daily.   . naproxen (NAPROSYN) 500 MG tablet   Oral   Take 1 tablet (500 mg total) by mouth 2 (two) times daily with a meal.   60 tablet   0   . propranolol ER (INDERAL LA) 80 MG 24 hr capsule   Oral   Take 1 capsule (80 mg total) by mouth daily.  30 capsule   0   . SUMAtriptan (IMITREX) 50 MG tablet   Oral   Take 1 tablet (50 mg total) by mouth every 2 (two) hours as needed for migraine.   10 tablet   0   . traMADol (ULTRAM) 50 MG tablet   Oral   Take 2 tablets (100 mg total) by mouth every 8 (eight) hours as needed for pain.   90 tablet   0   . traZODone (DESYREL) 50 MG tablet   Oral   Take 0.5-1 tablets (25-50 mg total) by mouth at bedtime as needed for sleep.   30 tablet   1   . venlafaxine (EFFEXOR) 37.5 MG tablet   Oral   Take 1 tablet (37.5 mg total) by mouth 2 (two) times daily.   60 tablet   1    BP 139/77  Pulse 98  Temp(Src) 99.3 F (37.4 C) (Oral)  Resp 18  SpO2 100%  LMP 12/27/2012  Physical Exam  Nursing note and vitals reviewed. Constitutional: She appears well-developed and well-nourished. No distress.  HENT:  Head: Normocephalic and atraumatic.  Mouth/Throat:    Poor dentition throughout.  Affected tooth as diagrammed.  No signs of peritonsillar or tonsillar abscess.  No signs of gingival abscess. Oropharynx is clear and  without exudates.  Uvula is midline.  Airway is intact. No signs of Ludwig's angina.   Eyes: Conjunctivae are normal.  Neck: Neck supple. No tracheal deviation present.  Cardiovascular: Normal rate.   Pulmonary/Chest: Effort normal. No respiratory distress.  Musculoskeletal: Normal range of motion.  Right ankle tender to palpation over the lateral aspect. Mild swelling noted. Pt unwilling to bear weight.   Neurological: She is alert.  Skin: Skin is warm and dry.  Psychiatric: She has a normal mood and affect. Her behavior is normal.    ED Course  Procedures (including critical care time) DIAGNOSTIC STUDIES: Oxygen Saturation is 100% on RA, normal by my interpretation.    COORDINATION OF CARE: 4:30 PM Discussed treatment plan which includes antibiotics and pain medication with pt at bedside and pt agreed to plan. Will give dental referral. I will obtain an xray of her ankle.    Labs Reviewed - No data to display Dg Ankle Complete Right  01/10/2013   *RADIOLOGY REPORT*  Clinical Data: Pain post trauma  RIGHT ANKLE - COMPLETE 3+ VIEW  Comparison: None.  Findings: Frontal, oblique, and lateral views were obtained.  There is no fracture or effusion.  Ankle mortise appears intact.  IMPRESSION: No abnormality noted.   Original Report Authenticated By: Bretta Bang, M.D.   1. Pain, dental   2. Ankle pain, right     MDM  Uncomplicated dental pain and ankle sprain.  F/u with dentist and ortho.  Pain meds, and abx.  I personally performed the services described in this documentation, which was scribed in my presence. The recorded information has been reviewed and is accurate.      Roxy Horseman, PA-C 01/10/13 [phone]

## 2013-01-11 ENCOUNTER — Ambulatory Visit (INDEPENDENT_AMBULATORY_CARE_PROVIDER_SITE_OTHER): Payer: 59 | Admitting: Family Medicine

## 2013-01-11 ENCOUNTER — Encounter: Payer: Self-pay | Admitting: Family Medicine

## 2013-01-11 VITALS — BP 125/87 | HR 118 | Temp 99.3°F | Ht 68.0 in | Wt 207.0 lb

## 2013-01-11 DIAGNOSIS — S76912D Strain of unspecified muscles, fascia and tendons at thigh level, left thigh, subsequent encounter: Secondary | ICD-10-CM

## 2013-01-11 DIAGNOSIS — K044 Acute apical periodontitis of pulpal origin: Secondary | ICD-10-CM

## 2013-01-11 DIAGNOSIS — Z5189 Encounter for other specified aftercare: Secondary | ICD-10-CM

## 2013-01-11 DIAGNOSIS — K047 Periapical abscess without sinus: Secondary | ICD-10-CM

## 2013-01-11 DIAGNOSIS — F329 Major depressive disorder, single episode, unspecified: Secondary | ICD-10-CM

## 2013-01-11 DIAGNOSIS — K089 Disorder of teeth and supporting structures, unspecified: Secondary | ICD-10-CM

## 2013-01-11 DIAGNOSIS — K0889 Other specified disorders of teeth and supporting structures: Secondary | ICD-10-CM

## 2013-01-11 DIAGNOSIS — G43909 Migraine, unspecified, not intractable, without status migrainosus: Secondary | ICD-10-CM

## 2013-01-11 MED ORDER — PROPRANOLOL HCL ER 80 MG PO CP24: 80.0000 mg | ORAL_CAPSULE | Freq: Every day | ORAL | Status: DC

## 2013-01-11 MED ORDER — VENLAFAXINE HCL 75 MG PO TABS: 75.0000 mg | ORAL_TABLET | Freq: Two times a day (BID) | ORAL | Status: DC

## 2013-01-11 MED ORDER — NAPROXEN 500 MG PO TABS: 500.0000 mg | ORAL_TABLET | Freq: Two times a day (BID) | ORAL | Status: DC

## 2013-01-11 MED ORDER — CLINDAMYCIN HCL 150 MG PO CAPS: 300.0000 mg | ORAL_CAPSULE | Freq: Three times a day (TID) | ORAL | Status: DC

## 2013-01-11 MED ORDER — CYCLOBENZAPRINE HCL 10 MG PO TABS: 10.0000 mg | ORAL_TABLET | Freq: Three times a day (TID) | ORAL | Status: DC | PRN

## 2013-01-11 MED ORDER — KETOROLAC TROMETHAMINE 30 MG/ML IM SOLN: 30.0000 mg | Freq: Once | INTRAMUSCULAR | Status: DC

## 2013-01-11 MED ORDER — KETOROLAC TROMETHAMINE 60 MG/2ML IJ SOLN
30.0000 mg | Freq: Once | INTRAMUSCULAR | Status: AC
  Administered 2013-01-11: 30 mg via INTRAMUSCULAR

## 2013-01-11 MED ORDER — TRAMADOL HCL 50 MG PO TABS: 100.0000 mg | ORAL_TABLET | Freq: Three times a day (TID) | ORAL | Status: DC | PRN

## 2013-01-11 NOTE — Patient Instructions (Addendum)
For the mood, I'll send you names of therapists. We're increasing the medicine. I'd like to see you back in 1 month.

## 2013-01-12 ENCOUNTER — Telehealth: Payer: Self-pay | Admitting: Family Medicine

## 2013-01-12 DIAGNOSIS — K0889 Other specified disorders of teeth and supporting structures: Secondary | ICD-10-CM | POA: Insufficient documentation

## 2013-01-12 NOTE — Progress Notes (Signed)
Patient ID: [name]    DOB: December 29, 1985, 27 y.o.   MRN: [id number] --- Subjective:  [name] is a 27 y.o.female who presents with dental pain and follow up on depression. - dental pain: was seen in ED on 01/10/13 and discharged with clindamycin and vicodin for pain control. She has not yet filled the medicines and continues to have tooth pain. Pain is located in left lower jaw. Started 4 days ago, throbbing, constant, burning with eating and drinking. Reports subjective fever. Some nausea, clear emesis. Had some swelling but now improved. Seen by dentist 1 year ago for teeth cleaning.   - depression: taking effexor 37.5mg  daily. Does fine while at work but once at home has bouts of sadness. Cries frequently. No SI/HI. Had history of cutting but has since stopped.  PHQ9: 25   ROS: see HPI Past Medical History: reviewed and updated medications and allergies. Social History: Tobacco:  none  Objective: Filed Vitals:   01/11/13 1037  BP: 125/87  Pulse: 118  Temp: 99.3 F (37.4 C)    Physical Examination:   General appearance - alert, tearful and in distress from pain Mouth - mucous membranes moist, no obvious soft tissue swelling, cavity present on left lower far molar, with tenderness to palpation Neck - supple, no significant adenopathy Chest - clear to auscultation, no wheezes, rales or rhonchi, symmetric air entry Heart - normal rate, regular rhythm, normal S1, S2, no murmurs, rubs, clicks or gallops

## 2013-01-12 NOTE — Assessment & Plan Note (Signed)
toradol IM in office for pain Antibiotics and vicodin Patient to make appointment with dentist next week

## 2013-01-12 NOTE — Assessment & Plan Note (Signed)
Not well controlled. Will increase effexor to 75mg  daily. Follow up in 3-4 weeks with repeat phq9.

## 2013-01-12 NOTE — Assessment & Plan Note (Signed)
Controled on propranolol. Refilled propranolol.

## 2013-01-12 NOTE — Telephone Encounter (Signed)
Emergency Line:  Was started on Clindamycin yesterday for dental infection. She is allergic to penicillin. States that she started taking medicine yesterday and she is now breaking out in hives. She states she feels like her mouth is more swollen and her throat is a little tight. No problems taking a deep breath but feels like she is breathing faster than usual. Advised patient to stop the antibiotic. She does not have benadryl at home, and since she is complaining that her throat may feel swollen, I have advised her to be evaluated today at Urgent Care or the [facility] ED. Patient agrees and will come to be seen.   Michael M. Aerionna Moravek, M.D. 01/12/2013 12:31 PM

## 2013-01-13 NOTE — ED Provider Notes (Signed)
Medical screening examination/treatment/procedure(s) were performed by non-physician practitioner and as supervising physician I was immediately available for consultation/collaboration.   Jeri Jeanbaptiste L Dirck Butch, MD 01/13/13 1112 

## 2013-02-08 ENCOUNTER — Ambulatory Visit (INDEPENDENT_AMBULATORY_CARE_PROVIDER_SITE_OTHER): Payer: 59 | Admitting: Family Medicine

## 2013-02-08 ENCOUNTER — Encounter: Payer: Self-pay | Admitting: Family Medicine

## 2013-02-08 VITALS — BP 128/81 | HR 60 | Temp 97.8°F | Wt 212.0 lb

## 2013-02-08 DIAGNOSIS — F329 Major depressive disorder, single episode, unspecified: Secondary | ICD-10-CM

## 2013-02-08 DIAGNOSIS — M545 Low back pain, unspecified: Secondary | ICD-10-CM

## 2013-02-08 DIAGNOSIS — R109 Unspecified abdominal pain: Secondary | ICD-10-CM

## 2013-02-08 DIAGNOSIS — F32A Depression, unspecified: Secondary | ICD-10-CM

## 2013-02-08 LAB — POCT URINALYSIS DIPSTICK
Glucose, UA: NEGATIVE
Protein, UA: NEGATIVE
Urobilinogen, UA: 0.2

## 2013-02-08 LAB — POCT URINE PREGNANCY: Preg Test, Ur: NEGATIVE

## 2013-02-08 MED ORDER — SULFAMETHOXAZOLE-TMP DS 800-160 MG PO TABS: 1.0000 | ORAL_TABLET | Freq: Two times a day (BID) | ORAL | Status: DC

## 2013-02-08 NOTE — Progress Notes (Signed)
Patient ID: [name]    DOB: 1985/09/26, 27 y.o.   MRN: [id number] --- Subjective:  [name] is a 27 y.o.female who presents for follow up on depression. Concerns are the following:  - depression: mood has improved on effexor 75mg  bid.She states that she doesn't feel "stuck" like she used to. She still has some anxiety especially at work in more stressful situations.  Continues to feel more depressed when she's at home alone than when she is at work.  Phq9: 13  - low back pain: throbbing pain in lower back, with muscle spasms. Sitting down helps. Standing for long periods of time makes it worst. She has not been doing heavy lifting at work. She has been doing low back stretches which help momentarily. She takes naproxen in am, tramadol in middle of the day and naproxen again in the pm. Also takes flexeril. She states that they help but don't make the pain completely go away.   - abdominal pain: sharp shooting pain in lower quadrants bilaterally. x1 week. Increased urinary frequency x1 week as well. No dysuria. Pain is ongoing and sharp. Not worsening. No constipation. Has one bowel movement per day. Some nausea, no vomiting.   ROS: see HPI Past Medical History: reviewed and updated medications and allergies. Social History: Tobacco: none  Objective: Filed Vitals:   02/08/13 0852  BP: 128/81  Pulse: 60  Temp: 97.8 F (36.6 C)   PHQ9: 13  Physical Examination:   General appearance - alert, well appearing, and in no distress Chest - clear to auscultation, no wheezes, rales or rhonchi, symmetric air entry Heart - normal rate, regular rhythm, normal S1, S2, no murmurs, rubs, clicks or gallops Abdomen - soft, tender in right and left lower quadrants, no rigidity , no rebound, no guarding. No CVA tenderness.  Back - tenderness along thoracic spine and lumbar spine, with paraspinal muscle spasms.  5/5 strength with knee flexion and extension, some decreased strength in right foot due to  previous injury, 5/5 strength plantar and dorsiflexion of foot on left. Patellar reflexes symmetric

## 2013-02-08 NOTE — Patient Instructions (Addendum)
Continue with the effexor.  I recommend that you meet with our psychologist, Dr. Spero Geralds, for help dealing with your anxiety.  You can schedule an appointment with her by calling her directly at [phone].   Start exercising to help strengthen the muscles of the back.    I will call you with the results of the urine.

## 2013-02-09 DIAGNOSIS — R109 Unspecified abdominal pain: Secondary | ICD-10-CM | POA: Insufficient documentation

## 2013-02-09 DIAGNOSIS — M545 Low back pain: Secondary | ICD-10-CM | POA: Insufficient documentation

## 2013-02-09 NOTE — Assessment & Plan Note (Addendum)
No red flags on exam. Continue current treatment and encouraged exercise for muscle strengthening and weight loss. Patient reported being interested in swimming. She has a pool where she lives. Encouraged swimming.  If continues to be a problem, consider plain xray but likely low yield.

## 2013-02-09 NOTE — Assessment & Plan Note (Signed)
Bilateral lower quadrant pain with increased urinary frequency and nausea. UA not very convincing but given symptoms, will treat for UTI with bactrim for 5 days. Reviewed red flags for return to clinic or ED (worsening pain, increased nausea, vomiting...)

## 2013-02-09 NOTE — Assessment & Plan Note (Signed)
Improving on effexor 75mg  bid. May need to increase dose if continues to have associated anxiety. Before increasing dose, would like for her to see therapist to discuss complementary ways to deal with depression/anxiety. Gave number to call Dr. Pascal Lux. Patient is open and agreeable to this.

## 2013-02-10 LAB — URINE CULTURE: Colony Count: NO GROWTH

## 2013-02-11 ENCOUNTER — Encounter: Payer: Self-pay | Admitting: Family Medicine

## 2013-03-26 ENCOUNTER — Emergency Department (HOSPITAL_COMMUNITY): Payer: 59

## 2013-03-26 ENCOUNTER — Encounter (HOSPITAL_COMMUNITY): Payer: Self-pay | Admitting: Emergency Medicine

## 2013-03-26 ENCOUNTER — Emergency Department (HOSPITAL_COMMUNITY)
Admission: EM | Admit: 2013-03-26 | Discharge: 2013-03-26 | Disposition: A | Discharge status: Home / Self Care | Payer: 59 | Attending: Emergency Medicine | Admitting: Emergency Medicine

## 2013-03-26 DIAGNOSIS — N898 Other specified noninflammatory disorders of vagina: Secondary | ICD-10-CM | POA: Insufficient documentation

## 2013-03-26 DIAGNOSIS — Z202 Contact with and (suspected) exposure to infections with a predominantly sexual mode of transmission: Secondary | ICD-10-CM | POA: Insufficient documentation

## 2013-03-26 DIAGNOSIS — Z880 Allergy status to penicillin: Secondary | ICD-10-CM | POA: Insufficient documentation

## 2013-03-26 DIAGNOSIS — A599 Trichomoniasis, unspecified: Secondary | ICD-10-CM

## 2013-03-26 DIAGNOSIS — Z3202 Encounter for pregnancy test, result negative: Secondary | ICD-10-CM | POA: Insufficient documentation

## 2013-03-26 DIAGNOSIS — R102 Pelvic and perineal pain: Secondary | ICD-10-CM

## 2013-03-26 DIAGNOSIS — Z8701 Personal history of pneumonia (recurrent): Secondary | ICD-10-CM | POA: Insufficient documentation

## 2013-03-26 DIAGNOSIS — R350 Frequency of micturition: Secondary | ICD-10-CM | POA: Insufficient documentation

## 2013-03-26 DIAGNOSIS — A64 Unspecified sexually transmitted disease: Secondary | ICD-10-CM

## 2013-03-26 DIAGNOSIS — R109 Unspecified abdominal pain: Secondary | ICD-10-CM

## 2013-03-26 DIAGNOSIS — J45909 Unspecified asthma, uncomplicated: Secondary | ICD-10-CM | POA: Insufficient documentation

## 2013-03-26 DIAGNOSIS — R112 Nausea with vomiting, unspecified: Secondary | ICD-10-CM | POA: Insufficient documentation

## 2013-03-26 DIAGNOSIS — F3289 Other specified depressive episodes: Secondary | ICD-10-CM | POA: Insufficient documentation

## 2013-03-26 DIAGNOSIS — D259 Leiomyoma of uterus, unspecified: Secondary | ICD-10-CM

## 2013-03-26 DIAGNOSIS — Z792 Long term (current) use of antibiotics: Secondary | ICD-10-CM | POA: Insufficient documentation

## 2013-03-26 DIAGNOSIS — Z79899 Other long term (current) drug therapy: Secondary | ICD-10-CM | POA: Insufficient documentation

## 2013-03-26 DIAGNOSIS — F329 Major depressive disorder, single episode, unspecified: Secondary | ICD-10-CM | POA: Insufficient documentation

## 2013-03-26 DIAGNOSIS — N83209 Unspecified ovarian cyst, unspecified side: Secondary | ICD-10-CM

## 2013-03-26 DIAGNOSIS — N949 Unspecified condition associated with female genital organs and menstrual cycle: Secondary | ICD-10-CM | POA: Insufficient documentation

## 2013-03-26 DIAGNOSIS — R079 Chest pain, unspecified: Secondary | ICD-10-CM | POA: Insufficient documentation

## 2013-03-26 LAB — BASIC METABOLIC PANEL
BUN: 12 mg/dL (ref 6–23)
CO2: 24 mEq/L (ref 19–32)
Chloride: 103 mEq/L (ref 96–112)
Creatinine, Ser: 0.73 mg/dL (ref 0.50–1.10)
Potassium: 3.7 mEq/L (ref 3.5–5.1)
Sodium: 136 mEq/L (ref 135–145)

## 2013-03-26 LAB — URINE MICROSCOPIC-ADD ON

## 2013-03-26 LAB — POCT PREGNANCY, URINE: Preg Test, Ur: NEGATIVE

## 2013-03-26 LAB — URINALYSIS, ROUTINE W REFLEX MICROSCOPIC
Protein, ur: NEGATIVE mg/dL
Urobilinogen, UA: 0.2 mg/dL (ref 0.0–1.0)

## 2013-03-26 LAB — CBC
HCT: 36.2 % (ref 36.0–46.0)
MCHC: 34.8 g/dL (ref 30.0–36.0)
MCV: 88.5 fL (ref 78.0–100.0)
Platelets: 256 10*3/uL (ref 150–400)
RBC: 4.09 MIL/uL (ref 3.87–5.11)
WBC: 6.1 10*3/uL (ref 4.0–10.5)

## 2013-03-26 LAB — WET PREP, GENITAL: Yeast Wet Prep HPF POC: NONE SEEN

## 2013-03-26 MED ORDER — ONDANSETRON HCL 8 MG PO TABS: 8.0000 mg | ORAL_TABLET | ORAL | Status: DC | PRN

## 2013-03-26 MED ORDER — HYDROCODONE-ACETAMINOPHEN 5-325 MG PO TABS: 1.0000 | ORAL_TABLET | ORAL | Status: DC | PRN

## 2013-03-26 MED ORDER — HYDROCODONE-ACETAMINOPHEN 5-325 MG PO TABS
2.0000 | ORAL_TABLET | Freq: Once | ORAL | Status: AC
  Administered 2013-03-26: 2 via ORAL
  Filled 2013-03-26: qty 2

## 2013-03-26 MED ORDER — METRONIDAZOLE 500 MG PO TABS
2000.0000 mg | ORAL_TABLET | Freq: Once | ORAL | Status: AC
  Administered 2013-03-26: 2000 mg via ORAL
  Filled 2013-03-26: qty 4

## 2013-03-26 MED ORDER — CEFTRIAXONE SODIUM 250 MG IJ SOLR
250.0000 mg | Freq: Once | INTRAMUSCULAR | Status: AC
  Administered 2013-03-26: 250 mg via INTRAMUSCULAR
  Filled 2013-03-26: qty 250

## 2013-03-26 MED ORDER — LIDOCAINE HCL 1 % IJ SOLN
INTRAMUSCULAR | Status: AC
  Administered 2013-03-26: 21:00:00
  Filled 2013-03-26: qty 20

## 2013-03-26 MED ORDER — AZITHROMYCIN 250 MG PO TABS
1000.0000 mg | ORAL_TABLET | Freq: Once | ORAL | Status: AC
  Administered 2013-03-26: 1000 mg via ORAL
  Filled 2013-03-26: qty 4

## 2013-03-26 MED ORDER — ONDANSETRON 8 MG PO TBDP
8.0000 mg | ORAL_TABLET | Freq: Once | ORAL | Status: AC
  Administered 2013-03-26: 8 mg via ORAL
  Filled 2013-03-26: qty 1

## 2013-03-26 NOTE — ED Provider Notes (Signed)
CSN: [id number]     Arrival date & time 03/26/13  1754 History   First MD Initiated Contact with Patient 03/26/13 1836     Chief Complaint  Patient presents with  . Pelvic Pain   (Consider location/radiation/quality/duration/timing/severity/associated sxs/prior Treatment) The history is provided by the patient and medical records. No language interpreter was used.    [name] Records is a 27 y.o. female  with a hx of uterine fibroids (removed with myomectomy 12/02/11), asthma presents to the Emergency Department complaining of gradual, persistent, progressively worsening pelvic pain and urinary frequency onset 3 weeks ago.  Pt was evaluated by her PCP several weeks ago for this, told it was a possible UTI and treated without relief. She reports that the urine culture was negative. Associated symptoms include nausea, urinary frequency, vomiting x2.  Laying on her side makes it better and sitting up makes it worse.  Pt is sexually active with 1 female partner for 1 year, without birth control or condom use.  Pt reports Hx of Chlamydia 4 years ago and she was treated for this.  Pt denies fever, headache, neck pain, chest pain, diarrhea, weakness, dizziness, syncope, dysuria, hematuria, vaginal discharge, pain with intercourse, vaginal bleeding.  LMP mid August.  Pt also reports SOB yesterday morning completely resolved with her albuterol inhaler.    Past Medical History  Diagnosis Date  . Allergy     seasonal allergies  . Pelvic cyst     Seen by gynecologist Dr. Tenny Craw. Scheduled for ex lap on 12/02/11  . Pneumonia   . Depression   . Fibroids   . Asthma    Past Surgical History  Procedure Laterality Date  . No past surgeries    . Myomectomy  12/02/2011    Procedure: MYOMECTOMY;  Surgeon: Miguel Aschoff, MD;  Location: [facility];  Service: Gynecology;  Laterality: N/A;   No family history on file. History  Substance Use Topics  . Smoking status: Never Smoker   . Smokeless tobacco: Not on file  .  Alcohol Use: No   OB History   Grav Para Term Preterm Abortions TAB SAB Ect Mult Living                 Review of Systems  Constitutional: Negative for fever, diaphoresis, appetite change, fatigue and unexpected weight change.  HENT: Negative for mouth sores, trouble swallowing, neck pain and neck stiffness.   Respiratory: Positive for chest tightness (resolved with albuterol use). Negative for cough, shortness of breath, wheezing and stridor.   Cardiovascular: Negative for chest pain and palpitations.  Gastrointestinal: Positive for nausea, vomiting and abdominal pain. Negative for diarrhea, constipation, blood in stool, abdominal distention and rectal pain.  Genitourinary: Positive for pelvic pain. Negative for dysuria, urgency, frequency, hematuria, flank pain, decreased urine volume, vaginal bleeding, vaginal discharge, difficulty urinating and vaginal pain.  Musculoskeletal: Negative for back pain.  Skin: Negative for rash.  Neurological: Negative for weakness.  Hematological: Negative for adenopathy.  Psychiatric/Behavioral: Negative for confusion.  All other systems reviewed and are negative.    Allergies  Dilaudid; Clindamycin/lincomycin; and Penicillins  Home Medications   Current Outpatient Rx  Name  Route  Sig  Dispense  Refill  . albuterol (PROVENTIL HFA;VENTOLIN HFA) 108 (90 BASE) MCG/ACT inhaler   Inhalation   Inhale 2 puffs into the lungs every 4 (four) hours as needed for wheezing or shortness of breath.         . Multiple Vitamin (MULTIVITAMIN WITH MINERALS) TABS tablet  Oral   Take 1 tablet by mouth daily.         . propranolol ER (INDERAL LA) 80 MG 24 hr capsule   Oral   Take 1 capsule (80 mg total) by mouth daily.   30 capsule   4   . SUMAtriptan (IMITREX) 50 MG tablet   Oral   Take 1 tablet (50 mg total) by mouth every 2 (two) hours as needed for migraine.   10 tablet   0   . traZODone (DESYREL) 50 MG tablet   Oral   Take 0.5-1 tablets  (25-50 mg total) by mouth at bedtime as needed for sleep.   30 tablet   1   . venlafaxine (EFFEXOR) 75 MG tablet   Oral   Take 1 tablet (75 mg total) by mouth 2 (two) times daily.   60 tablet   2   . HYDROcodone-acetaminophen (NORCO/VICODIN) 5-325 MG per tablet   Oral   Take 1 tablet by mouth every 4 (four) hours as needed for pain.   11 tablet   0   . ondansetron (ZOFRAN) 8 MG tablet   Oral   Take 1 tablet (8 mg total) by mouth every 4 (four) hours as needed for nausea.   4 tablet   0    BP 120/72  Pulse 85  Resp 20  Ht 5\' 7"  (1.702 m)  Wt 220 lb (99.791 kg)  BMI 34.45 kg/m2  SpO2 99%  LMP 02/14/2013 Physical Exam  Nursing note and vitals reviewed. Constitutional: She is oriented to person, place, and time. She appears well-developed and well-nourished. No distress.  HENT:  Head: Normocephalic and atraumatic.  Mouth/Throat: Oropharynx is clear and moist.  Eyes: Conjunctivae are normal. Pupils are equal, round, and reactive to light. No scleral icterus.  Neck: Normal range of motion. Neck supple.  Cardiovascular: Normal rate, regular rhythm, normal heart sounds and intact distal pulses.   No murmur heard. Pulmonary/Chest: Effort normal and breath sounds normal. No respiratory distress. She has no decreased breath sounds. She has no wheezes. She has no rhonchi. She has no rales. She exhibits no tenderness and no bony tenderness.  Abdominal: Soft. Normal appearance and bowel sounds are normal. She exhibits no distension and no mass. There is no hepatosplenomegaly. There is tenderness in the right lower quadrant, suprapubic area and left lower quadrant. There is guarding. There is no rebound and no CVA tenderness. Hernia confirmed negative in the right inguinal area and confirmed negative in the left inguinal area.    Well healed midline scar TTP low RLQ and LLQ as well as suprapubic  Genitourinary: Uterus normal. Pelvic exam was performed with patient prone. There is no  rash, tenderness, lesion or injury on the right labia. There is no rash, tenderness, lesion or injury on the left labia. Uterus is not deviated, not enlarged, not fixed and not tender. Cervix exhibits friability. Cervix exhibits no motion tenderness and no discharge. Right adnexum displays tenderness. Right adnexum displays no mass and no fullness. Left adnexum displays no mass, no tenderness and no fullness. No erythema, tenderness or bleeding around the vagina. No foreign body around the vagina. No signs of injury around the vagina. Vaginal discharge (green, frothy discharge) found.  Right adnexal tenderness Friable cervix Thin, green, probably vaginal discharge  Musculoskeletal: Normal range of motion. She exhibits no edema.  Lymphadenopathy:    She has no cervical adenopathy.       Right: No inguinal adenopathy present.  Left: No inguinal adenopathy present.  Neurological: She is alert and oriented to person, place, and time. No cranial nerve deficit. She exhibits normal muscle tone. Coordination normal.  Speech is clear and goal oriented Moves extremities without ataxia  Skin: Skin is warm and dry. No rash noted. She is not diaphoretic. No erythema.  Psychiatric: She has a normal mood and affect.    ED Course  Procedures (including critical care time) Labs Review Labs Reviewed  WET PREP, GENITAL - Abnormal; Notable for the following:    Trich, Wet Prep FEW (*)    Clue Cells Wet Prep HPF POC FEW (*)    WBC, Wet Prep HPF POC FEW (*)    All other components within normal limits  URINALYSIS, ROUTINE W REFLEX MICROSCOPIC - Abnormal; Notable for the following:    APPearance CLOUDY (*)    Leukocytes, UA LARGE (*)    All other components within normal limits  URINE MICROSCOPIC-ADD ON - Abnormal; Notable for the following:    Squamous Epithelial / LPF MANY (*)    Bacteria, UA MANY (*)    All other components within normal limits  GC/CHLAMYDIA PROBE AMP  URINE CULTURE  CBC  BASIC  METABOLIC PANEL  PREGNANCY, URINE  POCT PREGNANCY, URINE   Imaging Review US Transvaginal Non-ob  03/26/2013   CLINICAL DATA:  Pelvic pain  EXAM: TRANSABDOMINAL AND TRANSVAGINAL ULTRASOUND OF PELVIS  DOPPLER ULTRASOUND OF OVARIES  TECHNIQUE: Both transabdominal and transvaginal ultrasound examinations of the pelvis were performed. Transabdominal technique was performed for global imaging of the pelvis including uterus, ovaries, adnexal regions, and pelvic cul-de-sac.  It was necessary to proceed with endovaginal exam following the transabdominal exam to visualize the ovaries. Color and duplex Doppler ultrasound was utilized to evaluate blood flow to the ovaries.  COMPARISON:  12/12/2009  FINDINGS: Uterus  Measurements: 14.0 x 6.9 x 9.2 cm. Numerous fibroids cause a heterogeneous appearance of the uterus. Some index fibroids include a 5.0 x 4.0 x 4.3 cm right fundal fibroid; a 4.5 x 3.1 x 4.1 cm left fundal fibroid cm; and a 1.9 x 1.3 x 1.6 cm submucosal fibroid.  Endometrium  Thickness: 1.5 cm. Submucosal fibroid is noted above.  Right ovary  Measurements: 5.9 x 3.3 x 2.8 cm. There is a collapsed hemorrhagic cyst measuring 2.0 x 1.5 cm in the right ovary along with a complex 2.6 x 3.0 x 2.7 cm septated cystic lesion with tearing internal echogenicity. There is also an adjacent 4.4 x 2.9 x 3.7 cm simple appearing adnexal cyst adjacent to the right ovary. .  Left ovary  Measurements: Measures 3.7 x 2.1 x 1.8 cm and appears normal. Normal appearance/no adnexal mass.  Pulsed Doppler evaluation of both ovaries demonstrates normal low-resistance arterial and venous waveforms.  Trace free pelvic fluid.  IMPRESSION: 1. Uterine fibroids. 2. Complex cystic lesions in the right ovary. One of these has the appearance of a collapsed hemorrhagic cyst. Another has several septations separating complex appearing fluid. Followup pelvic sonography in 6 weeks time to ensure resolution and exclude neoplastic process is  recommended. Both of these are likely benign. 3. Simple appearing adnexal cyst adjacent to the right ovary may be exophytic from the ovary ; adjacent hydrosalpinx is less likely. 4. No findings of ovarian torsion.   Electronically Signed   By: Herbie Baltimore   On: 03/26/2013 20:49   US Pelvis Complete  03/26/2013   CLINICAL DATA:  Pelvic pain  EXAM: TRANSABDOMINAL AND TRANSVAGINAL ULTRASOUND OF PELVIS  DOPPLER ULTRASOUND OF OVARIES  TECHNIQUE: Both transabdominal and transvaginal ultrasound examinations of the pelvis were performed. Transabdominal technique was performed for global imaging of the pelvis including uterus, ovaries, adnexal regions, and pelvic cul-de-sac.  It was necessary to proceed with endovaginal exam following the transabdominal exam to visualize the ovaries. Color and duplex Doppler ultrasound was utilized to evaluate blood flow to the ovaries.  COMPARISON:  12/12/2009  FINDINGS: Uterus  Measurements: 14.0 x 6.9 x 9.2 cm. Numerous fibroids cause a heterogeneous appearance of the uterus. Some index fibroids include a 5.0 x 4.0 x 4.3 cm right fundal fibroid; a 4.5 x 3.1 x 4.1 cm left fundal fibroid cm; and a 1.9 x 1.3 x 1.6 cm submucosal fibroid.  Endometrium  Thickness: 1.5 cm. Submucosal fibroid is noted above.  Right ovary  Measurements: 5.9 x 3.3 x 2.8 cm. There is a collapsed hemorrhagic cyst measuring 2.0 x 1.5 cm in the right ovary along with a complex 2.6 x 3.0 x 2.7 cm septated cystic lesion with tearing internal echogenicity. There is also an adjacent 4.4 x 2.9 x 3.7 cm simple appearing adnexal cyst adjacent to the right ovary. .  Left ovary  Measurements: Measures 3.7 x 2.1 x 1.8 cm and appears normal. Normal appearance/no adnexal mass.  Pulsed Doppler evaluation of both ovaries demonstrates normal low-resistance arterial and venous waveforms.  Trace free pelvic fluid.  IMPRESSION: 1. Uterine fibroids. 2. Complex cystic lesions in the right ovary. One of these has the appearance of  a collapsed hemorrhagic cyst. Another has several septations separating complex appearing fluid. Followup pelvic sonography in 6 weeks time to ensure resolution and exclude neoplastic process is recommended. Both of these are likely benign. 3. Simple appearing adnexal cyst adjacent to the right ovary may be exophytic from the ovary ; adjacent hydrosalpinx is less likely. 4. No findings of ovarian torsion.   Electronically Signed   By: Herbie Baltimore   On: 03/26/2013 20:49   [place] Art/ven Flow Abd Pelv Doppler  03/26/2013   CLINICAL DATA:  Pelvic pain  EXAM: TRANSABDOMINAL AND TRANSVAGINAL ULTRASOUND OF PELVIS  DOPPLER ULTRASOUND OF OVARIES  TECHNIQUE: Both transabdominal and transvaginal ultrasound examinations of the pelvis were performed. Transabdominal technique was performed for global imaging of the pelvis including uterus, ovaries, adnexal regions, and pelvic cul-de-sac.  It was necessary to proceed with endovaginal exam following the transabdominal exam to visualize the ovaries. Color and duplex Doppler ultrasound was utilized to evaluate blood flow to the ovaries.  COMPARISON:  12/12/2009  FINDINGS: Uterus  Measurements: 14.0 x 6.9 x 9.2 cm. Numerous fibroids cause a heterogeneous appearance of the uterus. Some index fibroids include a 5.0 x 4.0 x 4.3 cm right fundal fibroid; a 4.5 x 3.1 x 4.1 cm left fundal fibroid cm; and a 1.9 x 1.3 x 1.6 cm submucosal fibroid.  Endometrium  Thickness: 1.5 cm. Submucosal fibroid is noted above.  Right ovary  Measurements: 5.9 x 3.3 x 2.8 cm. There is a collapsed hemorrhagic cyst measuring 2.0 x 1.5 cm in the right ovary along with a complex 2.6 x 3.0 x 2.7 cm septated cystic lesion with tearing internal echogenicity. There is also an adjacent 4.4 x 2.9 x 3.7 cm simple appearing adnexal cyst adjacent to the right ovary. .  Left ovary  Measurements: Measures 3.7 x 2.1 x 1.8 cm and appears normal. Normal appearance/no adnexal mass.  Pulsed Doppler evaluation of both  ovaries demonstrates normal low-resistance arterial and venous waveforms.  Trace free pelvic  fluid.  IMPRESSION: 1. Uterine fibroids. 2. Complex cystic lesions in the right ovary. One of these has the appearance of a collapsed hemorrhagic cyst. Another has several septations separating complex appearing fluid. Followup pelvic sonography in 6 weeks time to ensure resolution and exclude neoplastic process is recommended. Both of these are likely benign. 3. Simple appearing adnexal cyst adjacent to the right ovary may be exophytic from the ovary ; adjacent hydrosalpinx is less likely. 4. No findings of ovarian torsion.   Electronically Signed   By: Herbie Baltimore   On: 03/26/2013 20:49    MDM   1. Pelvic pain   2. Trichomonas   3. STD (female)   4. Hemorrhagic ovarian cyst   5. Uterine fibroid   6. Abdominal pain, unspecified site      [employer] presents with bilateral lower abdominal and pelvic pain.  Patient reports she's been with the same partner for one year and again his protection. She endorses that she is not at risk for STDs. On pelvic exam patient with green frothy discharge concerning for trach confirmed on wet prep with a few clue cells and WBCs. We'll treat for trichomonas, BV, as well as gonorrhea and Chlamydia prophylactically.    CBC without leukocytosis, patient afebrile in no cervical motion tenderness. Doubt PID. If the test negative, minimal concern for ectopic pregnancy.  UA with large leukocytes, too numerous to count white blood cells and many bacteria however the sample is contaminated and WBCs are likely from patient's vaginal discharge. She was just recently treated for UTI and she denies dysuria or hematuria. Urine culture sent.  Pelvic ultrasound without evidence of fibroids in the uterus.  Right ovary with evidence of hemorrhagic cyst as well as adjacent simple appearing adnexal cyst.  No evidence of torsion.  Radiologist recommends followup in 6 weeks to ensure  resolution of the hemorrhagic cysts.  Discussed these findings with the patient. She reports that her fibroids have given her similar pain in the past but she thought that they had resolved after her myomectomy.  Patient's pain today likely secondary to her hemorrhagic cyst and her fibroids.  Patient alert, oriented, nontoxic, nonseptic appearing. No evidence of dehydration. Patient initially tachycardic but this resolves spontaneously. Pain treated here in the department. Recommend followup with OB/GYN for further evaluation of her pelvic pain, fibroids and hemorrhagic cyst.  It has been determined that no acute conditions requiring further emergency intervention are present at this time. The patient/guardian have been advised of the diagnosis and plan. We have discussed signs and symptoms that warrant return to the ED, such as changes or worsening in symptoms.   Vital signs are stable at discharge.   BP 120/72  Pulse 85  Resp 20  Ht 5\' 7"  (1.702 m)  Wt 220 lb (99.791 kg)  BMI 34.45 kg/m2  SpO2 99%  LMP 02/14/2013  Patient/guardian has voiced understanding and agreed to follow-up with the PCP or specialist.        Dierdre Forth, PA-C 03/26/13 2149

## 2013-03-26 NOTE — ED Notes (Signed)
Pt transported to US

## 2013-03-26 NOTE — ED Notes (Signed)
Pt presents to the ED via POV.  Pt c/o of pelvic pain with n/v.  Pt reports feeling SOB and pain of 9 out of 10.

## 2013-03-27 LAB — URINE CULTURE: Culture: NO GROWTH

## 2013-03-27 LAB — GC/CHLAMYDIA PROBE AMP: GC Probe RNA: NEGATIVE

## 2013-03-27 NOTE — ED Provider Notes (Signed)
Medical screening examination/treatment/procedure(s) were performed by non-physician practitioner and as supervising physician I was immediately available for consultation/collaboration.  Shon Baton, MD 03/27/13 [phone]

## 2013-04-03 ENCOUNTER — Encounter (HOSPITAL_COMMUNITY): Payer: Self-pay | Admitting: Emergency Medicine

## 2013-04-03 ENCOUNTER — Inpatient Hospital Stay (HOSPITAL_COMMUNITY)
Admission: EM | Admit: 2013-04-03 | Discharge: 2013-04-04 | Disposition: A | Discharge status: Home / Self Care | Payer: 59 | Attending: Obstetrics and Gynecology | Admitting: Obstetrics and Gynecology

## 2013-04-03 ENCOUNTER — Emergency Department (HOSPITAL_COMMUNITY): Payer: 59

## 2013-04-03 DIAGNOSIS — R1031 Right lower quadrant pain: Principal | ICD-10-CM | POA: Insufficient documentation

## 2013-04-03 DIAGNOSIS — R112 Nausea with vomiting, unspecified: Secondary | ICD-10-CM | POA: Insufficient documentation

## 2013-04-03 DIAGNOSIS — R109 Unspecified abdominal pain: Secondary | ICD-10-CM

## 2013-04-03 DIAGNOSIS — N83209 Unspecified ovarian cyst, unspecified side: Secondary | ICD-10-CM | POA: Insufficient documentation

## 2013-04-03 DIAGNOSIS — N938 Other specified abnormal uterine and vaginal bleeding: Secondary | ICD-10-CM

## 2013-04-03 DIAGNOSIS — R141 Gas pain: Secondary | ICD-10-CM | POA: Insufficient documentation

## 2013-04-03 DIAGNOSIS — N949 Unspecified condition associated with female genital organs and menstrual cycle: Secondary | ICD-10-CM | POA: Insufficient documentation

## 2013-04-03 DIAGNOSIS — D259 Leiomyoma of uterus, unspecified: Secondary | ICD-10-CM | POA: Insufficient documentation

## 2013-04-03 DIAGNOSIS — R142 Eructation: Secondary | ICD-10-CM | POA: Insufficient documentation

## 2013-04-03 LAB — CBC WITH DIFFERENTIAL/PLATELET
Basophils Relative: 1 % (ref 0–1)
Eosinophils Absolute: 0.1 10*3/uL (ref 0.0–0.7)
Eosinophils Relative: 1 % (ref 0–5)
HCT: 35.9 % — ABNORMAL LOW (ref 36.0–46.0)
Hemoglobin: 12.8 g/dL (ref 12.0–15.0)
Lymphs Abs: 2.4 10*3/uL (ref 0.7–4.0)
MCH: 31.4 pg (ref 26.0–34.0)
MCHC: 35.7 g/dL (ref 30.0–36.0)
MCV: 88.2 fL (ref 78.0–100.0)
Monocytes Absolute: 0.3 10*3/uL (ref 0.1–1.0)
Monocytes Relative: 5 % (ref 3–12)
Platelets: 304 10*3/uL (ref 150–400)
RBC: 4.07 MIL/uL (ref 3.87–5.11)

## 2013-04-03 LAB — URINALYSIS, ROUTINE W REFLEX MICROSCOPIC
Bilirubin Urine: NEGATIVE
Glucose, UA: NEGATIVE mg/dL
Protein, ur: NEGATIVE mg/dL
Urobilinogen, UA: 0.2 mg/dL (ref 0.0–1.0)

## 2013-04-03 LAB — BASIC METABOLIC PANEL
BUN: 7 mg/dL (ref 6–23)
CO2: 23 mEq/L (ref 19–32)
Calcium: 9.3 mg/dL (ref 8.4–10.5)
Creatinine, Ser: 0.59 mg/dL (ref 0.50–1.10)
GFR calc non Af Amer: >90 mL/min (ref >90)
Glucose, Bld: 95 mg/dL (ref 70–99)

## 2013-04-03 LAB — POCT PREGNANCY, URINE: Preg Test, Ur: NEGATIVE

## 2013-04-03 LAB — URINE MICROSCOPIC-ADD ON

## 2013-04-03 MED ORDER — MORPHINE SULFATE 4 MG/ML IJ SOLN
4.0000 mg | Freq: Once | INTRAMUSCULAR | Status: AC
  Administered 2013-04-03: 4 mg via INTRAVENOUS
  Filled 2013-04-03: qty 1

## 2013-04-03 MED ORDER — MORPHINE SULFATE 4 MG/ML IJ SOLN
6.0000 mg | Freq: Once | INTRAMUSCULAR | Status: AC
  Administered 2013-04-03: 6 mg via INTRAVENOUS

## 2013-04-03 MED ORDER — SODIUM CHLORIDE 0.9 % IV BOLUS (SEPSIS)
1000.0000 mL | Freq: Once | INTRAVENOUS | Status: AC
  Administered 2013-04-03: 1000 mL via INTRAVENOUS

## 2013-04-03 MED ORDER — MORPHINE SULFATE 4 MG/ML IJ SOLN
INTRAMUSCULAR | Status: AC
  Filled 2013-04-03: qty 2

## 2013-04-03 MED ORDER — ONDANSETRON HCL 4 MG/2ML IJ SOLN
4.0000 mg | Freq: Once | INTRAMUSCULAR | Status: AC
  Administered 2013-04-03: 4 mg via INTRAVENOUS
  Filled 2013-04-03: qty 2

## 2013-04-03 NOTE — ED Provider Notes (Signed)
CSN: [id number]     Arrival date & time 04/03/13  1827 History   First MD Initiated Contact with Patient 04/03/13 2124     Chief Complaint  Patient presents with  . Pelvic Pain   (Consider location/radiation/quality/duration/timing/severity/associated sxs/prior Treatment) HPI  [name] is a 27 y.o. female with PMH significant for Asthma and ovarian cysts requiring ex lap. Pt is reporting severe bilateral lower abd pain worsening over the last 48 hours associated with 2x episodes of NBNB emesis, and abd distention. Denies fever, chills, change in bowel or bladder habits, abnormal vaginal discharge. Pt also began menstruating today, LMP was Aug. 14th, normally she has regular periods.   Pt was seen for pelvic pain last week: Dx with hemorraghic right ovarian cyst, uterine fibroids,  trich and treated prophylactically for cervicitis. She followed at [facility] with NP Corder.    Past Medical History  Diagnosis Date  . Allergy     seasonal allergies  . Pelvic cyst     Seen by gynecologist Dr. Tenny Craw. Scheduled for ex lap on 12/02/11  . Pneumonia   . Depression   . Fibroids   . Asthma    Past Surgical History  Procedure Laterality Date  . No past surgeries    . Myomectomy  12/02/2011    Procedure: MYOMECTOMY;  Surgeon: Miguel Aschoff, MD;  Location: [facility];  Service: Gynecology;  Laterality: N/A;   No family history on file. History  Substance Use Topics  . Smoking status: Never Smoker   . Smokeless tobacco: Not on file  . Alcohol Use: No   OB History   Grav Para Term Preterm Abortions TAB SAB Ect Mult Living                 Review of Systems 10 systems reviewed and found to be negative, except as noted in the HPI   Allergies  Dilaudid; Clindamycin/lincomycin; and Penicillins  Home Medications   Current Outpatient Rx  Name  Route  Sig  Dispense  Refill  . albuterol (PROVENTIL HFA;VENTOLIN HFA) 108 (90 BASE) MCG/ACT inhaler   Inhalation   Inhale 2  puffs into the lungs every 4 (four) hours as needed for wheezing or shortness of breath.         Marland Kitchen HYDROcodone-acetaminophen (NORCO/VICODIN) 5-325 MG per tablet   Oral   Take 1 tablet by mouth every 4 (four) hours as needed for pain.   11 tablet   0   . Multiple Vitamin (MULTIVITAMIN WITH MINERALS) TABS tablet   Oral   Take 1 tablet by mouth daily.         . ondansetron (ZOFRAN) 8 MG tablet   Oral   Take 1 tablet (8 mg total) by mouth every 4 (four) hours as needed for nausea.   4 tablet   0   . propranolol ER (INDERAL LA) 80 MG 24 hr capsule   Oral   Take 1 capsule (80 mg total) by mouth daily.   30 capsule   4   . SUMAtriptan (IMITREX) 50 MG tablet   Oral   Take 1 tablet (50 mg total) by mouth every 2 (two) hours as needed for migraine.   10 tablet   0   . traZODone (DESYREL) 50 MG tablet   Oral   Take 0.5-1 tablets (25-50 mg total) by mouth at bedtime as needed for sleep.   30 tablet   1   . venlafaxine (EFFEXOR) 75 MG tablet  Oral   Take 1 tablet (75 mg total) by mouth 2 (two) times daily.   60 tablet   2    BP 142/85  Pulse 90  Temp(Src) 98.8 F (37.1 C) (Oral)  Resp 16  SpO2 94%  LMP 02/14/2013 Physical Exam  Nursing note and vitals reviewed. Constitutional: She is oriented to person, place, and time. She appears well-developed and well-nourished.  Agitated and crying  HENT:  Head: Normocephalic.  Mouth/Throat: Oropharynx is clear and moist.  Eyes: Conjunctivae and EOM are normal. Pupils are equal, round, and reactive to light.  Neck: Normal range of motion.  Cardiovascular: Normal rate, regular rhythm and intact distal pulses.   Pulmonary/Chest: Effort normal and breath sounds normal. No stridor. No respiratory distress. She has no wheezes. She has no rales. She exhibits no tenderness.  Abdominal: Soft. Bowel sounds are normal. She exhibits distension. She exhibits no mass. There is tenderness. There is no rebound and no guarding.  Well healed  lower midline surgical scar. + distention, decreased bowel sounds, diffusely TTP especially in the bilateral lower quadrants, no guarding or rebound.    Musculoskeletal: Normal range of motion.  Neurological: She is alert and oriented to person, place, and time.  Psychiatric: She has a normal mood and affect.    ED Course  Procedures (including critical care time) Labs Review Labs Reviewed  CBC WITH DIFFERENTIAL - Abnormal; Notable for the following:    HCT 35.9 (*)    All other components within normal limits  BASIC METABOLIC PANEL - Abnormal; Notable for the following:    Sodium 134 (*)    All other components within normal limits  URINALYSIS, ROUTINE W REFLEX MICROSCOPIC - Abnormal; Notable for the following:    APPearance CLOUDY (*)    Hgb urine dipstick LARGE (*)    Leukocytes, UA SMALL (*)    All other components within normal limits  URINE MICROSCOPIC-ADD ON  POCT PREGNANCY, URINE   Imaging Review US Transvaginal Non-ob  04/03/2013   CLINICAL DATA:  Pelvic pain.  EXAM: TRANSABDOMINAL AND TRANSVAGINAL ULTRASOUND OF PELVIS  DOPPLER ULTRASOUND OF OVARIES  TECHNIQUE: Both transabdominal and transvaginal ultrasound examinations of the pelvis were performed. Transabdominal technique was performed for global imaging of the pelvis including uterus, ovaries, adnexal regions, and pelvic cul-de-sac.  It was necessary to proceed with endovaginal exam following the transabdominal exam to visualize the . Color and duplex Doppler ultrasound was utilized to evaluate blood flow to the ovaries.  COMPARISON:  None.  Multiple prior pelvic ultrasounds.  FINDINGS: Uterus  Measurements: 4.2 x 6.7 x 9.5 cm. Numerous uterine fibroids.  Endometrium  Thickness: 7.8 mm.  No focal abnormality visualized.  Right ovary  Measurements: 6.2 x 3.8 x 4.1 cm. There is a 4.6 x 2.8 x 3.6 cm cyst.  Left ovary  Measurements: 2.6 x 1.8 x 1.6 cm. Normal appearance/no adnexal mass.  Pulsed Doppler evaluation of both ovaries  demonstrates normal low-resistance arterial and venous waveforms.  Other findings  Trace free pelvic fluid.  IMPRESSION: Numerous uterine fibroids.  Normal endometrial thickness.  4.6 cm right ovarian cyst.  No sonographic evidence for ovarian torsion.   Electronically Signed   By: Loralie Champagne M.D.   On: 04/03/2013 23:42   US Pelvis Complete  04/03/2013   CLINICAL DATA:  Pelvic pain.  EXAM: TRANSABDOMINAL AND TRANSVAGINAL ULTRASOUND OF PELVIS  DOPPLER ULTRASOUND OF OVARIES  TECHNIQUE: Both transabdominal and transvaginal ultrasound examinations of the pelvis were performed. Transabdominal technique was performed  for global imaging of the pelvis including uterus, ovaries, adnexal regions, and pelvic cul-de-sac.  It was necessary to proceed with endovaginal exam following the transabdominal exam to visualize the . Color and duplex Doppler ultrasound was utilized to evaluate blood flow to the ovaries.  COMPARISON:  None.  Multiple prior pelvic ultrasounds.  FINDINGS: Uterus  Measurements: 4.2 x 6.7 x 9.5 cm. Numerous uterine fibroids.  Endometrium  Thickness: 7.8 mm.  No focal abnormality visualized.  Right ovary  Measurements: 6.2 x 3.8 x 4.1 cm. There is a 4.6 x 2.8 x 3.6 cm cyst.  Left ovary  Measurements: 2.6 x 1.8 x 1.6 cm. Normal appearance/no adnexal mass.  Pulsed Doppler evaluation of both ovaries demonstrates normal low-resistance arterial and venous waveforms.  Other findings  Trace free pelvic fluid.  IMPRESSION: Numerous uterine fibroids.  Normal endometrial thickness.  4.6 cm right ovarian cyst.  No sonographic evidence for ovarian torsion.   Electronically Signed   By: Loralie Champagne M.D.   On: 04/03/2013 23:42   [place] Art/ven Flow Abd Pelv Doppler  04/03/2013   CLINICAL DATA:  Pelvic pain.  EXAM: TRANSABDOMINAL AND TRANSVAGINAL ULTRASOUND OF PELVIS  DOPPLER ULTRASOUND OF OVARIES  TECHNIQUE: Both transabdominal and transvaginal ultrasound examinations of the pelvis were performed. Transabdominal  technique was performed for global imaging of the pelvis including uterus, ovaries, adnexal regions, and pelvic cul-de-sac.  It was necessary to proceed with endovaginal exam following the transabdominal exam to visualize the . Color and duplex Doppler ultrasound was utilized to evaluate blood flow to the ovaries.  COMPARISON:  None.  Multiple prior pelvic ultrasounds.  FINDINGS: Uterus  Measurements: 4.2 x 6.7 x 9.5 cm. Numerous uterine fibroids.  Endometrium  Thickness: 7.8 mm.  No focal abnormality visualized.  Right ovary  Measurements: 6.2 x 3.8 x 4.1 cm. There is a 4.6 x 2.8 x 3.6 cm cyst.  Left ovary  Measurements: 2.6 x 1.8 x 1.6 cm. Normal appearance/no adnexal mass.  Pulsed Doppler evaluation of both ovaries demonstrates normal low-resistance arterial and venous waveforms.  Other findings  Trace free pelvic fluid.  IMPRESSION: Numerous uterine fibroids.  Normal endometrial thickness.  4.6 cm right ovarian cyst.  No sonographic evidence for ovarian torsion.   Electronically Signed   By: Loralie Champagne M.D.   On: 04/03/2013 23:42    MDM  No diagnosis found.  Filed Vitals:   04/03/13 1837 04/03/13 2133  BP: 136/88 142/85  Pulse: 109 90  Temp: 99.6 F (37.6 C) 98.8 F (37.1 C)  TempSrc: Oral Oral  Resp: 16   SpO2: 100% 94%     [name] is a 27 y.o. female with severe bilateral lower abdominal pain, difficult to cotrol with toradol and multiple doses of Morphine (Pt is allergic to dilauded). Abd exam is non surgical. Bloodwork is unremarkable. UA shows large hemoglobin because Pt is menstruating. Pelvic CT shows no signs of torsion, trace free fluid, and a 4.6 cm right ovarian cyst.   Consult from her private [facility] [phone]) Dr. Jackelyn Knife appreciated: he feels her pain is not explained by this size of ovarian cyst and reccommends CT to further evaluate.   Case signed out to [facility] at shift change. Plan is to follow up CT and pain control. If pain  is reasonably controlled and CT is negative, Pt can be dc'd. If CT is negative and pain remains severe, plan is to re consult [facility].  Medications  morphine 4 MG/ML injection (not administered)  ketorolac (TORADOL) 30 MG/ML injection 30 mg (not administered)  morphine 4 MG/ML injection 4 mg (not administered)  morphine 4 MG/ML injection 4 mg (4 mg Intravenous Given 04/03/13 2258)  ondansetron (ZOFRAN) injection 4 mg (4 mg Intravenous Given 04/03/13 2257)  sodium chloride 0.9 % bolus 1,000 mL (1,000 mLs Intravenous New Bag/Given 04/03/13 2256)  morphine 4 MG/ML injection 6 mg (6 mg Intravenous Given 04/03/13 2338)    Note: Portions of this report may have been transcribed using voice recognition software. Every effort was made to ensure accuracy; however, inadvertent computerized transcription errors may be present    Wynetta Emery, PA-C 04/04/13 0109

## 2013-04-03 NOTE — ED Notes (Signed)
Pt states that she was here last week for pelvic pain.  Was dx w/ fibroids.  States that the pain is much worse and she is SOB from the pain.  States that she missed her period last month.

## 2013-04-03 NOTE — ED Notes (Signed)
US at bedside

## 2013-04-03 NOTE — ED Notes (Signed)
Patient complained of unrelieved pain and increased vaginal bleeding. Attending made aware, new order given.

## 2013-04-04 ENCOUNTER — Emergency Department (HOSPITAL_COMMUNITY): Payer: 59

## 2013-04-04 ENCOUNTER — Encounter (HOSPITAL_COMMUNITY): Payer: Self-pay

## 2013-04-04 MED ORDER — DIPHENHYDRAMINE HCL 50 MG/ML IJ SOLN
25.0000 mg | Freq: Once | INTRAMUSCULAR | Status: AC
  Administered 2013-04-04: 25 mg via INTRAVENOUS
  Filled 2013-04-04: qty 1

## 2013-04-04 MED ORDER — IOHEXOL 300 MG/ML  SOLN
100.0000 mL | Freq: Once | INTRAMUSCULAR | Status: AC | PRN
  Administered 2013-04-04: 100 mL via INTRAVENOUS

## 2013-04-04 MED ORDER — PROMETHAZINE HCL 25 MG/ML IJ SOLN
12.5000 mg | Freq: Once | INTRAMUSCULAR | Status: AC
  Administered 2013-04-04: 12.5 mg via INTRAVENOUS
  Filled 2013-04-04: qty 1

## 2013-04-04 MED ORDER — AMITRIPTYLINE HCL 25 MG PO TABS: 25.0000 mg | ORAL_TABLET | Freq: Every day | ORAL | Status: DC

## 2013-04-04 MED ORDER — NAPROXEN 500 MG PO TABS: 500.0000 mg | ORAL_TABLET | Freq: Two times a day (BID) | ORAL | Status: DC

## 2013-04-04 MED ORDER — LACTATED RINGERS IV BOLUS (SEPSIS)
1000.0000 mL | Freq: Once | INTRAVENOUS | Status: AC
  Administered 2013-04-04: 1000 mL via INTRAVENOUS

## 2013-04-04 MED ORDER — IOHEXOL 300 MG/ML  SOLN
50.0000 mL | Freq: Once | INTRAMUSCULAR | Status: AC | PRN
  Administered 2013-04-04: 50 mL via ORAL

## 2013-04-04 MED ORDER — MORPHINE SULFATE 4 MG/ML IJ SOLN
6.0000 mg | Freq: Once | INTRAMUSCULAR | Status: AC
  Administered 2013-04-04: 6 mg via INTRAVENOUS
  Filled 2013-04-04: qty 2

## 2013-04-04 MED ORDER — OXYCODONE-ACETAMINOPHEN 5-325 MG PO TABS
2.0000 | ORAL_TABLET | Freq: Once | ORAL | Status: AC
  Administered 2013-04-04: 2 via ORAL
  Filled 2013-04-04: qty 2

## 2013-04-04 MED ORDER — HYDROMORPHONE HCL PF 1 MG/ML IJ SOLN
2.0000 mg | Freq: Once | INTRAMUSCULAR | Status: AC
  Administered 2013-04-04: 2 mg via INTRAVENOUS
  Filled 2013-04-04: qty 2

## 2013-04-04 MED ORDER — OXYCODONE-ACETAMINOPHEN 5-325 MG PO TABS: 2.0000 | ORAL_TABLET | ORAL | Status: DC | PRN

## 2013-04-04 MED ORDER — KETOROLAC TROMETHAMINE 30 MG/ML IJ SOLN
30.0000 mg | Freq: Once | INTRAMUSCULAR | Status: AC
  Administered 2013-04-04: 30 mg via INTRAVENOUS
  Filled 2013-04-04: qty 1

## 2013-04-04 MED ORDER — MORPHINE SULFATE 4 MG/ML IJ SOLN
4.0000 mg | Freq: Once | INTRAMUSCULAR | Status: AC
  Administered 2013-04-04: 4 mg via INTRAVENOUS
  Filled 2013-04-04: qty 1

## 2013-04-04 MED ORDER — DIPHENHYDRAMINE HCL 50 MG/ML IJ SOLN
12.5000 mg | Freq: Once | INTRAMUSCULAR | Status: AC
  Administered 2013-04-04: 12.5 mg via INTRAVENOUS
  Filled 2013-04-04: qty 1

## 2013-04-04 MED ORDER — AMITRIPTYLINE HCL 25 MG PO TABS
25.0000 mg | ORAL_TABLET | Freq: Once | ORAL | Status: AC
Start: 2013-04-04 — End: 2013-04-04
  Administered 2013-04-04: 25 mg via ORAL
  Filled 2013-04-04: qty 1

## 2013-04-04 NOTE — MAU Note (Signed)
Patient arrived by [facility] from [facility] for further evaluation of abdominal pain and bleeding. Patient states she has a history of fibroids.

## 2013-04-04 NOTE — ED Notes (Signed)
[facility] called for transport. 

## 2013-04-04 NOTE — MAU Provider Note (Signed)
History     CSN: [id number]  Arrival date and time: 04/03/13 1827   First Provider Initiated Contact with Patient 04/04/13 [phone]      Chief Complaint  Patient presents with  . Pelvic Pain  . Vaginal Bleeding   Pelvic Pain The patient's primary symptoms include pelvic pain. Associated symptoms include abdominal pain, frequency, nausea and vomiting. Pertinent negatives include no chills, constipation, diarrhea, dysuria or fever.  Vaginal Bleeding The patient's primary symptoms include pelvic pain. Associated symptoms include abdominal pain, frequency, nausea and vomiting. Pertinent negatives include no chills, constipation, diarrhea, dysuria or fever.    Pt is not pregnant and is transferred from [facility] ED after being seen there with pelvic pain and bleeding. Pt had CT and pelvic ultrasound that showed numerous uterine fibroids, normal endometrial thickness and 4.6cm right ovarian cyst. No sonographic evidence of uterine torsion.  Pt has hx of myomectomy 11/14/2011 by Dr. Miguel Aschoff.  Pt was seen at [facility] last  ?year by NP.  Pt has been seen several times in the ED for pelvic pain 02/14/2013 and 03/26/2013 and sent home with pain meds. Pt thought she had a UTI last week and went to her PCP who said her urine was fine.  Pt is stable on Effexor for depression and headaches are stable with suppressive therapy. Pt states that her pain increased about 2 days ago and had heavy bleeding yesterday.  Pt had associated nausea and vomiting.  Pt denies constipation or diarrhea.  Pt last had IC 1 month ago without dyspareunia. Pt has had Morphine 6mg  x2, Morphine 4mg  x1 and Toadol 30mg  IV while in the  ED at [facility].  Dr. Jackelyn Knife was consulted and agreed to accept the patient at [facility].    Past Medical History  Diagnosis Date  . Allergy     seasonal allergies  . Pelvic cyst     Seen by gynecologist Dr. Tenny Craw. Scheduled for ex lap on 12/02/11  . Pneumonia   . Depression   . Fibroids    . Asthma     Past Surgical History  Procedure Laterality Date  . No past surgeries    . Myomectomy  12/02/2011    Procedure: MYOMECTOMY;  Surgeon: Miguel Aschoff, MD;  Location: [facility] OR[facility];  Service: Gynecology;  Laterality: N/A;    Family History  Problem Relation Age of Onset  . Heart disease Mother     History  Substance Use Topics  . Smoking status: Never Smoker   . Smokeless tobacco: Not on file  . Alcohol Use: No    Allergies:  Allergies  Allergen Reactions  . Dilaudid [Hydromorphone Hcl] Itching and Swelling  . Clindamycin/Lincomycin   . Penicillins Other (See Comments)    Unknown - childhood reaction    Prescriptions prior to admission  Medication Sig Dispense Refill  . albuterol (PROVENTIL HFA;VENTOLIN HFA) 108 (90 BASE) MCG/ACT inhaler Inhale 2 puffs into the lungs every 4 (four) hours as needed for wheezing or shortness of breath.      Marland Kitchen HYDROcodone-acetaminophen (NORCO/VICODIN) 5-325 MG per tablet Take 1 tablet by mouth every 4 (four) hours as needed for pain.  11 tablet  0  . Multiple Vitamin (MULTIVITAMIN WITH MINERALS) TABS tablet Take 1 tablet by mouth daily.      . ondansetron (ZOFRAN) 8 MG tablet Take 1 tablet (8 mg total) by mouth every 4 (four) hours as needed for nausea.  4 tablet  0  . propranolol ER (INDERAL LA) 80  MG 24 hr capsule Take 1 capsule (80 mg total) by mouth daily.  30 capsule  4  . SUMAtriptan (IMITREX) 50 MG tablet Take 1 tablet (50 mg total) by mouth every 2 (two) hours as needed for migraine.  10 tablet  0  . traZODone (DESYREL) 50 MG tablet Take 0.5-1 tablets (25-50 mg total) by mouth at bedtime as needed for sleep.  30 tablet  1  . venlafaxine (EFFEXOR) 75 MG tablet Take 1 tablet (75 mg total) by mouth 2 (two) times daily.  60 tablet  2    Review of Systems  Constitutional: Negative for fever and chills.  Gastrointestinal: Positive for nausea, vomiting and abdominal pain. Negative for diarrhea and constipation.  Genitourinary: Positive  for frequency, vaginal bleeding and pelvic pain. Negative for dysuria.   Physical Exam   Blood pressure 125/68, pulse 79, temperature 98.2 F (36.8 C), temperature source Oral, resp. rate 16, last menstrual period 02/14/2013, SpO2 100.00%.  Physical Exam  Nursing note and vitals reviewed. Constitutional: She is oriented to person, place, and time. She appears well-developed and well-nourished. She appears distressed.  Pt curled up on side rocking and moaning  HENT:  Head: Normocephalic.  Eyes: Pupils are equal, round, and reactive to light.  Neck: Normal range of motion. Neck supple.  Cardiovascular: Normal rate.   Respiratory: Effort normal.  GI: There is tenderness. There is guarding.  Musculoskeletal: Normal range of motion.  Neurological: She is alert and oriented to person, place, and time.  Skin: Skin is warm and dry.    MAU Course  Procedures Discussed with Dr. Ellyn Hack pt's clinical status and diagnostic findings Will try Dilaudid 2mg  and phenergan Discussed with pt- pt's chart says that she has itching and swelling with Dilaudid; pt states she only had itching no swelling with dilaudid Discussed with pharmacy- will try Dilaudid 2mg  and Benadryl 12.5mg  and phenergan 12.5mg  and observe pt Results for orders placed during the hospital encounter of 04/03/13 (from the past 24 hour(s))  URINALYSIS, ROUTINE W REFLEX MICROSCOPIC     Status: Abnormal   Collection Time    04/03/13  7:10 PM      Result Value Range   Color, Urine YELLOW  YELLOW   APPearance CLOUDY (*) CLEAR   Specific Gravity, Urine 1.025  1.005 - 1.030   pH 7.0  5.0 - 8.0   Glucose, UA NEGATIVE  NEGATIVE mg/dL   Hgb urine dipstick LARGE (*) NEGATIVE   Bilirubin Urine NEGATIVE  NEGATIVE   Ketones, ur NEGATIVE  NEGATIVE mg/dL   Protein, ur NEGATIVE  NEGATIVE mg/dL   Urobilinogen, UA 0.2  0.0 - 1.0 mg/dL   Nitrite NEGATIVE  NEGATIVE   Leukocytes, UA SMALL (*) NEGATIVE  URINE MICROSCOPIC-ADD ON     Status: None    Collection Time    04/03/13  7:10 PM      Result Value Range   Squamous Epithelial / LPF RARE  RARE   WBC, UA 3-6  <3 WBC/hpf   RBC / HPF TOO NUMEROUS TO COUNT  <3 RBC/hpf  CBC WITH DIFFERENTIAL     Status: Abnormal   Collection Time    04/03/13  7:15 PM      Result Value Range   WBC 6.3  4.0 - 10.5 K/uL   RBC 4.07  3.87 - 5.11 MIL/uL   Hemoglobin 12.8  12.0 - 15.0 g/dL   HCT 09.8 (*) 11.9 - 14.7 %   MCV 88.2  78.0 - 100.0 fL  MCH 31.4  26.0 - 34.0 pg   MCHC 35.7  30.0 - 36.0 g/dL   RDW 40.9  81.1 - 91.4 %   Platelets 304  150 - 400 K/uL   Neutrophils Relative % 55  43 - 77 %   Neutro Abs 3.4  1.7 - 7.7 K/uL   Lymphocytes Relative 38  12 - 46 %   Lymphs Abs 2.4  0.7 - 4.0 K/uL   Monocytes Relative 5  3 - 12 %   Monocytes Absolute 0.3  0.1 - 1.0 K/uL   Eosinophils Relative 1  0 - 5 %   Eosinophils Absolute 0.1  0.0 - 0.7 K/uL   Basophils Relative 1  0 - 1 %   Basophils Absolute 0.0  0.0 - 0.1 K/uL  BASIC METABOLIC PANEL     Status: Abnormal   Collection Time    04/03/13  7:15 PM      Result Value Range   Sodium 134 (*) 135 - 145 mEq/L   Potassium 3.8  3.5 - 5.1 mEq/L   Chloride 101  96 - 112 mEq/L   CO2 23  19 - 32 mEq/L   Glucose, Bld 95  70 - 99 mg/dL   BUN 7  6 - 23 mg/dL   Creatinine, Ser 7.82  0.50 - 1.10 mg/dL   Calcium 9.3  8.4 - 95.6 mg/dL   GFR calc non Af Amer >90  >90 mL/min   GFR calc Af Amer >90  >90 mL/min  POCT PREGNANCY, URINE     Status: None   Collection Time    04/03/13  7:25 PM      Result Value Range   Preg Test, Ur NEGATIVE  NEGATIVE  US Transvaginal Non-ob  04/03/2013   CLINICAL DATA:  Pelvic pain.  EXAM: TRANSABDOMINAL AND TRANSVAGINAL ULTRASOUND OF PELVIS  DOPPLER ULTRASOUND OF OVARIES  TECHNIQUE: Both transabdominal and transvaginal ultrasound examinations of the pelvis were performed. Transabdominal technique was performed for global imaging of the pelvis including uterus, ovaries, adnexal regions, and pelvic cul-de-sac.  It was necessary  to proceed with endovaginal exam following the transabdominal exam to visualize the . Color and duplex Doppler ultrasound was utilized to evaluate blood flow to the ovaries.  COMPARISON:  None.  Multiple prior pelvic ultrasounds.  FINDINGS: Uterus  Measurements: 4.2 x 6.7 x 9.5 cm. Numerous uterine fibroids.  Endometrium  Thickness: 7.8 mm.  No focal abnormality visualized.  Right ovary  Measurements: 6.2 x 3.8 x 4.1 cm. There is a 4.6 x 2.8 x 3.6 cm cyst.  Left ovary  Measurements: 2.6 x 1.8 x 1.6 cm. Normal appearance/no adnexal mass.  Pulsed Doppler evaluation of both ovaries demonstrates normal low-resistance arterial and venous waveforms.  Other findings  Trace free pelvic fluid.  IMPRESSION: Numerous uterine fibroids.  Normal endometrial thickness.  4.6 cm right ovarian cyst.  No sonographic evidence for ovarian torsion.   Electronically Signed   By: Loralie Champagne M.D.   On: 04/03/2013 23:42   US Pelvis Complete  04/03/2013   CLINICAL DATA:  Pelvic pain.  EXAM: TRANSABDOMINAL AND TRANSVAGINAL ULTRASOUND OF PELVIS  DOPPLER ULTRASOUND OF OVARIES  TECHNIQUE: Both transabdominal and transvaginal ultrasound examinations of the pelvis were performed. Transabdominal technique was performed for global imaging of the pelvis including uterus, ovaries, adnexal regions, and pelvic cul-de-sac.  It was necessary to proceed with endovaginal exam following the transabdominal exam to visualize the . Color and duplex Doppler ultrasound was utilized to evaluate blood  flow to the ovaries.  COMPARISON:  None.  Multiple prior pelvic ultrasounds.  FINDINGS: Uterus  Measurements: 4.2 x 6.7 x 9.5 cm. Numerous uterine fibroids.  Endometrium  Thickness: 7.8 mm.  No focal abnormality visualized.  Right ovary  Measurements: 6.2 x 3.8 x 4.1 cm. There is a 4.6 x 2.8 x 3.6 cm cyst.  Left ovary  Measurements: 2.6 x 1.8 x 1.6 cm. Normal appearance/no adnexal mass.  Pulsed Doppler evaluation of both ovaries demonstrates normal  low-resistance arterial and venous waveforms.  Other findings  Trace free pelvic fluid.  IMPRESSION: Numerous uterine fibroids.  Normal endometrial thickness.  4.6 cm right ovarian cyst.  No sonographic evidence for ovarian torsion.   Electronically Signed   By: Loralie Champagne M.D.   On: 04/03/2013 23:42   Ct Abdomen Pelvis W Contrast  04/04/2013   CLINICAL DATA:  Pelvic pain. Nausea, vomiting, abdominal distention.  EXAM: CT ABDOMEN AND PELVIS WITH CONTRAST  TECHNIQUE: Multidetector CT imaging of the abdomen and pelvis was performed using the standard protocol following bolus administration of intravenous contrast.  CONTRAST:  OMNIPAQUE IOHEXOL 300 MG/ML  SOLN  COMPARISON:  None.  FINDINGS: Lung bases are clear. No effusions. Heart is normal size.  Liver, gallbladder, spleen, pancreas, adrenals and kidneys are normal. Appendix is visualized and is normal. Large and small bowel are unremarkable.  Numerous high density, likely enhancing mass is within the uterus compatible with numerous fibroids. There is an exophytic fibroid in the right adnexal region measuring up to 4.7 cm. 4.3 cm cystic area in the right posterior adnexa, likely right ovarian cyst. Trace free fluid in the pelvis. No free air or adenopathy. Aorta is normal caliber.  No acute bony abnormality.  IMPRESSION: Numerous enhancing uterine fibroids.  4.3 cm right ovarian cyst.   Electronically Signed   By: Charlett Nose M.D.   On: 04/04/2013 04:38   [place] Art/ven Flow Abd Pelv Doppler  04/03/2013   CLINICAL DATA:  Pelvic pain.  EXAM: TRANSABDOMINAL AND TRANSVAGINAL ULTRASOUND OF PELVIS  DOPPLER ULTRASOUND OF OVARIES  TECHNIQUE: Both transabdominal and transvaginal ultrasound examinations of the pelvis were performed. Transabdominal technique was performed for global imaging of the pelvis including uterus, ovaries, adnexal regions, and pelvic cul-de-sac.  It was necessary to proceed with endovaginal exam following the transabdominal exam to  visualize the . Color and duplex Doppler ultrasound was utilized to evaluate blood flow to the ovaries.  COMPARISON:  None.  Multiple prior pelvic ultrasounds.  FINDINGS: Uterus  Measurements: 4.2 x 6.7 x 9.5 cm. Numerous uterine fibroids.  Endometrium  Thickness: 7.8 mm.  No focal abnormality visualized.  Right ovary  Measurements: 6.2 x 3.8 x 4.1 cm. There is a 4.6 x 2.8 x 3.6 cm cyst.  Left ovary  Measurements: 2.6 x 1.8 x 1.6 cm. Normal appearance/no adnexal mass.  Pulsed Doppler evaluation of both ovaries demonstrates normal low-resistance arterial and venous waveforms.  Other findings  Trace free pelvic fluid.  IMPRESSION: Numerous uterine fibroids.  Normal endometrial thickness.  4.6 cm right ovarian cyst.  No sonographic evidence for ovarian torsion.   Electronically Signed   By: Loralie Champagne M.D.   On: 04/03/2013 23:42  Dr. Ellyn Hack here to see pt Pt given Toradol IV,Percocet and Amitriptyline with pain level down to level 6  Pt willing to go home Assessment and Plan  Abdominal pain- Rx for Percocet, Amitriptyline and Naproxene 500mg  Fibroids- pain management Pt to f/u in office in ~1 week  Jailani Hogans 04/04/2013,  8:34 AM

## 2013-04-04 NOTE — ED Provider Notes (Signed)
Medical screening examination/treatment/procedure(s) were performed by non-physician practitioner and as supervising physician I was immediately available for consultation/collaboration.  Garielle Mroz, MD 04/04/13 0551 

## 2013-04-04 NOTE — ED Provider Notes (Signed)
Medical screening examination/treatment/procedure(s) were performed by non-physician practitioner and as supervising physician I was immediately available for consultation/collaboration.  Sunnie Nielsen, MD 04/04/13 [id number]

## 2013-04-04 NOTE — ED Provider Notes (Signed)
Patient care assumed from [facility], PA-C at shift change with CT pending.  CT scan significant only for a 4.3 cm right ovarian cyst and numerous uterine fibroids. No other acute intra-abdominal processes identified. Have consulted Dr. Jackelyn Knife regarding CT results who has agreed to admit the patient to MAU at [facility] for pain control as patient's pain not well controlled with 6mg  morphine x 2, 4mg  morphine x 1, and 30mg  IV toradol. Temp admission orders and EMTALA completed. Patient hemodynamically stable and appropriate for transfer.   Results for orders placed during the hospital encounter of 04/03/13  CBC WITH DIFFERENTIAL      Result Value Range   WBC 6.3  4.0 - 10.5 K/uL   RBC 4.07  3.87 - 5.11 MIL/uL   Hemoglobin 12.8  12.0 - 15.0 g/dL   HCT 16.1 (*) 09.6 - 04.5 %   MCV 88.2  78.0 - 100.0 fL   MCH 31.4  26.0 - 34.0 pg   MCHC 35.7  30.0 - 36.0 g/dL   RDW 40.9  81.1 - 91.4 %   Platelets 304  150 - 400 K/uL   Neutrophils Relative % 55  43 - 77 %   Neutro Abs 3.4  1.7 - 7.7 K/uL   Lymphocytes Relative 38  12 - 46 %   Lymphs Abs 2.4  0.7 - 4.0 K/uL   Monocytes Relative 5  3 - 12 %   Monocytes Absolute 0.3  0.1 - 1.0 K/uL   Eosinophils Relative 1  0 - 5 %   Eosinophils Absolute 0.1  0.0 - 0.7 K/uL   Basophils Relative 1  0 - 1 %   Basophils Absolute 0.0  0.0 - 0.1 K/uL  BASIC METABOLIC PANEL      Result Value Range   Sodium 134 (*) 135 - 145 mEq/L   Potassium 3.8  3.5 - 5.1 mEq/L   Chloride 101  96 - 112 mEq/L   CO2 23  19 - 32 mEq/L   Glucose, Bld 95  70 - 99 mg/dL   BUN 7  6 - 23 mg/dL   Creatinine, Ser 7.82  0.50 - 1.10 mg/dL   Calcium 9.3  8.4 - 95.6 mg/dL   GFR calc non Af Amer >90  >90 mL/min   GFR calc Af Amer >90  >90 mL/min  URINALYSIS, ROUTINE W REFLEX MICROSCOPIC      Result Value Range   Color, Urine YELLOW  YELLOW   APPearance CLOUDY (*) CLEAR   Specific Gravity, Urine 1.025  1.005 - 1.030   pH 7.0  5.0 - 8.0   Glucose, UA NEGATIVE  NEGATIVE  mg/dL   Hgb urine dipstick LARGE (*) NEGATIVE   Bilirubin Urine NEGATIVE  NEGATIVE   Ketones, ur NEGATIVE  NEGATIVE mg/dL   Protein, ur NEGATIVE  NEGATIVE mg/dL   Urobilinogen, UA 0.2  0.0 - 1.0 mg/dL   Nitrite NEGATIVE  NEGATIVE   Leukocytes, UA SMALL (*) NEGATIVE  URINE MICROSCOPIC-ADD ON      Result Value Range   Squamous Epithelial / LPF RARE  RARE   WBC, UA 3-6  <3 WBC/hpf   RBC / HPF TOO NUMEROUS TO COUNT  <3 RBC/hpf  POCT PREGNANCY, URINE      Result Value Range   Preg Test, Ur NEGATIVE  NEGATIVE   US Transvaginal Non-ob  04/03/2013   CLINICAL DATA:  Pelvic pain.  EXAM: TRANSABDOMINAL AND TRANSVAGINAL ULTRASOUND OF PELVIS  DOPPLER ULTRASOUND OF OVARIES  TECHNIQUE:  Both transabdominal and transvaginal ultrasound examinations of the pelvis were performed. Transabdominal technique was performed for global imaging of the pelvis including uterus, ovaries, adnexal regions, and pelvic cul-de-sac.  It was necessary to proceed with endovaginal exam following the transabdominal exam to visualize the . Color and duplex Doppler ultrasound was utilized to evaluate blood flow to the ovaries.  COMPARISON:  None.  Multiple prior pelvic ultrasounds.  FINDINGS: Uterus  Measurements: 4.2 x 6.7 x 9.5 cm. Numerous uterine fibroids.  Endometrium  Thickness: 7.8 mm.  No focal abnormality visualized.  Right ovary  Measurements: 6.2 x 3.8 x 4.1 cm. There is a 4.6 x 2.8 x 3.6 cm cyst.  Left ovary  Measurements: 2.6 x 1.8 x 1.6 cm. Normal appearance/no adnexal mass.  Pulsed Doppler evaluation of both ovaries demonstrates normal low-resistance arterial and venous waveforms.  Other findings  Trace free pelvic fluid.  IMPRESSION: Numerous uterine fibroids.  Normal endometrial thickness.  4.6 cm right ovarian cyst.  No sonographic evidence for ovarian torsion.   Electronically Signed   By: Loralie Champagne M.D.   On: 04/03/2013 23:42   US Pelvis Complete  04/03/2013   CLINICAL DATA:  Pelvic pain.  EXAM: TRANSABDOMINAL  AND TRANSVAGINAL ULTRASOUND OF PELVIS  DOPPLER ULTRASOUND OF OVARIES  TECHNIQUE: Both transabdominal and transvaginal ultrasound examinations of the pelvis were performed. Transabdominal technique was performed for global imaging of the pelvis including uterus, ovaries, adnexal regions, and pelvic cul-de-sac.  It was necessary to proceed with endovaginal exam following the transabdominal exam to visualize the . Color and duplex Doppler ultrasound was utilized to evaluate blood flow to the ovaries.  COMPARISON:  None.  Multiple prior pelvic ultrasounds.  FINDINGS: Uterus  Measurements: 4.2 x 6.7 x 9.5 cm. Numerous uterine fibroids.  Endometrium  Thickness: 7.8 mm.  No focal abnormality visualized.  Right ovary  Measurements: 6.2 x 3.8 x 4.1 cm. There is a 4.6 x 2.8 x 3.6 cm cyst.  Left ovary  Measurements: 2.6 x 1.8 x 1.6 cm. Normal appearance/no adnexal mass.  Pulsed Doppler evaluation of both ovaries demonstrates normal low-resistance arterial and venous waveforms.  Other findings  Trace free pelvic fluid.  IMPRESSION: Numerous uterine fibroids.  Normal endometrial thickness.  4.6 cm right ovarian cyst.  No sonographic evidence for ovarian torsion.   Electronically Signed   By: Loralie Champagne M.D.   On: 04/03/2013 23:42   Ct Abdomen Pelvis W Contrast  04/04/2013   CLINICAL DATA:  Pelvic pain. Nausea, vomiting, abdominal distention.  EXAM: CT ABDOMEN AND PELVIS WITH CONTRAST  TECHNIQUE: Multidetector CT imaging of the abdomen and pelvis was performed using the standard protocol following bolus administration of intravenous contrast.  CONTRAST:  OMNIPAQUE IOHEXOL 300 MG/ML  SOLN  COMPARISON:  None.  FINDINGS: Lung bases are clear. No effusions. Heart is normal size.  Liver, gallbladder, spleen, pancreas, adrenals and kidneys are normal. Appendix is visualized and is normal. Large and small bowel are unremarkable.  Numerous high density, likely enhancing mass is within the uterus compatible with numerous  fibroids. There is an exophytic fibroid in the right adnexal region measuring up to 4.7 cm. 4.3 cm cystic area in the right posterior adnexa, likely right ovarian cyst. Trace free fluid in the pelvis. No free air or adenopathy. Aorta is normal caliber.  No acute bony abnormality.  IMPRESSION: Numerous enhancing uterine fibroids.  4.3 cm right ovarian cyst.   Electronically Signed   By: Charlett Nose M.D.   On: 04/04/2013 04:38  [place] Art/ven Flow Abd Pelv Doppler  04/03/2013   CLINICAL DATA:  Pelvic pain.  EXAM: TRANSABDOMINAL AND TRANSVAGINAL ULTRASOUND OF PELVIS  DOPPLER ULTRASOUND OF OVARIES  TECHNIQUE: Both transabdominal and transvaginal ultrasound examinations of the pelvis were performed. Transabdominal technique was performed for global imaging of the pelvis including uterus, ovaries, adnexal regions, and pelvic cul-de-sac.  It was necessary to proceed with endovaginal exam following the transabdominal exam to visualize the . Color and duplex Doppler ultrasound was utilized to evaluate blood flow to the ovaries.  COMPARISON:  None.  Multiple prior pelvic ultrasounds.  FINDINGS: Uterus  Measurements: 4.2 x 6.7 x 9.5 cm. Numerous uterine fibroids.  Endometrium  Thickness: 7.8 mm.  No focal abnormality visualized.  Right ovary  Measurements: 6.2 x 3.8 x 4.1 cm. There is a 4.6 x 2.8 x 3.6 cm cyst.  Left ovary  Measurements: 2.6 x 1.8 x 1.6 cm. Normal appearance/no adnexal mass.  Pulsed Doppler evaluation of both ovaries demonstrates normal low-resistance arterial and venous waveforms.  Other findings  Trace free pelvic fluid.  IMPRESSION: Numerous uterine fibroids.  Normal endometrial thickness.  4.6 cm right ovarian cyst.  No sonographic evidence for ovarian torsion.   Electronically Signed   By: Loralie Champagne M.D.   On: 04/03/2013 23:42    Antony Madura, PA-C 04/04/13 0530

## 2013-04-04 NOTE — ED Notes (Signed)
Spoke with Nancee Liter in maternal admitting about the patient and that she is being adimmitted to Dr.Mesiinger at [facility].

## 2013-04-05 LAB — URINE CULTURE

## 2013-04-07 NOTE — MAU Provider Note (Signed)
D/w pt POC and goals for pain control.  Possible treatment with Depot Lupron.  Started on percocet/Amitriptyline/naproxen.  Pt to follow-up at office.

## 2013-05-08 ENCOUNTER — Other Ambulatory Visit: Payer: Self-pay | Admitting: Family Medicine

## 2013-05-09 ENCOUNTER — Other Ambulatory Visit: Payer: Self-pay

## 2013-05-13 NOTE — Telephone Encounter (Signed)
Pt called and would like two things. One refills on Effexor and Tramadol sent to [facility] outpatient pharmacy. Second a nurse to call and talk to her about anxiety issues she is having. JW

## 2013-06-03 ENCOUNTER — Telehealth: Payer: Self-pay | Admitting: Family Medicine

## 2013-06-03 NOTE — Telephone Encounter (Signed)
Pt called and would like Dr. Gwenlyn Saran to call her concerning her depression. She really needs to talk to the doctor asap. jw

## 2013-06-03 NOTE — Telephone Encounter (Signed)
Called pt.

## 2013-06-03 NOTE — Telephone Encounter (Signed)
Called pt. 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  She takes the effoxor and amitriptyline, it does not help much. I  advised pt to see PCP to discuss and also to have the [employer] counselor fax the notes to Attn: Dr.Losq. Pt agreed. I also told the pt, that she could see M.Kane.  Lorenda Hatchet, Keala Drum Fwd. To PCP for review.

## 2013-06-25 ENCOUNTER — Encounter: Payer: Self-pay | Admitting: Family Medicine

## 2013-06-25 ENCOUNTER — Ambulatory Visit (INDEPENDENT_AMBULATORY_CARE_PROVIDER_SITE_OTHER): Payer: 59 | Admitting: Family Medicine

## 2013-06-25 VITALS — BP 111/76 | HR 71 | Temp 98.1°F | Ht 67.0 in | Wt 227.0 lb

## 2013-06-25 DIAGNOSIS — F329 Major depressive disorder, single episode, unspecified: Secondary | ICD-10-CM

## 2013-06-25 MED ORDER — PROPRANOLOL HCL ER 80 MG PO CP24: 80.0000 mg | ORAL_CAPSULE | Freq: Every day | ORAL | Status: DC

## 2013-06-25 MED ORDER — TRAMADOL HCL 50 MG PO TABS: 50.0000 mg | ORAL_TABLET | Freq: Three times a day (TID) | ORAL | Status: DC | PRN

## 2013-06-25 MED ORDER — NAPROXEN 500 MG PO TABS: 500.0000 mg | ORAL_TABLET | Freq: Two times a day (BID) | ORAL | Status: DC

## 2013-06-25 MED ORDER — VENLAFAXINE HCL 100 MG PO TABS: 100.0000 mg | ORAL_TABLET | Freq: Two times a day (BID) | ORAL | Status: DC

## 2013-06-25 MED ORDER — DIAZEPAM 5 MG PO TABS: 2.5000 mg | ORAL_TABLET | Freq: Every evening | ORAL | Status: DC | PRN

## 2013-06-25 NOTE — Patient Instructions (Signed)
I recommend that you meet with our psychologist, Dr. Spero Geralds, for help dealing with your anxiety and depression.  You can schedule an appointment with her by calling her directly at [phone]. Ask to be seen at the [facility].   Please follow up with me in 3 weeks.

## 2013-06-28 NOTE — Progress Notes (Signed)
Patient ID: [name]    DOB: [birth year], 27 y.o.   MRN: [id number] --- Subjective:  [name] is a 27 y.o.female who presents for follow up on depression. - depression: her mother passed away unexpectedly last month and patient's anxiety and feelings of sadness have become more frequent. She was close to her mother and she was her only family support system. Since then, she has had more anxiety attacks. She feels like a failure in the sense that she did not get married or have children before her mother died. 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 She has crying spells at work when patients are terminally ill or have died. She used to live alone but her friends insisted that she live with them and she has now moved to live with a couple of her friends. She went to a grief counselor for 1 hr session which she says made it worst because she retold what happened with her mother. To cope with things, she likes to do crossword puzzles which occupies her mind. She needs to stay busy. She has a lot of difficulty sleeping,finding it difficult to fall asleep and stay asleep. She denies any SI/HI at this time. She has been taking Effexor 75mg  bid which used to help more than it currently does. She feels like it doesn't help her with sleep or anxiety. She also takes amitriptyline for low back pain.   PHQ9: 21, somewhat difficult  ROS: see HPI Past Medical History: reviewed and updated medications and allergies. Social History: Tobacco: none  Objective: Filed Vitals:   06/25/13 1401  BP: 111/76  Pulse: 71  Temp: 98.1 F (36.7 C)    Physical Examination:   General appearance - alert, in no acute distress, flat affect Chest - clear to auscultation, no wheezes, rales or rhonchi, symmetric air entry Heart - normal rate, regular rhythm, normal S1, S2, no murmurs

## 2013-06-30 NOTE — Assessment & Plan Note (Addendum)
Depression with component of anxiety. Worsening by acute event of mother's recent death.  Will increase effexor from 75mg  to 100mg  twice a day and urged her to call Dr. Pascal Lux for evaluation at the mood clinic to better hone in on diagnosis and treatment as well as for counseling which patient is interested in.  Rx for low dose valium for sleep in the short term.explained to patient that this will only be short term medicine until her medications for depression and anxiety are better adjusted.  Follow up with me in 2-3 weeks.

## 2013-07-01 ENCOUNTER — Telehealth: Payer: Self-pay | Admitting: Family Medicine

## 2013-07-01 NOTE — Telephone Encounter (Signed)
Called patient to check in on her. She states that she is feeling better and is feeling like the increase in dose in Effexor is helping. She has been taking the valium at night time for sleep which has helped. She denies any SI/HI today. She has not yet called Dr. Pascal Lux but says that she was planning on doing it a little later today.   Marena Chancy, PGY-3 Family Medicine Resident

## 2013-07-03 ENCOUNTER — Telehealth: Payer: Self-pay | Admitting: Psychology

## 2013-07-03 NOTE — Telephone Encounter (Signed)
Lubna left a VM requesting a [facility] appointment.  First available is in February.  Called her back and left a VM.  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  Will discuss when she calls back.  Will let PCP know.

## 2013-07-03 NOTE — Telephone Encounter (Signed)
Jadence called.  We scheduled for February 4th at 11:00.  I told her the following: -  If she is unable to make the appointment she needs to call me. -  If she misses the appointment without a phone call, I won't be able to schedule her back in my clinic. She voiced an understanding and was able to repeat the appointment date and time back to me.

## 2013-07-18 ENCOUNTER — Other Ambulatory Visit: Payer: Self-pay | Admitting: Family Medicine

## 2013-07-23 ENCOUNTER — Ambulatory Visit: Payer: 59 | Admitting: Family Medicine

## 2013-08-07 ENCOUNTER — Ambulatory Visit: Payer: 59 | Admitting: Psychology

## 2013-08-07 ENCOUNTER — Telehealth: Payer: Self-pay | Admitting: Psychology

## 2013-08-07 NOTE — Telephone Encounter (Signed)
[name] called at 2:24 and left a VM.  She states that I scheduled her for 4:00.  I am confident this is not the case.  I consulted with a colleague and we decided that it was reasonable to give her one chance.  We scheduled for the first available which was March 18th at 9:00.  She was able to repeat the appointment date and time back to me.  She knows that the clinic is located in the same building where she sees Dr. Otis Dials.  If she is unable to attend, I need a phone call or I will not be able to schedule her.  She voiced an understanding.

## 2013-08-07 NOTE — Telephone Encounter (Addendum)
Patient did not keep appointment and did not call.  Will not be able to schedule patient back in our clinic.

## 2013-08-23 ENCOUNTER — Other Ambulatory Visit: Payer: Self-pay | Admitting: Family Medicine

## 2013-08-23 DIAGNOSIS — F32A Depression, unspecified: Secondary | ICD-10-CM

## 2013-08-23 DIAGNOSIS — F329 Major depressive disorder, single episode, unspecified: Secondary | ICD-10-CM

## 2013-08-23 NOTE — Telephone Encounter (Signed)
Patient also called requesting these refills.

## 2013-08-26 ENCOUNTER — Encounter (HOSPITAL_COMMUNITY): Payer: Self-pay | Admitting: Emergency Medicine

## 2013-08-26 DIAGNOSIS — R142 Eructation: Secondary | ICD-10-CM | POA: Insufficient documentation

## 2013-08-26 DIAGNOSIS — R143 Flatulence: Secondary | ICD-10-CM

## 2013-08-26 DIAGNOSIS — R141 Gas pain: Secondary | ICD-10-CM | POA: Insufficient documentation

## 2013-08-26 DIAGNOSIS — J45909 Unspecified asthma, uncomplicated: Secondary | ICD-10-CM | POA: Insufficient documentation

## 2013-08-26 DIAGNOSIS — Z8742 Personal history of other diseases of the female genital tract: Secondary | ICD-10-CM | POA: Insufficient documentation

## 2013-08-26 DIAGNOSIS — R109 Unspecified abdominal pain: Secondary | ICD-10-CM | POA: Insufficient documentation

## 2013-08-26 DIAGNOSIS — Z79899 Other long term (current) drug therapy: Secondary | ICD-10-CM | POA: Insufficient documentation

## 2013-08-26 LAB — COMPREHENSIVE METABOLIC PANEL
ALBUMIN: 3.7 g/dL (ref 3.5–5.2)
ALK PHOS: 81 U/L (ref 39–117)
ALT: 12 U/L (ref 0–35)
AST: 16 U/L (ref 0–37)
BUN: 10 mg/dL (ref 6–23)
CO2: 26 mEq/L (ref 19–32)
Calcium: 9.3 mg/dL (ref 8.4–10.5)
Chloride: 101 mEq/L (ref 96–112)
Creatinine, Ser: 0.64 mg/dL (ref 0.50–1.10)
GFR calc Af Amer: >90 mL/min (ref >90)
GFR calc non Af Amer: >90 mL/min (ref >90)
Glucose, Bld: 105 mg/dL — ABNORMAL HIGH (ref 70–99)
Potassium: 4.2 mEq/L (ref 3.7–5.3)
SODIUM: 140 meq/L (ref 137–147)
Total Bilirubin: 0.3 mg/dL (ref 0.3–1.2)
Total Protein: 7.5 g/dL (ref 6.0–8.3)

## 2013-08-26 LAB — CBC WITH DIFFERENTIAL/PLATELET
BASOS ABS: 0 10*3/uL (ref 0.0–0.1)
Basophils Relative: 0 % (ref 0–1)
EOS ABS: 0.1 10*3/uL (ref 0.0–0.7)
Eosinophils Relative: 2 % (ref 0–5)
HCT: 35.7 % — ABNORMAL LOW (ref 36.0–46.0)
Hemoglobin: 12.9 g/dL (ref 12.0–15.0)
Lymphocytes Relative: 40 % (ref 12–46)
Lymphs Abs: 2.6 10*3/uL (ref 0.7–4.0)
MCH: 32.9 pg (ref 26.0–34.0)
MCHC: 36.1 g/dL — ABNORMAL HIGH (ref 30.0–36.0)
MCV: 91.1 fL (ref 78.0–100.0)
Monocytes Absolute: 0.3 10*3/uL (ref 0.1–1.0)
Monocytes Relative: 4 % (ref 3–12)
NEUTROS ABS: 3.4 10*3/uL (ref 1.7–7.7)
Neutrophils Relative %: 53 % (ref 43–77)
PLATELETS: 303 10*3/uL (ref 150–400)
RBC: 3.92 MIL/uL (ref 3.87–5.11)
RDW: 12.5 % (ref 11.5–15.5)
WBC: 6.4 10*3/uL (ref 4.0–10.5)

## 2013-08-26 NOTE — ED Notes (Signed)
Pt has history of fibroids and is here with lower abdominal pain with distention.  LMP stopped 2 days ago

## 2013-08-27 ENCOUNTER — Emergency Department (HOSPITAL_COMMUNITY)
Admission: EM | Admit: 2013-08-27 | Discharge: 2013-08-27 | Discharge status: Against Medical Advice | Payer: 59 | Attending: Emergency Medicine | Admitting: Emergency Medicine

## 2013-08-27 NOTE — ED Notes (Signed)
Called x's 3 without response 

## 2013-08-30 ENCOUNTER — Ambulatory Visit (INDEPENDENT_AMBULATORY_CARE_PROVIDER_SITE_OTHER): Payer: 59 | Admitting: Family Medicine

## 2013-08-30 ENCOUNTER — Encounter: Payer: Self-pay | Admitting: Family Medicine

## 2013-08-30 VITALS — BP 118/75 | HR 82 | Temp 98.7°F | Ht 67.0 in | Wt 228.0 lb

## 2013-08-30 DIAGNOSIS — F32A Depression, unspecified: Secondary | ICD-10-CM

## 2013-08-30 DIAGNOSIS — F3289 Other specified depressive episodes: Secondary | ICD-10-CM

## 2013-08-30 DIAGNOSIS — M545 Low back pain, unspecified: Secondary | ICD-10-CM

## 2013-08-30 DIAGNOSIS — F329 Major depressive disorder, single episode, unspecified: Secondary | ICD-10-CM

## 2013-08-30 DIAGNOSIS — F411 Generalized anxiety disorder: Secondary | ICD-10-CM

## 2013-08-30 MED ORDER — HYDROXYZINE HCL 50 MG PO TABS: 50.0000 mg | ORAL_TABLET | Freq: Every day | ORAL | Status: DC

## 2013-08-30 MED ORDER — METHOCARBAMOL 750 MG PO TABS: 750.0000 mg | ORAL_TABLET | Freq: Four times a day (QID) | ORAL | Status: DC

## 2013-08-30 MED ORDER — LIDOCAINE 5 % EX PTCH: 1.0000 | MEDICATED_PATCH | CUTANEOUS | Status: DC

## 2013-08-30 NOTE — Patient Instructions (Signed)
For the back pain, I am referring you to sports medicine.  You can try capsaicin cream before the lidoderm patch gets approved.   For the anxiety, let's try hydroxyzine. Please follow up with Dr. Gwenlyn Saran.   I recommend that you meet with our Registered Dietitian (Nutritionist), Dr. Juventino Slovak for help addressing your dietary and eating habits.  You can schedule an appointment with her by calling her directly at [phone].   Follow up with me in 1-2 months.

## 2013-08-30 NOTE — Assessment & Plan Note (Addendum)
Depression and anxiety not well controled at this time. phq9 of 23. Suspect that her mother's recent death is playing a role.  On high dose of effexor. Difficulty sleeping could be from effexor.  Will continue effexor for now until she is evaluated by mood clinic. Patient would prefer not to continue on valium as she is concerned about dependence. I share her concern.  Will add hydroxyzine at night time for sleep and anxiety.  She will likely benefit from therapy as well.
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
## 2013-08-30 NOTE — Assessment & Plan Note (Addendum)
Low back pain with association of discomfort to the right thigh. No evidence of trochanteric bursitis. Normal range of motion of hip joint which is reassuring.  Would like to refer patient to sports medicine for further evaluation.  - In the meantime, will not refill tramadol since patient doesn't see much benefit from it.  - change flexeril to robaxin for muscle relaxer. She does state that when she was on valium for the anxiety it did help with the muscle tightness, but I would like to avoid benzo if possible.  - Rx for lidoderm patch sent - in meantime, capsaicin cream otc to try.  - patient states that she has been in PT in the past and did not feel much benefit from it.  - also reviewed importance of weight loss. Patient thinks she can increase her walking from twice a week to three times a week. Patient also given number of Dr. Jenne Campus to call for nutrition consult.  - on review of records, L spine xray was ordered but not done. Called patient and left message after appointment to ask her to get xray prior to her sports medicine appointment.

## 2013-08-30 NOTE — Assessment & Plan Note (Signed)
Likely contributing to low back pain.  - number for nutritionist given to patient - increase exercise from twice a week to three times a week.

## 2013-09-02 ENCOUNTER — Encounter: Payer: Self-pay | Admitting: Family Medicine

## 2013-09-05 ENCOUNTER — Other Ambulatory Visit: Payer: Self-pay | Admitting: Family Medicine

## 2013-09-05 ENCOUNTER — Ambulatory Visit
Admission: RE | Admit: 2013-09-05 | Discharge: 2013-09-05 | Disposition: A | Discharge status: Home / Self Care | Payer: 59 | Source: Ambulatory Visit | Attending: Family Medicine | Admitting: Family Medicine

## 2013-09-05 ENCOUNTER — Encounter: Payer: Self-pay | Admitting: Family Medicine

## 2013-09-05 DIAGNOSIS — M545 Low back pain, unspecified: Secondary | ICD-10-CM

## 2013-09-09 ENCOUNTER — Ambulatory Visit: Payer: 59 | Admitting: Family Medicine

## 2013-09-13 ENCOUNTER — Telehealth: Payer: Self-pay | Admitting: Family Medicine

## 2013-09-13 ENCOUNTER — Ambulatory Visit (INDEPENDENT_AMBULATORY_CARE_PROVIDER_SITE_OTHER): Payer: 59 | Admitting: Family Medicine

## 2013-09-13 ENCOUNTER — Encounter: Payer: Self-pay | Admitting: Family Medicine

## 2013-09-13 VITALS — BP 135/80 | HR 66 | Ht 67.0 in | Wt 228.0 lb

## 2013-09-13 DIAGNOSIS — M545 Low back pain, unspecified: Secondary | ICD-10-CM

## 2013-09-13 NOTE — Telephone Encounter (Signed)
Pt would like to be called by Dr Otis Dials about her back and her visit to sports medicine

## 2013-09-13 NOTE — Progress Notes (Signed)
Patient ID: [name], female   DOB: 06-02-1986, 28 y.o.   MRN: [id number] 28 year old female with a complaint of back pain.  Onset approximately 6 months ago. Pain worse with movement. She was taking Tylenol without relief so she is stopped. Recently placed on Lidoderm patch. Also taking Robaxin. Neither the seems to be helping significantly as of yet. In the past she did 1 month of physical therapy that did not make a significant difference.  Denies any significant or inciting injury. No weakness or numbness.  Pertinent past medical history: Morbid obesity, depression  Social history: Nonsmoker, no alcohol  Review of systems: As per history of present illness otherwise all systems negative  Examination: BP 135/80  Pulse 66  Ht 5\' 7"  (1.702 m)  Wt 228 lb (103.42 kg)  BMI 35.70 kg/m2  LMP 08/22/2013 Obese 28 year old [place] female awake alert and oriented in no acute distress  Back Exam:  Inspection: Unremarkable  Motion: Flexion 45 deg, Extension 15 deg, Side Bending to 30 deg bilaterally,  Rotation to 30 deg bilaterally  SLR laying: Negative  XSLR laying: Negative  Palpable tenderness: mild left lower rhomboid/trap Sensory change: Gross sensation intact to all lumbar and sacral dermatomes.  Reflexes: 2+ at both patellar tendons, 2+ at achilles tendons, Babinski's downgoing.  Strength at foot  Plantar-flexion: 5/5 Dorsi-flexion: 5/5 Eversion: 5/5 Inversion: 5/5  Leg strength  Quad: 5/5 Hamstring: 5/5 Hip flexor: 5/5 Hip abductors: 5/5  Gait unremarkable.

## 2013-09-13 NOTE — Assessment & Plan Note (Signed)
We've given her an extension back exercise program to do at home. Advised her to continue the Lidoderm patch Robaxin as needed. She will followup with [place] or her primary care doctor.

## 2013-09-13 NOTE — Telephone Encounter (Signed)
Called pt. She was seen by Dr.Neal today. Advised to do back exercises, use robaxin as needed and lidoderm, and f/up with PCP. No need for PCP to call. Pt agreed. Javier Glazier, Gerrit Heck

## 2013-09-18 ENCOUNTER — Ambulatory Visit (INDEPENDENT_AMBULATORY_CARE_PROVIDER_SITE_OTHER): Payer: 59 | Admitting: Psychology

## 2013-09-18 DIAGNOSIS — F3181 Bipolar II disorder: Secondary | ICD-10-CM | POA: Insufficient documentation

## 2013-09-18 DIAGNOSIS — F3189 Other bipolar disorder: Secondary | ICD-10-CM
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
## 2013-09-18 NOTE — Patient Instructions (Signed)
Please schedule a follow-up with Dr. Otis Dials in about a week to two weeks.   We are going to put our heads together with Dr. Otis Dials to see what medication might be helpful for you. You identified that getting counseling is an important part of feeling better.  Hospice is an option for grief counseling.  More broad based counseling could be helpful as well.  There are [facility] psychologists that can see you to work on these issues. The amount of alcohol you are drinking will get in the way of you feeling better and will make the medicine much less effective.   Dr. Tammi Klippel thought it was reasonable to start the taper on your Effexor.  You can do this by decreasing to 150 mg a day (cut one of your pills in half).  Call me at [phone] if you have difficulty with this.

## 2013-09-20 ENCOUNTER — Telehealth: Payer: Self-pay | Admitting: Family Medicine

## 2013-09-20 DIAGNOSIS — F3181 Bipolar II disorder: Secondary | ICD-10-CM

## 2013-09-20 MED ORDER — LURASIDONE HCL 20 MG PO TABS: 20.0000 mg | ORAL_TABLET | Freq: Every day | ORAL | Status: DC

## 2013-09-20 NOTE — Telephone Encounter (Signed)
Called patient to discuss treatment options and her diagnosis of bipolar after she met with the mood disorder clinic.  Told her that the [place] thought she fit the description of bipolar 2. Also explained that they recommended to start latuda. Also recommended that she pursue gried counseling either at [facility] or with Hospice.  She agreed to starting latuda and she will follow up with me in 2 weeks. Sending latuda 79m daily to pharmacy.  Recent CMP and CBC normal. Patient needs TSH and lipid panel. Will do this at her visit in 2 weeks.   SLiam Graham PGY-3 Family Medicine Resident

## 2013-09-24 NOTE — Telephone Encounter (Signed)
Pt states she needs a letter showing she is approved for light duty. She can pick up the letter Please advise

## 2013-09-24 NOTE — Telephone Encounter (Signed)
Will fwd to MD.  [name], CMA  

## 2013-09-24 NOTE — Telephone Encounter (Signed)
Called patient back and let her know that the letter would be ready for her to pick up today.   Liam Graham, PGY-3 Family Medicine Resident

## 2013-09-25 ENCOUNTER — Telehealth: Payer: Self-pay | Admitting: Family Medicine

## 2013-09-25 NOTE — Telephone Encounter (Signed)
Pt states the letter needs to be more specific about "direct patient care" She wants the direct patient care removed.  She just doesn't want to have to lift them Please advise

## 2013-09-27 NOTE — Telephone Encounter (Signed)
Pt called to inform the doctor that she is dropping off paperwork to be filled out. She also would like to doctor to call her about the letter that she wrote. Blima Rich

## 2013-09-30 NOTE — Telephone Encounter (Signed)
Please let patient know that the [facility] paperwork is ready for her to pick up.   Liam Graham, PGY-3 Family Medicine Resident

## 2013-09-30 NOTE — Telephone Encounter (Signed)
Pt notified.  Yola Paradiso L, CMA  

## 2013-10-02 ENCOUNTER — Encounter: Payer: Self-pay | Admitting: Family Medicine

## 2013-10-02 ENCOUNTER — Ambulatory Visit (INDEPENDENT_AMBULATORY_CARE_PROVIDER_SITE_OTHER): Payer: 59 | Admitting: Family Medicine

## 2013-10-02 VITALS — BP 120/70 | HR 78 | Ht 67.0 in | Wt 230.0 lb

## 2013-10-02 DIAGNOSIS — F3189 Other bipolar disorder: Secondary | ICD-10-CM

## 2013-10-02 DIAGNOSIS — F319 Bipolar disorder, unspecified: Secondary | ICD-10-CM

## 2013-10-02 DIAGNOSIS — F3181 Bipolar II disorder: Secondary | ICD-10-CM

## 2013-10-02 LAB — LIPID PANEL
CHOL/HDL RATIO: 5.1 ratio
Cholesterol: 204 mg/dL — ABNORMAL HIGH (ref 0–200)
HDL: 40 mg/dL (ref >39)
LDL Cholesterol: 115 mg/dL — ABNORMAL HIGH (ref 0–99)
Triglycerides: 244 mg/dL — ABNORMAL HIGH (ref <150)
VLDL: 49 mg/dL — AB (ref 0–40)

## 2013-10-02 LAB — TSH: TSH: 0.935 u[IU]/mL (ref 0.350–4.500)

## 2013-10-02 MED ORDER — VENLAFAXINE HCL 100 MG PO TABS: 150.0000 mg | ORAL_TABLET | Freq: Every day | ORAL | Status: DC

## 2013-10-02 MED ORDER — LURASIDONE HCL 40 MG PO TABS: 40.0000 mg | ORAL_TABLET | Freq: Every day | ORAL | Status: DC

## 2013-10-02 NOTE — Patient Instructions (Signed)
Follow up in 3 weeks to see how the new dose is doing.

## 2013-10-02 NOTE — Progress Notes (Signed)
Patient ID: [name]    DOB: 10-17-1985, 28 y.o.   MRN: [id number] --- Subjective:  [name] is a 28 y.o.female who presents for follow up on depression and bipolar. She was started on latuda 20mg  daily on 09/20/13.   Tolerability:  - side effects: antsy, jittery but thinks this could be related to the tapering of effexor - missed doses in 1 week: 0, takes it with breakfast  Safety:  - SI: none - HI: none  Efficacy: - mood improvement: still "all over the place" which she describes as mood that is up and down but feels like it is better than before being on the medicine.  - Effexor taper: 150mg  daily. Has been tapering since 09/18/13.   - Grief counseling: has not yet been seen, due to other concerns with work, but has the resources she needs and plans on making appointment.   ROS: see HPI Past Medical History: reviewed and updated medications and allergies. Social History: Tobacco: none  Objective: Filed Vitals:   10/02/13 1012  BP: 120/70  Pulse: 78    Physical Examination:   General appearance - alert, well appearing, and in no distress Chest - clear to auscultation, no wheezes, rales or rhonchi, symmetric air entry Heart - normal rate, regular rhythm, normal S1, S2, no murmurs

## 2013-10-02 NOTE — Assessment & Plan Note (Addendum)
Tolerating latuda 20mg  daily well. Symptoms still not optimally controled. Will increase to 40mg  daily with follow up in 2-3 weeks.  Cbc and CMP normal at last visit.  Will check TSH and lipid panel today.  Also recommended that she pursue grief counseling to help her cope with her mother's death.  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

## 2013-10-03 ENCOUNTER — Encounter: Payer: Self-pay | Admitting: Family Medicine

## 2013-10-05 ENCOUNTER — Encounter (HOSPITAL_COMMUNITY): Payer: Self-pay | Admitting: *Deleted

## 2013-10-05 ENCOUNTER — Inpatient Hospital Stay (HOSPITAL_COMMUNITY): Payer: 59

## 2013-10-05 ENCOUNTER — Observation Stay (HOSPITAL_COMMUNITY)
Admission: AD | Admit: 2013-10-05 | Discharge: 2013-10-07 | Disposition: A | Discharge status: Home / Self Care | Payer: 59 | Source: Ambulatory Visit | Attending: Obstetrics and Gynecology | Admitting: Obstetrics and Gynecology

## 2013-10-05 DIAGNOSIS — D259 Leiomyoma of uterus, unspecified: Secondary | ICD-10-CM

## 2013-10-05 DIAGNOSIS — D251 Intramural leiomyoma of uterus: Secondary | ICD-10-CM | POA: Insufficient documentation

## 2013-10-05 DIAGNOSIS — F329 Major depressive disorder, single episode, unspecified: Secondary | ICD-10-CM | POA: Insufficient documentation

## 2013-10-05 DIAGNOSIS — J45909 Unspecified asthma, uncomplicated: Secondary | ICD-10-CM | POA: Insufficient documentation

## 2013-10-05 DIAGNOSIS — R1031 Right lower quadrant pain: Secondary | ICD-10-CM

## 2013-10-05 DIAGNOSIS — N949 Unspecified condition associated with female genital organs and menstrual cycle: Principal | ICD-10-CM | POA: Insufficient documentation

## 2013-10-05 DIAGNOSIS — F3289 Other specified depressive episodes: Secondary | ICD-10-CM | POA: Insufficient documentation

## 2013-10-05 DIAGNOSIS — R110 Nausea: Secondary | ICD-10-CM | POA: Insufficient documentation

## 2013-10-05 DIAGNOSIS — D252 Subserosal leiomyoma of uterus: Secondary | ICD-10-CM | POA: Insufficient documentation

## 2013-10-05 DIAGNOSIS — N83209 Unspecified ovarian cyst, unspecified side: Secondary | ICD-10-CM | POA: Insufficient documentation

## 2013-10-05 LAB — CBC
HCT: 37.3 % (ref 36.0–46.0)
Hemoglobin: 13.5 g/dL (ref 12.0–15.0)
MCH: 32.8 pg (ref 26.0–34.0)
MCHC: 36.2 g/dL — ABNORMAL HIGH (ref 30.0–36.0)
MCV: 90.5 fL (ref 78.0–100.0)
PLATELETS: 334 10*3/uL (ref 150–400)
RBC: 4.12 MIL/uL (ref 3.87–5.11)
RDW: 12.2 % (ref 11.5–15.5)
WBC: 8.6 10*3/uL (ref 4.0–10.5)

## 2013-10-05 LAB — URINALYSIS, ROUTINE W REFLEX MICROSCOPIC
Bilirubin Urine: NEGATIVE
Glucose, UA: NEGATIVE mg/dL
Ketones, ur: NEGATIVE mg/dL
Leukocytes, UA: NEGATIVE
Nitrite: NEGATIVE
Protein, ur: NEGATIVE mg/dL
SPECIFIC GRAVITY, URINE: 1.015 (ref 1.005–1.030)
Urobilinogen, UA: 0.2 mg/dL (ref 0.0–1.0)
pH: 7 (ref 5.0–8.0)

## 2013-10-05 LAB — URINE MICROSCOPIC-ADD ON

## 2013-10-05 LAB — POCT PREGNANCY, URINE: PREG TEST UR: NEGATIVE

## 2013-10-05 MED ORDER — DIPHENHYDRAMINE HCL 50 MG/ML IJ SOLN
25.0000 mg | Freq: Once | INTRAMUSCULAR | Status: AC
  Administered 2013-10-05: 25 mg via INTRAMUSCULAR
  Filled 2013-10-05: qty 1

## 2013-10-05 MED ORDER — MORPHINE SULFATE 4 MG/ML IJ SOLN
4.0000 mg | Freq: Once | INTRAMUSCULAR | Status: AC
  Administered 2013-10-05: 4 mg via INTRAVENOUS
  Filled 2013-10-05: qty 1

## 2013-10-05 MED ORDER — LACTATED RINGERS IV SOLN
INTRAVENOUS | Status: DC
  Administered 2013-10-05: 23:00:00 via INTRAVENOUS

## 2013-10-05 MED ORDER — HYDROMORPHONE HCL PF 1 MG/ML IJ SOLN
1.0000 mg | Freq: Once | INTRAMUSCULAR | Status: AC
  Administered 2013-10-05: 1 mg via INTRAMUSCULAR
  Filled 2013-10-05: qty 1

## 2013-10-05 MED ORDER — HYDROMORPHONE HCL PF 1 MG/ML IJ SOLN: 2.0000 mg | Freq: Once | INTRAMUSCULAR | Status: DC

## 2013-10-05 NOTE — MAU Note (Signed)
Pt called out and request to see Dr Melba Coon. Pt states is still hurting. Dr Melba Coon in to see pt and do exam.

## 2013-10-05 NOTE — MAU Note (Signed)
Pt brought back to Rm #5 from lobby via w/c. Helped to bed. Pt crying and rocking with abd pain. Sister at bedside

## 2013-10-05 NOTE — MAU Note (Signed)
Have fibroids and had surgery in May. Have been having abd pain some but worse last 3 hours. Having some vaginal bleeding. LMP 3/29 and period had stopped and now i'm bleeding again

## 2013-10-05 NOTE — MAU Note (Signed)
Pt to BR via w/c. UA sent. Pt ambulated back to room per request. RN ambulated with pt

## 2013-10-05 NOTE — MAU Provider Note (Signed)
History     CSN: [id number]  Arrival date and time: 10/05/13 2133   First Provider Initiated Contact with Patient 10/05/13 2244      Chief Complaint  Patient presents with  . Abdominal Pain   HPI  [name] is a 28 y.o. female G0P0 who presents to MAU with complaints of abdominal pain. Pt has chronic abdominal pain, multiple large fibroids with myomectomy, and is currently being followed closely by Dr. Jadene Trevino office. The patient has a pain contract with Dr. Melba Trevino and is in the process of discussing myomectomy vs. Hysterectomy. Pt takes dilaudid at home; last dose was 5:00 pm. Pt states the pain she is worse than what she has experienced before and has progressively gotten worse throughout the day. Pt requests to see Dr. Melba Trevino.   OB History   Grav Para Term Preterm Abortions TAB SAB Ect Mult Living   0               Past Medical History  Diagnosis Date  . Allergy     seasonal allergies  . Pelvic cyst     Seen by gynecologist Dr. Harrington Trevino. Scheduled for ex lap on 12/02/11  . Pneumonia   . Depression   . Fibroids   . Asthma     Past Surgical History  Procedure Laterality Date  . No past surgeries    . Myomectomy  12/02/2011    Procedure: MYOMECTOMY;  Surgeon: Katherine Height, MD;  Location: [facility];  Service: Gynecology;  Laterality: N/A;    Family History  Problem Relation Age of Onset  . Heart disease Mother     History  Substance Use Topics  . Smoking status: Never Smoker   . Smokeless tobacco: Never Used  . Alcohol Use: No    Allergies:  Allergies  Allergen Reactions  . Dilaudid [Hydromorphone Hcl] Itching and Swelling  . Clindamycin/Lincomycin   . Penicillins Other (See Comments)    Unknown - childhood reaction    Prescriptions prior to admission  Medication Sig Dispense Refill  . albuterol (PROVENTIL HFA;VENTOLIN HFA) 108 (90 BASE) MCG/ACT inhaler Inhale 2 puffs into the lungs every 4 (four) hours as needed for wheezing or shortness of breath.       Marland Kitchen leuprolide (LUPRON) 11.25 MG injection Inject 11.25 mg into the muscle every 3 (three) months.      . lidocaine (LIDODERM) 5 % Place 1 patch onto the skin daily. Remove & Discard patch within 12 hours or as directed by MD  30 patch  1  . lurasidone (LATUDA) 40 MG TABS tablet Take 1 tablet (40 mg total) by mouth daily with breakfast.  30 tablet  1  . methocarbamol (ROBAXIN) 750 MG tablet Take 1 tablet (750 mg total) by mouth 4 (four) times daily.  120 tablet  1  . Multiple Vitamin (MULTIVITAMIN WITH MINERALS) TABS tablet Take 1 tablet by mouth daily.      . propranolol ER (INDERAL LA) 80 MG 24 hr capsule Take 80 mg by mouth daily.      . SUMAtriptan (IMITREX) 50 MG tablet Take 50 mg by mouth every 2 (two) hours as needed for migraine or headache. May repeat in 2 hours if headache persists or recurs.      . venlafaxine (EFFEXOR) 100 MG tablet Take 1.5 tablets (150 mg total) by mouth daily.  30 tablet  0  . naproxen (NAPROSYN) 500 MG tablet Take 500 mg by mouth 2 (two) times daily with a meal.  Results for orders placed during the hospital encounter of 10/05/13 (from the past 48 hour(s))  CBC     Status: Abnormal   Collection Time    10/05/13 10:25 PM      Result Value Ref Range   WBC 8.6  4.0 - 10.5 K/uL   RBC 4.12  3.87 - 5.11 MIL/uL   Hemoglobin 13.5  12.0 - 15.0 g/dL   HCT 37.3  36.0 - 46.0 %   MCV 90.5  78.0 - 100.0 fL   MCH 32.8  26.0 - 34.0 pg   MCHC 36.2 (*) 30.0 - 36.0 g/dL   RDW 12.2  11.5 - 15.5 %   Platelets 334  150 - 400 K/uL  URINALYSIS, ROUTINE W REFLEX MICROSCOPIC     Status: Abnormal   Collection Time    10/05/13 11:33 PM      Result Value Ref Range   Color, Urine YELLOW  YELLOW   APPearance CLEAR  CLEAR   Specific Gravity, Urine 1.015  1.005 - 1.030   pH 7.0  5.0 - 8.0   Glucose, UA NEGATIVE  NEGATIVE mg/dL   Hgb urine dipstick LARGE (*) NEGATIVE   Bilirubin Urine NEGATIVE  NEGATIVE   Ketones, ur NEGATIVE  NEGATIVE mg/dL   Protein, ur NEGATIVE  NEGATIVE  mg/dL   Urobilinogen, UA 0.2  0.0 - 1.0 mg/dL   Nitrite NEGATIVE  NEGATIVE   Leukocytes, UA NEGATIVE  NEGATIVE  URINE MICROSCOPIC-ADD ON     Status: Abnormal   Collection Time    10/05/13 11:33 PM      Result Value Ref Range   Squamous Epithelial / LPF FEW (*) RARE   WBC, UA 0-2  <3 WBC/hpf   RBC / HPF 0-2  <3 RBC/hpf   Bacteria, UA RARE  RARE   Urine-Other MUCOUS PRESENT    POCT PREGNANCY, URINE     Status: None   Collection Time    10/05/13 11:41 PM      Result Value Ref Range   Preg Test, Ur NEGATIVE  NEGATIVE   Comment:            THE SENSITIVITY OF THIS     METHODOLOGY IS >24 mIU/mL    Review of Systems  Constitutional: Negative for fever and chills.  Gastrointestinal: Positive for nausea and abdominal pain (+ bilateral lower abdomen ). Negative for vomiting.   Physical Exam   Blood pressure 128/80, pulse 74, temperature 99 F (37.2 C), resp. rate 18, last menstrual period 09/29/2013.  Physical Exam  Constitutional: She is oriented to person, place, and time. She appears distressed.  Respiratory: Effort normal.  GI:  Unable to examine her abdomen due to pain   Neurological: She is alert and oriented to person, place, and time.  Skin: Skin is warm.  Psychiatric:  Pt has her head in the pillow, rocking back and fourth in the bed.     MAU Course  Procedures None  MDM Pelvic US  Dilaudid 1 mg with benadryl per patient requests due to itching 10 minuets after dilaudid was given patient called out and requested more pain medication. Stating that the medication was not helping. We discussed trying Morphine.  11:00- Dr. Melba Trevino called and discussed plan of care. Dr. Melba Trevino to come to MAU to evaluate the patient   Assessment and Plan   Dr. Melba Trevino to MAU and resumes plan of care.    Katherine Hillock Moncia Annas, NP  10/05/2013, 10:46 PM

## 2013-10-06 DIAGNOSIS — D259 Leiomyoma of uterus, unspecified: Secondary | ICD-10-CM | POA: Diagnosis present

## 2013-10-06 MED ORDER — HYDROMORPHONE HCL 2 MG PO TABS
2.0000 mg | ORAL_TABLET | Freq: Four times a day (QID) | ORAL | Status: DC | PRN
  Administered 2013-10-06 – 2013-10-07 (×4): 2 mg via ORAL
  Filled 2013-10-06 (×4): qty 1

## 2013-10-06 MED ORDER — DIPHENHYDRAMINE HCL 12.5 MG/5ML PO ELIX: 12.5000 mg | ORAL_SOLUTION | Freq: Four times a day (QID) | ORAL | Status: DC | PRN

## 2013-10-06 MED ORDER — IBUPROFEN 800 MG PO TABS
800.0000 mg | ORAL_TABLET | Freq: Three times a day (TID) | ORAL | Status: DC | PRN
  Administered 2013-10-06: 800 mg via ORAL
  Filled 2013-10-06: qty 1

## 2013-10-06 MED ORDER — NAPROXEN 500 MG PO TABS: 500.0000 mg | ORAL_TABLET | Freq: Two times a day (BID) | ORAL | Status: DC

## 2013-10-06 MED ORDER — POLYETHYLENE GLYCOL 3350 17 G PO PACK: 17.0000 g | PACK | Freq: Every day | ORAL | Status: DC

## 2013-10-06 MED ORDER — PROMETHAZINE HCL 25 MG PO TABS: 12.5000 mg | ORAL_TABLET | Freq: Two times a day (BID) | ORAL | Status: DC | PRN

## 2013-10-06 MED ORDER — ONDANSETRON HCL 4 MG/2ML IJ SOLN
4.0000 mg | Freq: Four times a day (QID) | INTRAMUSCULAR | Status: DC | PRN
Start: 2013-10-06 — End: 2013-10-06
  Administered 2013-10-06: 4 mg via INTRAVENOUS

## 2013-10-06 MED ORDER — VENLAFAXINE HCL ER 150 MG PO CP24
150.0000 mg | ORAL_CAPSULE | Freq: Every day | ORAL | Status: DC
  Administered 2013-10-06: 150 mg via ORAL
  Filled 2013-10-06 (×2): qty 1

## 2013-10-06 MED ORDER — GUAIFENESIN 100 MG/5ML PO SOLN: 15.0000 mL | ORAL | Status: DC | PRN

## 2013-10-06 MED ORDER — POLYETHYLENE GLYCOL 3350 17 G PO PACK
17.0000 g | PACK | Freq: Every day | ORAL | Status: DC
  Administered 2013-10-06: 17 g via ORAL
  Filled 2013-10-06 (×2): qty 1

## 2013-10-06 MED ORDER — DIPHENHYDRAMINE HCL 25 MG PO CAPS
25.0000 mg | ORAL_CAPSULE | Freq: Four times a day (QID) | ORAL | Status: DC | PRN
  Administered 2013-10-06: 25 mg via ORAL
  Filled 2013-10-06: qty 1

## 2013-10-06 MED ORDER — LACTATED RINGERS IV SOLN
INTRAVENOUS | Status: DC
  Administered 2013-10-06 (×2): via INTRAVENOUS

## 2013-10-06 MED ORDER — SIMETHICONE 80 MG PO CHEW
80.0000 mg | CHEWABLE_TABLET | Freq: Four times a day (QID) | ORAL | Status: DC | PRN
  Filled 2013-10-06: qty 1

## 2013-10-06 MED ORDER — HYDROMORPHONE HCL 2 MG PO TABS: 2.0000 mg | ORAL_TABLET | Freq: Four times a day (QID) | ORAL | Status: DC | PRN

## 2013-10-06 MED ORDER — OXYCODONE-ACETAMINOPHEN 5-325 MG PO TABS: 1.0000 | ORAL_TABLET | ORAL | Status: DC | PRN

## 2013-10-06 MED ORDER — SODIUM CHLORIDE 0.9 % IJ SOLN: 9.0000 mL | INTRAMUSCULAR | Status: DC | PRN

## 2013-10-06 MED ORDER — PROPRANOLOL HCL ER 80 MG PO CP24
80.0000 mg | ORAL_CAPSULE | Freq: Every day | ORAL | Status: DC
  Administered 2013-10-06: 80 mg via ORAL
  Filled 2013-10-06 (×2): qty 1

## 2013-10-06 MED ORDER — DIPHENHYDRAMINE HCL 12.5 MG/5ML PO ELIX
12.5000 mg | ORAL_SOLUTION | Freq: Four times a day (QID) | ORAL | Status: DC | PRN
  Administered 2013-10-06: 12.5 mg via ORAL
  Filled 2013-10-06: qty 5

## 2013-10-06 MED ORDER — NAPROXEN 500 MG PO TABS
500.0000 mg | ORAL_TABLET | Freq: Two times a day (BID) | ORAL | Status: DC
  Administered 2013-10-06 (×2): 500 mg via ORAL
  Filled 2013-10-06 (×3): qty 1

## 2013-10-06 MED ORDER — NALOXONE HCL 0.4 MG/ML IJ SOLN: 0.4000 mg | INTRAMUSCULAR | Status: DC | PRN

## 2013-10-06 MED ORDER — ALUM & MAG HYDROXIDE-SIMETH 200-200-20 MG/5ML PO SUSP
30.0000 mL | ORAL | Status: DC | PRN
Start: 2013-10-06 — End: 2013-10-07

## 2013-10-06 MED ORDER — DIPHENHYDRAMINE HCL 50 MG/ML IJ SOLN: 12.5000 mg | Freq: Four times a day (QID) | INTRAMUSCULAR | Status: DC | PRN

## 2013-10-06 MED ORDER — ONDANSETRON HCL 4 MG PO TABS: 4.0000 mg | ORAL_TABLET | Freq: Four times a day (QID) | ORAL | Status: DC | PRN

## 2013-10-06 MED ORDER — PROMETHAZINE HCL 12.5 MG PO TABS: 12.5000 mg | ORAL_TABLET | Freq: Two times a day (BID) | ORAL | Status: DC | PRN

## 2013-10-06 MED ORDER — ONDANSETRON HCL 4 MG/2ML IJ SOLN
4.0000 mg | Freq: Four times a day (QID) | INTRAMUSCULAR | Status: DC | PRN
  Filled 2013-10-06: qty 2

## 2013-10-06 MED ORDER — HYDROXYZINE HCL 25 MG PO TABS
25.0000 mg | ORAL_TABLET | Freq: Every day | ORAL | Status: DC
  Administered 2013-10-06 (×2): 25 mg via ORAL
  Filled 2013-10-06 (×2): qty 1

## 2013-10-06 MED ORDER — FENTANYL 10 MCG/ML IV SOLN
INTRAVENOUS | Status: DC
  Administered 2013-10-06: 02:00:00 via INTRAVENOUS
  Administered 2013-10-06: 120 ug via INTRAVENOUS
  Filled 2013-10-06: qty 50

## 2013-10-06 MED ORDER — LURASIDONE HCL 40 MG PO TABS
40.0000 mg | ORAL_TABLET | Freq: Every day | ORAL | Status: DC
  Administered 2013-10-06: 40 mg via ORAL
  Filled 2013-10-06 (×2): qty 1

## 2013-10-06 MED ORDER — MENTHOL 3 MG MT LOZG
1.0000 | LOZENGE | OROMUCOSAL | Status: DC | PRN
  Filled 2013-10-06: qty 9

## 2013-10-06 MED ORDER — METHOCARBAMOL 750 MG PO TABS
750.0000 mg | ORAL_TABLET | Freq: Four times a day (QID) | ORAL | Status: DC
  Administered 2013-10-06 (×4): 750 mg via ORAL
  Filled 2013-10-06 (×6): qty 1

## 2013-10-06 NOTE — Progress Notes (Signed)
PCA Fentanyl discontinued per order.  49mcg wasted in sink with second RN. Leighton Roach, RN---------------

## 2013-10-06 NOTE — Progress Notes (Signed)
Subjective: Patient reports no problems voiding.  Pain somewhat relieved.  States still has "waves" of pain that are "terible"  Objective: I have reviewed patient's vital signs, intake and output and medications.  General: alert and no distress Resp: clear to auscultation bilaterally Cardio: regular rate and rhythm GI: soft, non-tender; bowel sounds normal; no masses,  no organomegaly Extremities: extremities normal, atraumatic, no cyanosis or edema  Uterus palpable in lower abdomen   Assessment/Plan: 28yo G0 with known large fibroids, pelvic pain Admitted for pain control - Now Fentanyl PCA, Dilaudid at home. Will add NSAIDs - Naproxen 500mg  bid  LOS: 1 day    Stuckert, Terrin Imparato Bovard 10/06/2013, 8:07 AM

## 2013-10-06 NOTE — Progress Notes (Signed)
Report called to [name] on [name]. Pt to [phone] via w/c

## 2013-10-06 NOTE — Progress Notes (Signed)
Patient ID: [name], female   DOB: 1985/08/11, 28 y.o.   MRN: [id number]  Pt's pain is to a 7-8/10, was 10+ when came in, baseline is 5.  D/w pt not going to make pain 100% better, but will try to make tolerable.  D/W pt oral pain meds work better as they work longer.  Take benadtyl with Dilaudid as makes her itch.  Will send with Phenergan at night to potentiate dilaudid.  Also will give Naproxen 500 mg twice daily, d/w pt ovarian cyst and irritating peritoneum, intensifies pain.    AFVSS gen sleepy  D/w pharmacy pain med requirements while in house, roughly consistent with dose as written for OP regimen, will increase number to 60.  Pt states feels can manage this large amount of pain medicine dafely.  Also d/w pt plan for upcoming surgery - feeling that hysterectomy will probably be best choice.  Understand it ends her chances at fertility, but knowing that her uterus would not likely be able to carry baby with scarring and large number of fibroids.  Pt voices understanding and wishes to proceed.    Plan for d/c this PM or in AM.  Will do trial of oral meds before discharge.    To keep [place] appt at office on Tuesday, so our tech can compare to her last scan.  Also add Miralax for likely consipation.    Pt also with nausea secondary to pain, phenergan to help prn

## 2013-10-06 NOTE — Discharge Summary (Addendum)
Physician Discharge Summary  Patient ID: [name] MRN: [id number] DOB/AGE: 06-Dec-1985 28 y.o.  Admit date: 10/05/2013 Discharge date: 10/06/2013  Admission Diagnoses: uncontrolled pelvic pain with known uterine fibroids, right ovarian cyst  Discharge Diagnoses: same as above Active Problems:   Leiomyoma of uterus   Discharged Condition: stable  Hospital Course: Admitted 4/5 for IV pain control of pelvic pain.  Known multiple, large uterine fibroids, R ovarian cyst.  Pain decreased from 10+ to 8, Converted to po pain meds.  Added Naproxen.  D/C when pain is tolerable after trial of po meds.  Add phenergan prn qhs to potentiate.  D/C with increased # of dilaudid (60 vs 30, for monthly pain contract) d/w pharmacy conversion for pain meds.  Miralax for GI motility.    Consults: None  Significant Diagnostic Studies: labs: CBC, UPT and radiology: Ultrasound: many fibroids, R ovarian cyst, no torsion  Treatments: analgesia: Dilaudid po and PCA, also po naproxen  Discharge Exam: Blood pressure 123/79, pulse 78, temperature 97.9 F (36.6 C), temperature source Oral, resp. rate 20, height 5\' 7"  (1.702 m), weight 102.515 kg (226 lb 0.1 oz), last menstrual period 09/29/2013, SpO2 98.00%. General appearance: no distress and sleepy Uterus palpable at umbilicus  Disposition: home  Discharge Orders   Future Appointments Provider Department Dept Phone   10/23/2013 10:15 AM Kandis Nab, MD [place] [phone]   Future Orders Complete By Expires   Call MD for:  severe uncontrolled pain  As directed    Diet - low sodium heart healthy  As directed    Discharge instructions  As directed    Comments:     Call [phone] with questions or problems  Be sure to keep your appointment 10/08/13 for [place]   Driving Restrictions  As directed    Comments:     While taking strong pain medicine   Increase activity slowly  As directed    May shower / Bathe  As directed    May  walk up steps  As directed        Medication List         albuterol 108 (90 BASE) MCG/ACT inhaler  Commonly known as:  PROVENTIL HFA;VENTOLIN HFA  Inhale 2 puffs into the lungs every 4 (four) hours as needed for wheezing or shortness of breath.     HYDROmorphone 2 MG tablet  Commonly known as:  DILAUDID  Take 1-2 tablets (2-4 mg total) by mouth every 6 (six) hours as needed for severe pain.     leuprolide 11.25 MG injection  Commonly known as:  LUPRON  Inject 11.25 mg into the muscle every 3 (three) months.     lidocaine 5 %  Commonly known as:  LIDODERM  Place 1 patch onto the skin daily. Remove & Discard patch within 12 hours or as directed by MD     lurasidone 40 MG Tabs tablet  Commonly known as:  LATUDA  Take 40 mg by mouth daily with breakfast.     methocarbamol 750 MG tablet  Commonly known as:  ROBAXIN  Take 1 tablet (750 mg total) by mouth 4 (four) times daily.     multivitamin with minerals Tabs tablet  Take 1 tablet by mouth daily.     naproxen 500 MG tablet  Commonly known as:  NAPROSYN  Take 1 tablet (500 mg total) by mouth 2 (two) times daily with a meal.     polyethylene glycol packet  Commonly known as:  MIRALAX / GLYCOLAX  Take 17 g by mouth daily.     promethazine 12.5 MG tablet  Commonly known as:  PHENERGAN  Take 1 tablet (12.5 mg total) by mouth 3 times/day as needed-between meals & bedtime for nausea or vomiting.     propranolol ER 80 MG 24 hr capsule  Commonly known as:  INDERAL LA  Take 80 mg by mouth daily.     SUMAtriptan 50 MG tablet  Commonly known as:  IMITREX  Take 50 mg by mouth every 2 (two) hours as needed for migraine or headache. May repeat in 2 hours if headache persists or recurs.     venlafaxine 100 MG tablet  Commonly known as:  EFFEXOR  Take 150 mg by mouth daily.           Follow-up Information   Follow up with Stuckert, Thornell Sartorius, MD. (Keep appointment for Ultrasound 10/08/13.  Keep scheduled appointments.)     Specialty:  Obstetrics and Gynecology   Contact information:   [address] [place] [address] [phone]       Signed: Janyth Contes 10/06/2013, 1:30 PM  Pt decided to stay overnight, d/c home on 10/07/13.

## 2013-10-06 NOTE — H&P (Signed)
[name] is an 28 y.o. female.G0 with known pelvic pain and large uterine Fibroids.  Going through eval for myomectomy vs hysterectomy.  Presents today with increased pelvic pain this am, increasing through the day.  States different/more severe than her usual pain.  Not helped with po dilaudid.  States pain increased upon awakening, worse through day.  Describes as constant and some worsening colicky pain.  + Nausea, no vomiting, decreased appetite, no D/C, no Fever/Chills.    Pertinent Gynecological History: Pt with h/o of "massive" myomectomy from 24-28 wk size uterus, necessitation 4u pRBCs. Recent [place] Lupron.  Having some menopausal sx's (hot flashes) this am with increased VB like menses, now pinkish d/c G0P0 No abn pap Remote h/o Chl  Menstrual History: Patient's last menstrual period was 09/29/2013.    Past Medical History  Diagnosis Date  . Allergy     seasonal allergies  . Pelvic cyst     Seen by gynecologist Dr. Harrington Challenger. Scheduled for ex lap on 12/02/11  . Pneumonia   . Depression   . Fibroids   . Asthma   Bipolar  Past Surgical History  Procedure Laterality Date  . No past surgeries    . Myomectomy  12/02/2011    Procedure: MYOMECTOMY;  Surgeon: Gus Height, MD;  Location: [facility];  Service: Gynecology;  Laterality: N/A;    Family History  Problem Relation Age of Onset  . Heart disease Mother   deceased 05-22-2023 DM, fibroids  Social History:  reports that she has never smoked. She has never used smokeless tobacco. She reports that she does not drink alcohol or use illicit drugs. single  Allergies:  Allergies  Allergen Reactions  . Dilaudid [Hydromorphone Hcl] Itching and Swelling  . Clindamycin/Lincomycin   . Penicillins Other (See Comments)    Unknown - childhood reaction    Prescriptions prior to admission  Medication Sig Dispense Refill  . albuterol (PROVENTIL HFA;VENTOLIN HFA) 108 (90 BASE) MCG/ACT inhaler Inhale 2 puffs into the lungs every 4  (four) hours as needed for wheezing or shortness of breath.      Marland Kitchen HYDROmorphone (DILAUDID) 2 MG tablet Take 2 mg by mouth every 4 (four) hours as needed for severe pain.      Marland Kitchen leuprolide (LUPRON) 11.25 MG injection Inject 11.25 mg into the muscle every 3 (three) months.      . lidocaine (LIDODERM) 5 % Place 1 patch onto the skin daily. Remove & Discard patch within 12 hours or as directed by MD  30 patch  1  . lurasidone (LATUDA) 40 MG TABS tablet Take 1 tablet (40 mg total) by mouth daily with breakfast.  30 tablet  1  . methocarbamol (ROBAXIN) 750 MG tablet Take 1 tablet (750 mg total) by mouth 4 (four) times daily.  120 tablet  1  . Multiple Vitamin (MULTIVITAMIN WITH MINERALS) TABS tablet Take 1 tablet by mouth daily.      . propranolol ER (INDERAL LA) 80 MG 24 hr capsule Take 80 mg by mouth daily.      . SUMAtriptan (IMITREX) 50 MG tablet Take 50 mg by mouth every 2 (two) hours as needed for migraine or headache. May repeat in 2 hours if headache persists or recurs.      . venlafaxine (EFFEXOR) 100 MG tablet Take 1.5 tablets (150 mg total) by mouth daily.  30 tablet  0  . naproxen (NAPROSYN) 500 MG tablet Take 500 mg by mouth 2 (two) times daily with a meal.  Review of Systems  Constitutional: Negative.   HENT: Negative.   Eyes: Negative.   Respiratory: Negative.   Cardiovascular: Negative.   Gastrointestinal: Positive for abdominal pain.       Relieved with pressure  Genitourinary: Negative.   Musculoskeletal: Negative.   Skin: Negative.   Neurological: Negative.   Psychiatric/Behavioral: Negative.     Blood pressure 141/88, pulse 73, temperature 98.5 F (36.9 C), resp. rate 20, last menstrual period 09/29/2013. Physical Exam  Constitutional: She is oriented to person, place, and time. She appears well-developed and well-nourished.  uncomfortable  HENT:  Head: Normocephalic and atraumatic.  Cardiovascular: Normal rate and regular rhythm.   Respiratory: Effort normal  and breath sounds normal. No respiratory distress. She has no wheezes.  GI: Soft. Bowel sounds are normal. She exhibits no distension. There is tenderness. There is no rebound.  Uterus palpable 1cm below umbilicus Scar from midline vertical incision  Musculoskeletal: Normal range of motion.  Neurological: She is alert and oriented to person, place, and time.  Skin: Skin is warm and dry.  Psychiatric: She has a normal mood and affect. Her behavior is normal.  very uncomfortable  Results for orders placed during the hospital encounter of 10/05/13 (from the past 24 hour(s))  CBC     Status: Abnormal   Collection Time    10/05/13 10:25 PM      Result Value Ref Range   WBC 8.6  4.0 - 10.5 K/uL   RBC 4.12  3.87 - 5.11 MIL/uL   Hemoglobin 13.5  12.0 - 15.0 g/dL   HCT 37.3  36.0 - 46.0 %   MCV 90.5  78.0 - 100.0 fL   MCH 32.8  26.0 - 34.0 pg   MCHC 36.2 (*) 30.0 - 36.0 g/dL   RDW 12.2  11.5 - 15.5 %   Platelets 334  150 - 400 K/uL  URINALYSIS, ROUTINE W REFLEX MICROSCOPIC     Status: Abnormal   Collection Time    10/05/13 11:33 PM      Result Value Ref Range   Color, Urine YELLOW  YELLOW   APPearance CLEAR  CLEAR   Specific Gravity, Urine 1.015  1.005 - 1.030   pH 7.0  5.0 - 8.0   Glucose, UA NEGATIVE  NEGATIVE mg/dL   Hgb urine dipstick LARGE (*) NEGATIVE   Bilirubin Urine NEGATIVE  NEGATIVE   Ketones, ur NEGATIVE  NEGATIVE mg/dL   Protein, ur NEGATIVE  NEGATIVE mg/dL   Urobilinogen, UA 0.2  0.0 - 1.0 mg/dL   Nitrite NEGATIVE  NEGATIVE   Leukocytes, UA NEGATIVE  NEGATIVE  URINE MICROSCOPIC-ADD ON     Status: Abnormal   Collection Time    10/05/13 11:33 PM      Result Value Ref Range   Squamous Epithelial / LPF FEW (*) RARE   WBC, UA 0-2  <3 WBC/hpf   RBC / HPF 0-2  <3 RBC/hpf   Bacteria, UA RARE  RARE   Urine-Other MUCOUS PRESENT    POCT PREGNANCY, URINE     Status: None   Collection Time    10/05/13 11:41 PM      Result Value Ref Range   Preg Test, Ur NEGATIVE  NEGATIVE     [place] at office at least 17 intramural fibroids 1cm-4cm, 4cm subserosal fibroid.  Assessment/Plan: 28yo G0P0 with pelvic pain, uterine fibroids Dilaudid IV and 4mg  morphine pain control in MAU with minimal improvement TVUS to evaluate uterus/ovaries - per tech multiple uterine fibroids, ?  Pedunculated x 2 at fundus, RLQ fibroid.  R ovarian cyst abutting, pain on scan, doesn't believe torsed.  Nl Left ovary, good flow Observation/admission, pain control with fentanyl PCA  Stuckert, Arvo Ealy Bovard 10/06/2013, 12:09 AM

## 2013-10-07 NOTE — Progress Notes (Signed)
Pt escorted out of hospital ambulatory with Audrie Lia, CNA. Pt stable at discharge.

## 2013-10-07 NOTE — Progress Notes (Signed)
Patient ID: [name], female   DOB: May 26, 1986, 26 y.o.   MRN: [id number]  Pt opted to stay overnight, Pain helped by PO meds  AFVSS gen NAD Abd soft, uterus palpable to umbilicus  D/C home, f/u in office tomorrow for [place] to evaluate fibroids Will plan for hysterectomy

## 2013-10-09 ENCOUNTER — Telehealth: Payer: Self-pay | Admitting: Family Medicine

## 2013-10-09 NOTE — Telephone Encounter (Signed)
Agree with patient getting phone note from OB/GYN doctor  Liam Graham, PGY-3 Family Medicine Resident

## 2013-10-09 NOTE — Telephone Encounter (Signed)
Pt wants to talk to dr before the letter is faxed back. There are some corrections needed

## 2013-10-09 NOTE — Telephone Encounter (Signed)
Pt is in hospital having surgery performed by Dr. Melba Coon.  Pt requesting Dr. Otis Dials to write a letter to her work stating that after surgery, pt will be able to return to full duty.  I told patient that letter would have to come from her OB/Gyn surgeon, not Dr. Otis Dials.  Pt verbalized understanding.   [place]

## 2013-10-14 ENCOUNTER — Encounter (HOSPITAL_COMMUNITY): Payer: Self-pay | Admitting: *Deleted

## 2013-10-14 ENCOUNTER — Inpatient Hospital Stay (HOSPITAL_COMMUNITY)
Admission: AD | Admit: 2013-10-14 | Discharge: 2013-10-14 | Disposition: A | Discharge status: Home / Self Care | Payer: 59 | Source: Ambulatory Visit | Attending: Obstetrics and Gynecology | Admitting: Obstetrics and Gynecology

## 2013-10-14 DIAGNOSIS — J45909 Unspecified asthma, uncomplicated: Secondary | ICD-10-CM | POA: Insufficient documentation

## 2013-10-14 DIAGNOSIS — D259 Leiomyoma of uterus, unspecified: Secondary | ICD-10-CM

## 2013-10-14 DIAGNOSIS — F329 Major depressive disorder, single episode, unspecified: Secondary | ICD-10-CM | POA: Insufficient documentation

## 2013-10-14 DIAGNOSIS — Z79899 Other long term (current) drug therapy: Secondary | ICD-10-CM | POA: Insufficient documentation

## 2013-10-14 DIAGNOSIS — F3289 Other specified depressive episodes: Secondary | ICD-10-CM | POA: Insufficient documentation

## 2013-10-14 DIAGNOSIS — G8929 Other chronic pain: Secondary | ICD-10-CM

## 2013-10-14 DIAGNOSIS — R109 Unspecified abdominal pain: Secondary | ICD-10-CM | POA: Insufficient documentation

## 2013-10-14 LAB — URINALYSIS, ROUTINE W REFLEX MICROSCOPIC
BILIRUBIN URINE: NEGATIVE
Glucose, UA: NEGATIVE mg/dL
Hgb urine dipstick: NEGATIVE
Ketones, ur: NEGATIVE mg/dL
Nitrite: NEGATIVE
PH: 6 (ref 5.0–8.0)
Protein, ur: NEGATIVE mg/dL
Urobilinogen, UA: 0.2 mg/dL (ref 0.0–1.0)

## 2013-10-14 LAB — URINE MICROSCOPIC-ADD ON

## 2013-10-14 NOTE — Discharge Instructions (Signed)

## 2013-10-14 NOTE — MAU Note (Signed)
Patient states she has been having lower abdominal pain for a while from fibroids. Denies bleeding, discharge. Has nausea due to the pain.

## 2013-10-14 NOTE — MAU Provider Note (Signed)
History     CSN: [id number]  Arrival date and time: 10/14/13 1112   First Provider Initiated Contact with Patient 10/14/13 1322      Chief Complaint  Patient presents with  . Abdominal Pain   HPI  Ms. Aseret Rickey Primus is a 28 y.o. female G0P0 who presents to MAU with worsening abdominal pain. Pt was admitted by Dr. Melba Coon on 10/06/13 for pain management related to multiple large uterine fibroids (see recent US scan). Pt was discharged home with PO dilaudid and instructed to call the office to discuss surgery options. The patient was seen in the office on 4/7 and had another [place] that showed once again and enlarged uterus and surgery was discussed, however no set plan was made per the patient.  Patient called Dr. Roe Rutherford office today and she was informed that Dr. Melba Coon would call her back to discuss surgery. Dr. Melba Coon returned the patients call and the patient missed her call. Dr. Jadene Pierini message states that she is seeing patients throughout the day. Patient says there was no further instructions left by Dr. Melba Coon. The patient assumed that if she came to MAU today that she could have her surgery today because she just cannot take it anymore. Pt has PO dilaudid at home and is taking it as prescribed. Pt is also taking naproxen as prescribed.    OB History   Grav Para Term Preterm Abortions TAB SAB Ect Mult Living   0               Past Medical History  Diagnosis Date  . Allergy     seasonal allergies  . Pelvic cyst     Seen by gynecologist Dr. Harrington Challenger. Scheduled for ex lap on 12/02/11  . Pneumonia   . Depression   . Fibroids   . Asthma     Past Surgical History  Procedure Laterality Date  . No past surgeries    . Myomectomy  12/02/2011    Procedure: MYOMECTOMY;  Surgeon: Gus Height, MD;  Location: [facility];  Service: Gynecology;  Laterality: N/A;    Family History  Problem Relation Age of Onset  . Heart disease Mother     History  Substance Use Topics  . Smoking status:  Never Smoker   . Smokeless tobacco: Never Used  . Alcohol Use: No    Allergies:  Allergies  Allergen Reactions  . Dilaudid [Hydromorphone Hcl] Itching and Swelling  . Clindamycin/Lincomycin   . Penicillins Other (See Comments)    Unknown - childhood reaction    Prescriptions prior to admission  Medication Sig Dispense Refill  . albuterol (PROVENTIL HFA;VENTOLIN HFA) 108 (90 BASE) MCG/ACT inhaler Inhale 2 puffs into the lungs every 4 (four) hours as needed for wheezing or shortness of breath.      Marland Kitchen HYDROmorphone (DILAUDID) 2 MG tablet Take 1-2 tablets (2-4 mg total) by mouth every 6 (six) hours as needed for severe pain.  60 tablet  0  . leuprolide (LUPRON) 11.25 MG injection Inject 11.25 mg into the muscle every 3 (three) months.      . lidocaine (LIDODERM) 5 % Place 1 patch onto the skin daily. Remove & Discard patch within 12 hours or as directed by MD  30 patch  1  . lurasidone (LATUDA) 40 MG TABS tablet Take 40 mg by mouth daily with breakfast.      . methocarbamol (ROBAXIN) 750 MG tablet Take 1 tablet (750 mg total) by mouth 4 (four) times daily.  120 tablet  1  . Multiple Vitamin (MULTIVITAMIN WITH MINERALS) TABS tablet Take 1 tablet by mouth daily.      . naproxen (NAPROSYN) 500 MG tablet Take 1 tablet (500 mg total) by mouth 2 (two) times daily with a meal.  60 tablet  1  . polyethylene glycol (MIRALAX / GLYCOLAX) packet Take 17 g by mouth daily.  30 packet  2  . promethazine (PHENERGAN) 12.5 MG tablet Take 1 tablet (12.5 mg total) by mouth 3 times/day as needed-between meals & bedtime for nausea or vomiting.  30 tablet  1  . propranolol ER (INDERAL LA) 80 MG 24 hr capsule Take 80 mg by mouth daily.      . SUMAtriptan (IMITREX) 50 MG tablet Take 50 mg by mouth every 2 (two) hours as needed for migraine or headache. May repeat in 2 hours if headache persists or recurs.      . venlafaxine (EFFEXOR) 100 MG tablet Take 150 mg by mouth daily.       Results for orders placed during  the hospital encounter of 10/14/13 (from the past 48 hour(s))  URINALYSIS, ROUTINE W REFLEX MICROSCOPIC     Status: Abnormal   Collection Time    10/14/13 11:30 AM      Result Value Ref Range   Color, Urine YELLOW  YELLOW   APPearance CLEAR  CLEAR   Specific Gravity, Urine >1.030 (*) 1.005 - 1.030   pH 6.0  5.0 - 8.0   Glucose, UA NEGATIVE  NEGATIVE mg/dL   Hgb urine dipstick NEGATIVE  NEGATIVE   Bilirubin Urine NEGATIVE  NEGATIVE   Ketones, ur NEGATIVE  NEGATIVE mg/dL   Protein, ur NEGATIVE  NEGATIVE mg/dL   Urobilinogen, UA 0.2  0.0 - 1.0 mg/dL   Nitrite NEGATIVE  NEGATIVE   Leukocytes, UA SMALL (*) NEGATIVE  URINE MICROSCOPIC-ADD ON     Status: Abnormal   Collection Time    10/14/13 11:30 AM      Result Value Ref Range   Squamous Epithelial / LPF MANY (*) RARE   WBC, UA 3-6  <3 WBC/hpf   RBC / HPF 0-2  <3 RBC/hpf    Review of Systems  Constitutional: Negative for fever and chills.  Gastrointestinal: Positive for nausea and abdominal pain. Negative for vomiting.   Physical Exam   Blood pressure 128/84, pulse 84, temperature 98.8 F (37.1 C), temperature source Oral, resp. rate 20, height 5\' 5"  (1.651 m), weight 103.329 kg (227 lb 12.8 oz), last menstrual period 09/29/2013, SpO2 98.00%.  Physical Exam  Constitutional: She is oriented to person, place, and time. She appears well-developed and well-nourished. She appears distressed.  HENT:  Head: Normocephalic.  Eyes: Pupils are equal, round, and reactive to light.  Neck: Neck supple.  Neurological: She is alert and oriented to person, place, and time.  Skin: She is not diaphoretic.  Psychiatric: Her speech is normal. Her mood appears anxious. Her affect is angry. She exhibits a depressed mood.  The patient is sitting up in the bed rocking back and fourth. Pt is tearful at times.     MAU Course  Procedures None  MDM Consulted with Dr. Willis Modena. Patient is being actively managed by Dr. Melba Coon in the office. Pt is  instructed to wait for Dr. Melba Coon to call her back and to schedule an office visit to discuss surgery options. Pt is to be discharged from MAU and encouraged to call the office.  Assessment and Plan   A:  1. Leiomyoma of uterus   2. Chronic pain    P:  Discharge home in stable condition Call the office today to schedule an office visit with Dr. Melba Coon Continue pain medication as ordered.   Darrelyn Hillock Jeffre Enriques 10/14/2013, 1:59 PM

## 2013-10-23 ENCOUNTER — Ambulatory Visit: Payer: 59 | Admitting: Family Medicine

## 2013-10-29 ENCOUNTER — Encounter (HOSPITAL_COMMUNITY): Payer: Self-pay | Admitting: Pharmacist

## 2013-11-05 ENCOUNTER — Other Ambulatory Visit: Payer: Self-pay | Admitting: Family Medicine

## 2013-11-06 ENCOUNTER — Other Ambulatory Visit: Payer: Self-pay | Admitting: Family Medicine

## 2013-11-06 ENCOUNTER — Telehealth: Payer: Self-pay | Admitting: *Deleted

## 2013-11-06 MED ORDER — VENLAFAXINE HCL 75 MG PO TABS: 150.0000 mg | ORAL_TABLET | Freq: Every day | ORAL | Status: DC

## 2013-11-06 NOTE — Telephone Encounter (Signed)
[facility] is calling back for more clarifications on the Effexor. Is this suppose to be twice daily at 150 mg each time? Please call them to let them know.jw

## 2013-11-06 NOTE — Progress Notes (Signed)
Sent new Rx for effexor 75mg  take 2 tablets daily.   Liam Graham, PGY-3 Family Medicine Resident

## 2013-11-06 NOTE — Telephone Encounter (Signed)
Received fax from [facility] regarding pt's Effexor.  Please verify dosage and directions.  Pt stated Effexor was changed to 150 mg daily.  A new Rx is needed and pt does not want to break tabs, can pt use 2 75 mg tabs?  Questions please call [phone] or fax new Rx [phone].  Current Rx is for 100 mg tab take 1 tab by mouth twice daily.   Derl Barrow, RN

## 2013-11-06 NOTE — Telephone Encounter (Signed)
Pharmacist called back and wants to confirm [facility] is it 2 tabs daily or 1 tab bid. Please call pharmacist to confirm. Thanks .Mauricia Area

## 2013-11-06 NOTE — Telephone Encounter (Signed)
Called pharmacy and left detailed message re: effexor:  EFFEXOR 75 MG # 60 SIG: TAKE 2 TABS BY MOUTH DAILY  2 REFILLS  Fwd to Dr.Losq for review. Mauricia Area

## 2013-11-07 NOTE — Telephone Encounter (Signed)
Called pharmacy and confirmed effexor 150mg  daily as immediate release since she is being weaned off of the medicine.   Liam Graham, PGY-3 Family Medicine Resident

## 2013-11-11 ENCOUNTER — Encounter (HOSPITAL_COMMUNITY): Payer: Self-pay

## 2013-11-11 ENCOUNTER — Encounter (HOSPITAL_COMMUNITY)
Admission: RE | Admit: 2013-11-11 | Discharge: 2013-11-11 | Disposition: A | Discharge status: Home / Self Care | Payer: 59 | Source: Ambulatory Visit | Attending: Obstetrics and Gynecology | Admitting: Obstetrics and Gynecology

## 2013-11-11 DIAGNOSIS — D251 Intramural leiomyoma of uterus: Secondary | ICD-10-CM

## 2013-11-11 HISTORY — DX: Personal history of other medical treatment: Z92.89

## 2013-11-11 HISTORY — DX: Bipolar disorder, unspecified: F31.9

## 2013-11-11 HISTORY — DX: Headache: R51

## 2013-11-11 LAB — CBC
HCT: 38.3 % (ref 36.0–46.0)
HEMOGLOBIN: 13.6 g/dL (ref 12.0–15.0)
MCH: 31.8 pg (ref 26.0–34.0)
MCHC: 35.5 g/dL (ref 30.0–36.0)
MCV: 89.5 fL (ref 78.0–100.0)
Platelets: 292 10*3/uL (ref 150–400)
RBC: 4.28 MIL/uL (ref 3.87–5.11)
RDW: 12 % (ref 11.5–15.5)
WBC: 6.2 10*3/uL (ref 4.0–10.5)

## 2013-11-11 LAB — COMPREHENSIVE METABOLIC PANEL
ALBUMIN: 3.8 g/dL (ref 3.5–5.2)
ALK PHOS: 85 U/L (ref 39–117)
ALT: 15 U/L (ref 0–35)
AST: 13 U/L (ref 0–37)
BUN: 10 mg/dL (ref 6–23)
CO2: 28 mEq/L (ref 19–32)
Calcium: 10 mg/dL (ref 8.4–10.5)
Chloride: 98 mEq/L (ref 96–112)
Creatinine, Ser: 0.76 mg/dL (ref 0.50–1.10)
GFR calc Af Amer: >90 mL/min (ref >90)
GFR calc non Af Amer: >90 mL/min (ref >90)
Glucose, Bld: 89 mg/dL (ref 70–99)
POTASSIUM: 3.9 meq/L (ref 3.7–5.3)
SODIUM: 139 meq/L (ref 137–147)
Total Bilirubin: 0.3 mg/dL (ref 0.3–1.2)
Total Protein: 7.7 g/dL (ref 6.0–8.3)

## 2013-11-11 LAB — TYPE AND SCREEN
ABO/RH(D): B POS
Antibody Screen: NEGATIVE

## 2013-11-11 NOTE — H&P (Signed)
[name] is an 28 y.o. female  G16 with multiple intramural fibroids, also dysmenorrhea and menorrhagia.  Has had massive myomectomy in the past, but worsening symptoms.  D/W pt r/b/a, including that hysterectomy might not remove pain symptoms.  Pt requires dilaudid for pain control.  D/W pt possibility of need for blood transfusion.     Pertinent Gynecological History: No abn pap Remote h/o Chl, ? Trich G0 Known multiple intramural fibroids - recent US >12 fibroids 1-5cm sized Recent treatment with Femara andthen Depot Lupron Menstrual History:  No LMP recorded.    Past Medical History  Diagnosis Date  . Allergy     seasonal allergies  . Pelvic cyst     Seen by gynecologist Dr. Harrington Challenger. Scheduled for ex lap on 12/02/11  . Depression   . Fibroids   . Headache(784.0)     per patient tx for HA  . Bipolar disorder   . Asthma   . History of blood transfusion 11/2011    [place]   galacrorrhea - nl PRL  Past Surgical History  Procedure Laterality Date  . Myomectomy  12/02/2011    Procedure: MYOMECTOMY;  Surgeon: Gus Height, MD;  Location: [facility];  Service: Gynecology;  Laterality: N/A;    Family History  Problem Relation Age of Onset  . Heart disease Mother   DM, fibroid  Social History:  reports that she has never smoked. She has never used smokeless tobacco. She reports that she does not drink alcohol or use illicit drugs.single  Allergies:  Allergies  Allergen Reactions  . Dilaudid [Hydromorphone Hcl] Itching    Pt is taking this currently  . Clindamycin/Lincomycin Itching and Swelling  . Penicillins Other (See Comments)    Unknown - childhood reaction; Ok to have Ancef for pre-op antibiotic per Dr Melba Coon    Meds: DepotLupron, Dilaudid, Atarax, Latuda, lidocaine patch, methocarbamol, naproxan, Propranolol ER 80mg , Effexor 100mg , Albuterol    Review of Systems  Constitutional: Negative.   HENT: Negative.   Eyes: Negative.   Respiratory: Negative.   Cardiovascular:  Negative.   Gastrointestinal: Positive for abdominal pain.  Genitourinary: Negative.   Musculoskeletal: Negative.   Skin: Negative.   Neurological: Negative.   Psychiatric/Behavioral: Negative.     There were no vitals taken for this visit. Physical Exam  Constitutional: She is oriented to person, place, and time. She appears well-developed and well-nourished.  HENT:  Head: Normocephalic and atraumatic.  Cardiovascular: Normal rate and regular rhythm.   Respiratory: Effort normal and breath sounds normal. No respiratory distress. She has no wheezes.  GI: Soft. Bowel sounds are normal. She exhibits no distension. There is tenderness.  Fibroids palpated through abdominal wall  Musculoskeletal: Normal range of motion.  Neurological: She is alert and oriented to person, place, and time.  Skin: Skin is warm and dry.  Psychiatric: She has a normal mood and affect. Her behavior is normal.    Results for orders placed during the hospital encounter of 11/11/13 (from the past 24 hour(s))  CBC     Status: None   Collection Time    11/11/13  2:25 PM      Result Value Ref Range   WBC 6.2  4.0 - 10.5 K/uL   RBC 4.28  3.87 - 5.11 MIL/uL   Hemoglobin 13.6  12.0 - 15.0 g/dL   HCT 38.3  36.0 - 46.0 %   MCV 89.5  78.0 - 100.0 fL   MCH 31.8  26.0 - 34.0 pg  MCHC 35.5  30.0 - 36.0 g/dL   RDW 12.0  11.5 - 15.5 %   Platelets 292  150 - 400 K/uL  COMPREHENSIVE METABOLIC PANEL     Status: None   Collection Time    11/11/13  2:25 PM      Result Value Ref Range   Sodium 139  137 - 147 mEq/L   Potassium 3.9  3.7 - 5.3 mEq/L   Chloride 98  96 - 112 mEq/L   CO2 28  19 - 32 mEq/L   Glucose, Bld 89  70 - 99 mg/dL   BUN 10  6 - 23 mg/dL   Creatinine, Ser 0.76  0.50 - 1.10 mg/dL   Calcium 10.0  8.4 - 10.5 mg/dL   Total Protein 7.7  6.0 - 8.3 g/dL   Albumin 3.8  3.5 - 5.2 g/dL   AST 13  0 - 37 U/L   ALT 15  0 - 35 U/L   Alkaline Phosphatase 85  39 - 117 U/L   Total Bilirubin 0.3  0.3 - 1.2 mg/dL    GFR calc non Af Amer >90  >90 mL/min   GFR calc Af Amer >90  >90 mL/min  TYPE AND SCREEN     Status: None   Collection Time    11/11/13  2:25 PM      Result Value Ref Range   ABO/RH(D) B POS     Antibody Screen NEG     Sample Expiration 11/14/2013      No results found.  Assessment/Plan: 28yo G0 with multiple symptomatic uterine fibroids for delinitive management.  Has had massive myomectomy in 11/2011 with return of sx's < 2 years later.  D/w pt, at length,fertility given current situation with fibroids.  Pt aware could have another myomectomy, but ready to be done with fibroids and pain and heavy VB. D/w pt r/b/a, ot TAH, B salpingectomy D/W pt risk of blood transfusion, starting HGb 13+  Amarissa Koerner Bovard-Stuckert 11/11/2013, 8:51 PM

## 2013-11-11 NOTE — Pre-Procedure Instructions (Signed)
SDS BB History Log given to Lab for patient's history of blood transfusion at [facility] in 11/2011.

## 2013-11-11 NOTE — Patient Instructions (Addendum)
   Your procedure is scheduled on:  Tuesday, May 12  Enter through the Main Entrance of [facility] at: [place] up the phone at the desk and dial [phone] and inform us of your arrival.  Please call this number if you have any problems the morning of surgery: [phone]  Remember: Do not eat or drink after midnight: Monday Take these medicines the morning of surgery with a SIP OF WATER: propranolol, effexor.  Bring albuterol inhaler with you on day of surgery.  Do not wear jewelry, make-up, or FINGER nail polish No metal in your hair or on your body. Do not wear lotions, powders, perfumes.  You may wear deodorant.  Do not bring valuables to the hospital. Contacts, dentures or bridgework may not be worn into surgery.  Leave suitcase in the car. After Surgery it may be brought to your room. For patients being admitted to the hospital, checkout time is 11:00am the day of discharge.   Home with firend [facility] cell [phone].

## 2013-11-12 ENCOUNTER — Encounter (HOSPITAL_COMMUNITY): Payer: Self-pay

## 2013-11-12 ENCOUNTER — Encounter (HOSPITAL_COMMUNITY)
Admission: RE | Disposition: A | Discharge status: Home / Self Care | Payer: Self-pay | Source: Ambulatory Visit | Attending: Obstetrics and Gynecology

## 2013-11-12 ENCOUNTER — Inpatient Hospital Stay (HOSPITAL_COMMUNITY)
Admission: RE | Admit: 2013-11-12 | Discharge: 2013-11-15 | DRG: 743 | Disposition: A | Discharge status: Home / Self Care | Payer: 59 | Source: Ambulatory Visit | Attending: Obstetrics and Gynecology | Admitting: Obstetrics and Gynecology

## 2013-11-12 ENCOUNTER — Inpatient Hospital Stay (HOSPITAL_COMMUNITY): Payer: 59 | Admitting: Certified Registered"

## 2013-11-12 ENCOUNTER — Encounter (HOSPITAL_COMMUNITY): Payer: 59 | Admitting: Certified Registered"

## 2013-11-12 DIAGNOSIS — D251 Intramural leiomyoma of uterus: Principal | ICD-10-CM

## 2013-11-12 DIAGNOSIS — N920 Excessive and frequent menstruation with regular cycle: Secondary | ICD-10-CM | POA: Diagnosis present

## 2013-11-12 DIAGNOSIS — Z90710 Acquired absence of both cervix and uterus: Secondary | ICD-10-CM

## 2013-11-12 DIAGNOSIS — Z6835 Body mass index (BMI) 35.0-35.9, adult: Secondary | ICD-10-CM

## 2013-11-12 DIAGNOSIS — R12 Heartburn: Secondary | ICD-10-CM | POA: Diagnosis present

## 2013-11-12 DIAGNOSIS — D252 Subserosal leiomyoma of uterus: Secondary | ICD-10-CM | POA: Diagnosis present

## 2013-11-12 DIAGNOSIS — N83209 Unspecified ovarian cyst, unspecified side: Secondary | ICD-10-CM | POA: Diagnosis present

## 2013-11-12 DIAGNOSIS — Z880 Allergy status to penicillin: Secondary | ICD-10-CM

## 2013-11-12 DIAGNOSIS — N946 Dysmenorrhea, unspecified: Secondary | ICD-10-CM | POA: Diagnosis present

## 2013-11-12 HISTORY — PX: ABDOMINAL HYSTERECTOMY: SHX81

## 2013-11-12 HISTORY — PX: SCAR REVISION: SHX5285

## 2013-11-12 LAB — CBC
HCT: 33.3 % — ABNORMAL LOW (ref 36.0–46.0)
HEMOGLOBIN: 11.6 g/dL — AB (ref 12.0–15.0)
MCH: 31.4 pg (ref 26.0–34.0)
MCHC: 34.8 g/dL (ref 30.0–36.0)
MCV: 90.2 fL (ref 78.0–100.0)
Platelets: 263 10*3/uL (ref 150–400)
RBC: 3.69 MIL/uL — ABNORMAL LOW (ref 3.87–5.11)
RDW: 12.2 % (ref 11.5–15.5)
WBC: 13.6 10*3/uL — ABNORMAL HIGH (ref 4.0–10.5)

## 2013-11-12 LAB — PREGNANCY, URINE: PREG TEST UR: NEGATIVE

## 2013-11-12 SURGERY — HYSTERECTOMY, ABDOMINAL
Anesthesia: General | Site: Abdomen

## 2013-11-12 MED ORDER — HYDROMORPHONE HCL PF 1 MG/ML IJ SOLN
0.2000 mg | INTRAMUSCULAR | Status: DC | PRN
  Administered 2013-11-13: 0.6 mg via INTRAVENOUS
  Filled 2013-11-12: qty 1

## 2013-11-12 MED ORDER — NEOSTIGMINE METHYLSULFATE 10 MG/10ML IV SOLN
INTRAVENOUS | Status: DC | PRN
  Administered 2013-11-12: 3 mg via INTRAVENOUS

## 2013-11-12 MED ORDER — LURASIDONE HCL 40 MG PO TABS
40.0000 mg | ORAL_TABLET | Freq: Every day | ORAL | Status: DC
  Administered 2013-11-13: 40 mg via ORAL
  Filled 2013-11-12 (×3): qty 1

## 2013-11-12 MED ORDER — FENTANYL CITRATE 0.05 MG/ML IJ SOLN
INTRAMUSCULAR | Status: AC
  Administered 2013-11-12: 50 ug via INTRAVENOUS
  Filled 2013-11-12: qty 2

## 2013-11-12 MED ORDER — CEFAZOLIN SODIUM-DEXTROSE 2-3 GM-% IV SOLR
INTRAVENOUS | Status: AC
  Filled 2013-11-12: qty 50

## 2013-11-12 MED ORDER — ONDANSETRON HCL 4 MG/2ML IJ SOLN
INTRAMUSCULAR | Status: AC
  Filled 2013-11-12: qty 2

## 2013-11-12 MED ORDER — IBUPROFEN 800 MG PO TABS
800.0000 mg | ORAL_TABLET | Freq: Three times a day (TID) | ORAL | Status: DC | PRN
  Administered 2013-11-13: 800 mg via ORAL
  Filled 2013-11-12: qty 1

## 2013-11-12 MED ORDER — HYDROMORPHONE HCL PF 1 MG/ML IJ SOLN
INTRAMUSCULAR | Status: AC
  Filled 2013-11-12: qty 1

## 2013-11-12 MED ORDER — NEOSTIGMINE METHYLSULFATE 10 MG/10ML IV SOLN
INTRAVENOUS | Status: AC
  Filled 2013-11-12: qty 1

## 2013-11-12 MED ORDER — ALUM & MAG HYDROXIDE-SIMETH 200-200-20 MG/5ML PO SUSP
30.0000 mL | ORAL | Status: DC | PRN
  Administered 2013-11-14: 30 mL via ORAL
  Filled 2013-11-12 (×2): qty 30

## 2013-11-12 MED ORDER — GLYCOPYRROLATE 0.2 MG/ML IJ SOLN
INTRAMUSCULAR | Status: AC
  Filled 2013-11-12: qty 3

## 2013-11-12 MED ORDER — PHENYLEPHRINE HCL 10 MG/ML IJ SOLN
INTRAMUSCULAR | Status: DC | PRN
  Administered 2013-11-12: 40 ug via INTRAVENOUS
  Administered 2013-11-12: 80 ug via INTRAVENOUS
  Administered 2013-11-12: 40 ug via INTRAVENOUS

## 2013-11-12 MED ORDER — ALBUTEROL SULFATE (2.5 MG/3ML) 0.083% IN NEBU: 3.0000 mL | INHALATION_SOLUTION | RESPIRATORY_TRACT | Status: DC | PRN

## 2013-11-12 MED ORDER — ONDANSETRON HCL 4 MG PO TABS
4.0000 mg | ORAL_TABLET | Freq: Four times a day (QID) | ORAL | Status: DC | PRN
  Administered 2013-11-14: 4 mg via ORAL
  Filled 2013-11-12: qty 1

## 2013-11-12 MED ORDER — PROPOFOL 10 MG/ML IV BOLUS
INTRAVENOUS | Status: DC | PRN
  Administered 2013-11-12: 200 mg via INTRAVENOUS

## 2013-11-12 MED ORDER — MIDAZOLAM HCL 2 MG/2ML IJ SOLN
INTRAMUSCULAR | Status: AC
  Filled 2013-11-12: qty 2

## 2013-11-12 MED ORDER — CEFAZOLIN SODIUM-DEXTROSE 2-3 GM-% IV SOLR
2.0000 g | INTRAVENOUS | Status: AC
  Administered 2013-11-12: 2 g via INTRAVENOUS

## 2013-11-12 MED ORDER — FENTANYL CITRATE 0.05 MG/ML IJ SOLN
INTRAMUSCULAR | Status: AC
  Filled 2013-11-12: qty 5

## 2013-11-12 MED ORDER — DIPHENHYDRAMINE HCL 50 MG/ML IJ SOLN
12.5000 mg | Freq: Four times a day (QID) | INTRAMUSCULAR | Status: DC | PRN
  Administered 2013-11-12: 12.5 mg via INTRAVENOUS
  Filled 2013-11-12: qty 1

## 2013-11-12 MED ORDER — ALBUTEROL SULFATE HFA 108 (90 BASE) MCG/ACT IN AERS
INHALATION_SPRAY | RESPIRATORY_TRACT | Status: DC
Start: 2013-11-12 — End: 2013-11-12
  Filled 2013-11-12: qty 6.7

## 2013-11-12 MED ORDER — SIMETHICONE 80 MG PO CHEW
80.0000 mg | CHEWABLE_TABLET | Freq: Four times a day (QID) | ORAL | Status: DC | PRN
  Administered 2013-11-13 (×4): 80 mg via ORAL
  Filled 2013-11-12 (×4): qty 1

## 2013-11-12 MED ORDER — FENTANYL CITRATE 0.05 MG/ML IJ SOLN
INTRAMUSCULAR | Status: DC | PRN
  Administered 2013-11-12 (×8): 50 ug via INTRAVENOUS

## 2013-11-12 MED ORDER — ROCURONIUM BROMIDE 100 MG/10ML IV SOLN
INTRAVENOUS | Status: DC | PRN
  Administered 2013-11-12: 40 mg via INTRAVENOUS
  Administered 2013-11-12 (×2): 5 mg via INTRAVENOUS
  Administered 2013-11-12: 10 mg via INTRAVENOUS

## 2013-11-12 MED ORDER — FENTANYL CITRATE 0.05 MG/ML IJ SOLN
25.0000 ug | INTRAMUSCULAR | Status: DC | PRN
  Administered 2013-11-12 (×2): 50 ug via INTRAVENOUS

## 2013-11-12 MED ORDER — METHOCARBAMOL 750 MG PO TABS
750.0000 mg | ORAL_TABLET | Freq: Four times a day (QID) | ORAL | Status: DC
  Administered 2013-11-12 – 2013-11-14 (×6): 750 mg via ORAL
  Filled 2013-11-12 (×13): qty 1

## 2013-11-12 MED ORDER — ONDANSETRON HCL 4 MG/2ML IJ SOLN: 4.0000 mg | Freq: Four times a day (QID) | INTRAMUSCULAR | Status: DC | PRN

## 2013-11-12 MED ORDER — ONDANSETRON HCL 4 MG/2ML IJ SOLN
INTRAMUSCULAR | Status: DC | PRN
  Administered 2013-11-12: 4 mg via INTRAVENOUS

## 2013-11-12 MED ORDER — KETOROLAC TROMETHAMINE 30 MG/ML IJ SOLN
INTRAMUSCULAR | Status: DC | PRN
  Administered 2013-11-12: 30 mg via INTRAVENOUS

## 2013-11-12 MED ORDER — LACTATED RINGERS IV SOLN
INTRAVENOUS | Status: DC
  Administered 2013-11-12 (×3): via INTRAVENOUS

## 2013-11-12 MED ORDER — HYDROMORPHONE HCL PF 1 MG/ML IJ SOLN
INTRAMUSCULAR | Status: DC | PRN
  Administered 2013-11-12: .5 mg via INTRAVENOUS
  Administered 2013-11-12: 0.5 mg via INTRAVENOUS

## 2013-11-12 MED ORDER — HYDROMORPHONE 0.3 MG/ML IV SOLN
INTRAVENOUS | Status: DC
  Administered 2013-11-12: 2.3 mg via INTRAVENOUS
  Administered 2013-11-12: 14:00:00 via INTRAVENOUS
  Administered 2013-11-13: 1.8 mg via INTRAVENOUS
  Administered 2013-11-13: 3.33 mg via INTRAVENOUS
  Administered 2013-11-13: 1.2 mg via INTRAVENOUS
  Filled 2013-11-12 (×2): qty 25

## 2013-11-12 MED ORDER — PROMETHAZINE HCL 25 MG PO TABS
12.5000 mg | ORAL_TABLET | Freq: Two times a day (BID) | ORAL | Status: DC | PRN
  Administered 2013-11-14: 12.5 mg via ORAL
  Filled 2013-11-12: qty 1

## 2013-11-12 MED ORDER — PROPRANOLOL HCL ER 80 MG PO CP24
80.0000 mg | ORAL_CAPSULE | Freq: Every day | ORAL | Status: DC
  Administered 2013-11-13: 80 mg via ORAL
  Filled 2013-11-12 (×3): qty 1

## 2013-11-12 MED ORDER — LACTATED RINGERS IV SOLN
INTRAVENOUS | Status: DC
  Administered 2013-11-12 – 2013-11-13 (×2): via INTRAVENOUS

## 2013-11-12 MED ORDER — LIDOCAINE HCL (CARDIAC) 20 MG/ML IV SOLN
INTRAVENOUS | Status: AC
  Filled 2013-11-12: qty 5

## 2013-11-12 MED ORDER — ALBUTEROL SULFATE HFA 108 (90 BASE) MCG/ACT IN AERS: 2.0000 | INHALATION_SPRAY | Freq: Once | RESPIRATORY_TRACT | Status: DC

## 2013-11-12 MED ORDER — VENLAFAXINE HCL 75 MG PO TABS
150.0000 mg | ORAL_TABLET | Freq: Every day | ORAL | Status: DC
  Administered 2013-11-13: 150 mg via ORAL
  Filled 2013-11-12 (×3): qty 2

## 2013-11-12 MED ORDER — GUAIFENESIN 100 MG/5ML PO SOLN: 15.0000 mL | ORAL | Status: DC | PRN

## 2013-11-12 MED ORDER — ROCURONIUM BROMIDE 100 MG/10ML IV SOLN
INTRAVENOUS | Status: AC
  Filled 2013-11-12: qty 1

## 2013-11-12 MED ORDER — DIPHENHYDRAMINE HCL 12.5 MG/5ML PO ELIX
12.5000 mg | ORAL_SOLUTION | Freq: Four times a day (QID) | ORAL | Status: DC | PRN
  Administered 2013-11-12: 12.5 mg via ORAL
  Filled 2013-11-12: qty 5

## 2013-11-12 MED ORDER — KETOROLAC TROMETHAMINE 30 MG/ML IJ SOLN
INTRAMUSCULAR | Status: AC
  Filled 2013-11-12: qty 1

## 2013-11-12 MED ORDER — LACTATED RINGERS IV SOLN
INTRAVENOUS | Status: DC
  Administered 2013-11-12: 07:00:00 via INTRAVENOUS

## 2013-11-12 MED ORDER — MENTHOL 3 MG MT LOZG: 1.0000 | LOZENGE | OROMUCOSAL | Status: DC | PRN

## 2013-11-12 MED ORDER — DEXAMETHASONE SODIUM PHOSPHATE 10 MG/ML IJ SOLN
INTRAMUSCULAR | Status: DC | PRN
  Administered 2013-11-12: 10 mg via INTRAVENOUS

## 2013-11-12 MED ORDER — HYDROMORPHONE HCL PF 1 MG/ML IJ SOLN
0.2500 mg | INTRAMUSCULAR | Status: DC | PRN
  Administered 2013-11-12: 0.5 mg via INTRAVENOUS

## 2013-11-12 MED ORDER — MIDAZOLAM HCL 2 MG/2ML IJ SOLN
INTRAMUSCULAR | Status: DC | PRN
  Administered 2013-11-12: 2 mg via INTRAVENOUS

## 2013-11-12 MED ORDER — SODIUM CHLORIDE 0.9 % IJ SOLN: 9.0000 mL | INTRAMUSCULAR | Status: DC | PRN

## 2013-11-12 MED ORDER — DEXAMETHASONE SODIUM PHOSPHATE 10 MG/ML IJ SOLN
INTRAMUSCULAR | Status: AC
  Filled 2013-11-12: qty 1

## 2013-11-12 MED ORDER — LIDOCAINE HCL (CARDIAC) 20 MG/ML IV SOLN
INTRAVENOUS | Status: DC | PRN
  Administered 2013-11-12: 80 mg via INTRAVENOUS

## 2013-11-12 MED ORDER — GLYCOPYRROLATE 0.2 MG/ML IJ SOLN
INTRAMUSCULAR | Status: DC | PRN
  Administered 2013-11-12: 0.2 mg via INTRAVENOUS
  Administered 2013-11-12: .5 mg via INTRAVENOUS
  Administered 2013-11-12: 0.2 mg via INTRAVENOUS

## 2013-11-12 MED ORDER — PROPOFOL 10 MG/ML IV EMUL
INTRAVENOUS | Status: AC
  Filled 2013-11-12: qty 20

## 2013-11-12 MED ORDER — POLYETHYLENE GLYCOL 3350 17 G PO PACK
17.0000 g | PACK | Freq: Every day | ORAL | Status: DC
  Administered 2013-11-13: 17 g via ORAL
  Filled 2013-11-12 (×4): qty 1

## 2013-11-12 MED ORDER — NALOXONE HCL 0.4 MG/ML IJ SOLN: 0.4000 mg | INTRAMUSCULAR | Status: DC | PRN

## 2013-11-12 SURGICAL SUPPLY — 44 items
APL SKNCLS STERI-STRIP NONHPOA (GAUZE/BANDAGES/DRESSINGS) ×1
BENZOIN TINCTURE PRP APPL 2/3 (GAUZE/BANDAGES/DRESSINGS) ×1 IMPLANT
BINDER ABD UNIV 10 28-50 (GAUZE/BANDAGES/DRESSINGS) IMPLANT
BINDER ABDOM UNIV 10 (GAUZE/BANDAGES/DRESSINGS) ×2
CANISTER SUCT 3000ML (MISCELLANEOUS) ×2 IMPLANT
CHLORAPREP W/TINT 26ML (MISCELLANEOUS) ×2 IMPLANT
CLOTH BEACON ORANGE TIMEOUT ST (SAFETY) ×2 IMPLANT
CONT PATH 16OZ SNAP LID 3702 (MISCELLANEOUS) ×3 IMPLANT
DECANTER SPIKE VIAL GLASS SM (MISCELLANEOUS) IMPLANT
DRAPE WARM FLUID 44X44 (DRAPE) ×1 IMPLANT
DRSG OPSITE POSTOP 4X10 (GAUZE/BANDAGES/DRESSINGS) ×1 IMPLANT
GAUZE SPONGE 4X4 16PLY XRAY LF (GAUZE/BANDAGES/DRESSINGS) ×1 IMPLANT
GLOVE BIO SURGEON STRL SZ 6.5 (GLOVE) ×2 IMPLANT
GLOVE BIO SURGEON STRL SZ7 (GLOVE) ×2 IMPLANT
GOWN STRL REUS W/TWL LRG LVL3 (GOWN DISPOSABLE) ×6 IMPLANT
NDL HYPO 25X1 1.5 SAFETY (NEEDLE) IMPLANT
NEEDLE HYPO 25X1 1.5 SAFETY (NEEDLE) IMPLANT
NS IRRIG 1000ML POUR BTL (IV SOLUTION) ×2 IMPLANT
PACK ABDOMINAL GYN (CUSTOM PROCEDURE TRAY) ×1 IMPLANT
PAD OB MATERNITY 4.3X12.25 (PERSONAL CARE ITEMS) ×1 IMPLANT
PROTECTOR NERVE ULNAR (MISCELLANEOUS) ×2 IMPLANT
RETRACTOR WND ALEXIS 25 LRG (MISCELLANEOUS) ×1 IMPLANT
RTRCTR WOUND ALEXIS 25CM LRG (MISCELLANEOUS) ×2
SPONGE LAP 18X18 X RAY DECT (DISPOSABLE) ×4 IMPLANT
STAPLER VISISTAT 35W (STAPLE) ×2 IMPLANT
STRIP CLOSURE SKIN 1/2X4 (GAUZE/BANDAGES/DRESSINGS) ×1 IMPLANT
SUT PDS AB 0 CTX 60 (SUTURE) ×2 IMPLANT
SUT PLAIN 2 0 XLH (SUTURE) ×1 IMPLANT
SUT VIC AB 0 CT1 27 (SUTURE)
SUT VIC AB 0 CT1 27XBRD ANBCTR (SUTURE) IMPLANT
SUT VIC AB 1 CT1 18XBRD ANBCTR (SUTURE) ×3 IMPLANT
SUT VIC AB 1 CT1 8-18 (SUTURE) ×6
SUT VIC AB 2-0 CT1 27 (SUTURE) ×2
SUT VIC AB 2-0 CT1 TAPERPNT 27 (SUTURE) IMPLANT
SUT VIC AB 3-0 CT1 27 (SUTURE) ×6
SUT VIC AB 3-0 CT1 TAPERPNT 27 (SUTURE) IMPLANT
SUT VIC AB 3-0 SH 27 (SUTURE) ×2
SUT VIC AB 3-0 SH 27X BRD (SUTURE) IMPLANT
SUT VIC AB 4-0 KS 27 (SUTURE) ×3 IMPLANT
SUT VICRYL 0 TIES 12 18 (SUTURE) ×2 IMPLANT
SYR CONTROL 10ML LL (SYRINGE) IMPLANT
TOWEL OR 17X24 6PK STRL BLUE (TOWEL DISPOSABLE) ×4 IMPLANT
TRAY FOLEY CATH 14FR (SET/KITS/TRAYS/PACK) ×2 IMPLANT
WATER STERILE IRR 1000ML POUR (IV SOLUTION) ×2 IMPLANT

## 2013-11-12 NOTE — Anesthesia Postprocedure Evaluation (Signed)
  Anesthesia Post-op Note  Patient: [name]  Procedure(s) Performed: Procedure(s): Abdominal Hysterectomy with bilateral salpingectomy, and Right oophorectomy (N/A) SCAR REVISION (N/A)  Patient Location: Women's Unit  Anesthesia Type:General  Level of Consciousness: awake  Airway and Oxygen Therapy: Patient Spontanous Breathing  Post-op Pain: mild  Post-op Assessment: Patient's Cardiovascular Status Stable and Respiratory Function Stable  Post-op Vital Signs: stable  Last Vitals:  Filed Vitals:   11/12/13 1341  BP: 150/83  Pulse: 80  Temp: 36.7 C  Resp: 15    Complications: No apparent anesthesia complications

## 2013-11-12 NOTE — Interval H&P Note (Signed)
History and Physical Interval Note:  11/12/2013 7:49 AM  [place]  has presented today for surgery, with the diagnosis of 18-20 wk size uterine fibroid, 58[address]  The various methods of treatment have been discussed with the patient and family. After consideration of risks, benefits and other options for treatment, the patient has consented to  Procedure(s) with comments: HYSTERECTOMY ABDOMINAL (N/A) - 2 1/2hrs OR time as a surgical intervention .  The patient's history has been reviewed, patient examined, no change in status, stable for surgery.  I have reviewed the patient's chart and labs.  Questions were answered to the patient's satisfaction.     [name]

## 2013-11-12 NOTE — Addendum Note (Signed)
Addendum created 11/12/13 1421 by Ignacia Bayley, CRNA   Modules edited: Notes Section   Notes Section:  File: [id number]

## 2013-11-12 NOTE — Anesthesia Preprocedure Evaluation (Signed)
Anesthesia Evaluation  Patient identified by MRN, date of birth, ID band Patient awake    Reviewed: Allergy & Precautions, H&P , NPO status , Patient's Chart, lab work & pertinent test results, reviewed documented beta blocker date and time   Airway Mallampati: II TM Distance: >3 FB Neck ROM: full    Dental no notable dental hx. (+) Teeth Intact   Pulmonary asthma ,  breath sounds clear to auscultation  Pulmonary exam normal       Cardiovascular negative cardio ROS  Rhythm:regular Rate:Normal     Neuro/Psych PSYCHIATRIC DISORDERS    GI/Hepatic negative GI ROS,   Endo/Other  negative endocrine ROSMorbid obesity  Renal/GU negative Renal ROS     Musculoskeletal   Abdominal Normal abdominal exam  (+)   Peds  Hematology negative hematology ROS (+)   Anesthesia Other Findings   Reproductive/Obstetrics                           Anesthesia Physical  Anesthesia Plan  ASA: II  Anesthesia Plan: General   Post-op Pain Management:    Induction: Intravenous  Airway Management Planned: Oral ETT  Additional Equipment:   Intra-op Plan:   Post-operative Plan: Extubation in OR  Informed Consent: I have reviewed the patients History and Physical, chart, labs and discussed the procedure including the risks, benefits and alternatives for the proposed anesthesia with the patient or authorized representative who has indicated his/her understanding and acceptance.   Dental Advisory Given and Dental advisory given  Plan Discussed with: CRNA and Surgeon  Anesthesia Plan Comments: (  Discussed general anesthesia, including possible nausea, instrumentation of airway, sore throat,pulmonary aspiration, etc. I asked if the were any outstanding questions, or  concerns before we proceeded. )       Anesthesia Quick Evaluation

## 2013-11-12 NOTE — Transfer of Care (Signed)
Immediate Anesthesia Transfer of Care Note  Patient: [name]  Procedure(s) Performed: Procedure(s): Abdominal Hysterectomy with bilateral salpingectomy, and Right oophorectomy (N/A) SCAR REVISION (N/A)  Patient Location: PACU  Anesthesia Type:General  Level of Consciousness: awake, alert  and oriented  Airway & Oxygen Therapy: Patient Spontanous Breathing and Patient connected to nasal cannula oxygen  Post-op Assessment: Report given to PACU RN, Post -op Vital signs reviewed and stable and Patient moving all extremities  Post vital signs: Reviewed and stable  Complications: No apparent anesthesia complications

## 2013-11-12 NOTE — Anesthesia Postprocedure Evaluation (Signed)
  Anesthesia Post-op Note  Patient: [name]  Procedure(s) Performed: Procedure(s): Abdominal Hysterectomy with bilateral salpingectomy, and Right oophorectomy (N/A) SCAR REVISION (N/A) Patient is awake and responsive. Pain and nausea are reasonably well controlled. Vital signs are stable and clinically acceptable. Oxygen saturation is clinically acceptable. There are no apparent anesthetic complications at this time. Patient is ready for discharge.

## 2013-11-12 NOTE — H&P (View-Only) (Signed)
Sent new Rx for effexor 75mg take 2 tablets daily.   Jerell Demery, PGY-3 Family Medicine Resident  

## 2013-11-12 NOTE — Brief Op Note (Signed)
11/12/2013  11:58 AM  PATIENT:  [name]  28 y.o. female  PRE-OPERATIVE DIAGNOSIS:  12-14 wk size uterine fibroid, 58150, dysmenorrhea, menorrhagia  POST-OPERATIVE DIAGNOSIS:  12-14 wk size uterine fibroid, 58150, dysmenorrhea, menorrhagia.  Right ovarian cyst/peritubal  cyst  PROCEDURE:  Procedure(s): Abdominal Hysterectomy with bilateral salpingectomy, and Right oophorectomy (N/A) SCAR REVISION (N/A)  SURGEON:  Surgeon(s) and Role:    * Janyth Contes, MD - Primary    * Melina Schools, MD - Assisting  ANESTHESIA:   general  FINDINGS: multiple fibroids, fundal pedunculated; nl left ube.  R tube with peritubal cyst, R ovary; omental- uterine adhesions.  EBL:  Total I/O In: 2800 [I.V.:2800] Out: 1100 [Urine:400; Blood:700]  BLOOD ADMINISTERED:none  DRAINS: Urinary Catheter (Foley)   LOCAL MEDICATIONS USED:  NONE  SPECIMEN:  Source of Specimen:  uterus and cervix, B fallopian tubes and R ovary  DISPOSITION OF SPECIMEN:  PATHOLOGY  COUNTS:  YES  TOURNIQUET:  * No tourniquets in log *  DICTATION: .Other Dictation: Dictation Number not written   PLAN OF CARE: Admit to inpatient   PATIENT DISPOSITION:  PACU - hemodynamically stable.   Delay start of Pharmacological VTE agent (>24hrs) due to surgical blood loss or risk of bleeding: not applicable

## 2013-11-12 NOTE — Progress Notes (Signed)
Day of Surgery Procedure(s) (LRB): Abdominal Hysterectomy with bilateral salpingectomy, and Right oophorectomy (N/A) SCAR REVISION (N/A)  Subjective: Patient reports incisional pain and tolerating PO She is requesting a regular diet  Objective: I have reviewed patient's vital signs and intake and output.  General: alert and cooperative GI: soft NT Incision C/D/I  Assessment: s/p Procedure(s): Abdominal Hysterectomy with bilateral salpingectomy, and Right oophorectomy (N/A) SCAR REVISION (N/A): stable and progressing well  Plan: Advance diet per pt request Continue PCA for pain   LOS: 0 days    Logan Bores 11/12/2013, 6:15 PM

## 2013-11-13 ENCOUNTER — Encounter (HOSPITAL_COMMUNITY): Payer: Self-pay | Admitting: Obstetrics and Gynecology

## 2013-11-13 LAB — CBC
HCT: 30.7 % — ABNORMAL LOW (ref 36.0–46.0)
Hemoglobin: 11 g/dL — ABNORMAL LOW (ref 12.0–15.0)
MCH: 32.5 pg (ref 26.0–34.0)
MCHC: 35.8 g/dL (ref 30.0–36.0)
MCV: 90.8 fL (ref 78.0–100.0)
PLATELETS: 265 10*3/uL (ref 150–400)
RBC: 3.38 MIL/uL — ABNORMAL LOW (ref 3.87–5.11)
RDW: 12.2 % (ref 11.5–15.5)
WBC: 11.6 10*3/uL — ABNORMAL HIGH (ref 4.0–10.5)

## 2013-11-13 LAB — BASIC METABOLIC PANEL
BUN: 6 mg/dL (ref 6–23)
CALCIUM: 9.6 mg/dL (ref 8.4–10.5)
CO2: 29 mEq/L (ref 19–32)
CREATININE: 0.63 mg/dL (ref 0.50–1.10)
Chloride: 98 mEq/L (ref 96–112)
GFR calc Af Amer: >90 mL/min (ref >90)
GLUCOSE: 120 mg/dL — AB (ref 70–99)
Potassium: 4.4 mEq/L (ref 3.7–5.3)
Sodium: 137 mEq/L (ref 137–147)

## 2013-11-13 LAB — URINE CULTURE
Colony Count: NO GROWTH
Culture: NO GROWTH

## 2013-11-13 MED ORDER — HYDROMORPHONE HCL 2 MG PO TABS
2.0000 mg | ORAL_TABLET | ORAL | Status: DC | PRN
  Administered 2013-11-13 – 2013-11-15 (×6): 2 mg via ORAL
  Filled 2013-11-13: qty 2
  Filled 2013-11-13 (×5): qty 1

## 2013-11-13 MED ORDER — IBUPROFEN 800 MG PO TABS
800.0000 mg | ORAL_TABLET | Freq: Three times a day (TID) | ORAL | Status: DC
  Administered 2013-11-13 – 2013-11-15 (×5): 800 mg via ORAL
  Filled 2013-11-13 (×5): qty 1

## 2013-11-13 MED ORDER — HYDROMORPHONE HCL PF 1 MG/ML IJ SOLN: 1.0000 mg | INTRAMUSCULAR | Status: DC | PRN

## 2013-11-13 MED ORDER — HYDROMORPHONE HCL PF 1 MG/ML IJ SOLN
1.0000 mg | Freq: Once | INTRAMUSCULAR | Status: AC
  Administered 2013-11-13: 1 mg via INTRAVENOUS
  Filled 2013-11-13: qty 1

## 2013-11-13 MED ORDER — HYDROMORPHONE HCL 2 MG PO TABS
2.0000 mg | ORAL_TABLET | ORAL | Status: DC | PRN
  Administered 2013-11-13 (×3): 2 mg via ORAL
  Filled 2013-11-13 (×3): qty 1

## 2013-11-13 MED ORDER — HYDROXYZINE HCL 25 MG PO TABS
25.0000 mg | ORAL_TABLET | Freq: Four times a day (QID) | ORAL | Status: DC
  Administered 2013-11-13 – 2013-11-15 (×6): 25 mg via ORAL
  Filled 2013-11-13 (×10): qty 1

## 2013-11-13 NOTE — Op Note (Signed)
[name], [name]              ACCOUNT NO.:  [id number]  MEDICAL RECORD NO.:  [id number]  LOCATION:  4782                          FACILITY:  [facility]  PHYSICIAN:  Thornell Sartorius, MD        DATE OF BIRTH:  09/20/1985  DATE OF PROCEDURE:  11/12/2013 DATE OF DISCHARGE:                              OPERATIVE REPORT   PREOPERATIVE DIAGNOSES:  A 12- to 14-week size uterus with multiple fibroids, dysmenorrhea, menorrhagia.  POSTOPERATIVE DIAGNOSES:  A 12- to 14-week size uterus with multiple fibroids, dysmenorrhea, menorrhagia, right ovarian paratubal cyst.  PROCEDURE:  Total abdominal hysterectomy with bilateral salpingectomy and right oophorectomy, scar revision.  SURGEON:  Thornell Sartorius, MD.  ASSISTANTLucille Trevino. [name], M.D.  ANESTHESIA:  General.  FINDINGS:  Multiple fibroids with a fundal pedunculated fibroid approximately 5 cm.  Normal left tube.  Right tube with paratubal cyst. Normal right ovary.  Omental uterine adhesions.  Also normal left ovary.  IV FLUIDS:  2800.  URINE OUTPUT:  400 mL of clear urine at the end of procedure.  EBL:  Approximately 700 mL.  COMPLICATIONS:  None.  PATHOLOGY:  Uterus and cervix, bilateral fallopian tubes, right ovary, piece of omentum.  PROCEDURE:  After informed consent was reviewed with the patient and her family, she was transported to the OR, placed on the table in supine position.  Prepped and draped in a normal sterile fashion.  A midline vertical incision was made at the level of her previous incision, carried through to the underlying layer of fascia sharply.  The fascia was incised in the midline and the midline of the rectus muscle was identified and the peritoneum was entered after tenting it up with hemostats.  The incision was extended superiorly and inferiorly with good visualization of the bladder.  The Alexis skin retractor was placed carefully making sure no bowel was entrapped.  At this time, some omental to anterior  uterus adhesions were noted.  These adhesions were reduced with blunt dissection as well as Bovie cautery as well as clamping and cutting and ligating.  The uterus was removed.  On the left side, the tube and right round ligament were identified and ligated. Attention was then turned to the right side, and the fundal fibroid was grasped and using a single-tooth tenaculum, it was elevated, adhesions were ligated and exposed the round ligament and tube.  This was ligated and the tube was unable to be followed up to the fimbriated end.  The thought was to identify adnexal anatomy after removing of the uterus.  In a stepwise fashion, the cardinal ligaments were reduced.  The utero- ovarian ligaments were doubly ligated.  The bladder flap was created. The cervix, in a stepwise fashion, was excised from the surrounding tissue.  The ureters were both identified.  The uterosacral ligaments were ligated and held.  After the uterus had been incised, the cuff was reapproximated with several figure-of-eight 0 Vicryl.  Hemostasis assured with Bovie cautery and several sutures of 3-0 where the peritoneum had been interrupted.  After removal of the uterus, the right tube was noted to be in the cul-de-sac and removed, it was noted to be swollen with  likely paratubal cyst.  This was excised, ovary was involved and was also excised.  This was noted to be hemostatic. Copious irrigation was performed.  The peritoneum was reapproximated with 2-0 Vicryl.  The fascia was reapproximated using #0 PDS from either end overlapping in the midline.  The previous scar was removed.  The dead space was closed with plain gut, and after the scar was removed, the skin was undermined.  The dead space was again closed with 3-0 Vicryl with interrupted figure-of-eight.  The skin was approximated with 4-0 Vicryl as was the subcuticular closure.  Several interrupted areas were re-closed with 4-0 Vicryl in a subcu fashion.  Benzoin  and Steri- Strips were applied.  The patient tolerated the procedure well.  Sponge, lap, and needle counts were correct x2.  The patient was awakened and taken in stable condition to PACU.     Thornell Sartorius, MD     JB/MEDQ  D:  11/12/2013  T:  11/13/2013  Job:  [id number]

## 2013-11-13 NOTE — Progress Notes (Signed)
1 Day Post-Op Procedure(s) (LRB): Abdominal Hysterectomy with bilateral salpingectomy, and Right oophorectomy (N/A) SCAR REVISION (N/A)  Subjective: Patient reports incisional pain and tolerating PO.  Pain controlled.    Objective: I have reviewed patient's vital signs, intake and output and labs.  General: alert and no distress Resp: clear to auscultation bilaterally Cardio: regular rate and rhythm GI: soft, non-tender; bowel sounds normal; no masses,  no organomegaly and incision: clean, dry and intact Extremities: extremities normal, atraumatic, no cyanosis or edema Vaginal Bleeding: minimal  Assessment: s/p Procedure(s): Abdominal Hysterectomy with bilateral salpingectomy, and Right oophorectomy (N/A) SCAR REVISION (N/A): stable and progressing well  Plan: Advance diet Encourage ambulation Advance to PO medication Discontinue IV fluids Likely d/c tomorrow.  D/W pharmacy pain management.  Hydroxyzine 25mg  po q 6 hr, Scheduled Motrin 800mg , Dilaudid 2mg  q 2 hr prn.    LOS: 1 day    Tangala Wiegert Bovard-Stuckert 11/13/2013, 7:40 AM

## 2013-11-14 MED ORDER — ALUM & MAG HYDROXIDE-SIMETH 200-200-20 MG/5ML PO SUSP: 30.0000 mL | ORAL | Status: DC | PRN

## 2013-11-14 MED ORDER — CITRIC ACID-SODIUM CITRATE 334-500 MG/5ML PO SOLN
30.0000 mL | Freq: Once | ORAL | Status: AC
  Administered 2013-11-14: 30 mL via ORAL
  Filled 2013-11-14: qty 15

## 2013-11-14 NOTE — Progress Notes (Signed)
2 Days Post-Op Procedure(s) (LRB): Abdominal Hysterectomy with bilateral salpingectomy, and Right oophorectomy (N/A) SCAR REVISION (N/A)  Subjective: Patient reports vomiting and incisional pain.  One episode this AM, c/o heartburn  Objective: I have reviewed patient's vital signs, intake and output, medications, labs and pathology.  General: alert and no distress Resp: clear to auscultation bilaterally Cardio: regular rate and rhythm GI: soft, non-tender; bowel sounds normal; no masses,  no organomegaly Extremities: extremities normal, atraumatic, no cyanosis or edema Inc C/D/I  Assessment: s/p Procedure(s): Abdominal Hysterectomy with bilateral salpingectomy, and Right oophorectomy (N/A) SCAR REVISION (N/A): stable and slow progress, slow return of bowel function.  Encouraged slowing down on pain meds and ambulation.  Will reassess thi PM  Plan: Encourage ambulation.  Reassess this PM for poss d/c.  Na Citrate  LOS: 2 days    Caraline Deutschman Bovard-Stuckert 11/14/2013, 11:29 AM

## 2013-11-14 NOTE — Progress Notes (Signed)
Patient ID: [name], female   DOB: 11/15/1985, 28 y.o.   MRN: [id number]  Pt states severe heartburn.  Incisional pain reasonable with much less pain meds.  No flatus, ambulating.  AFVSS  gen NAD Abd soft, app tender, ND, + quiet bs Inc C/D/I  28yo s/p TAH POD #2 doing reasonably well Encourage ambulation Maalox for heartburn Cont close monitoring

## 2013-11-15 MED ORDER — NAPROXEN 500 MG PO TABS: 500.0000 mg | ORAL_TABLET | Freq: Two times a day (BID) | ORAL | Status: DC | PRN

## 2013-11-15 MED ORDER — HYDROMORPHONE HCL 2 MG PO TABS: 2.0000 mg | ORAL_TABLET | Freq: Four times a day (QID) | ORAL | Status: DC | PRN

## 2013-11-15 MED ORDER — HYDROXYZINE HCL 25 MG PO TABS: 25.0000 mg | ORAL_TABLET | Freq: Four times a day (QID) | ORAL | Status: DC | PRN

## 2013-11-15 NOTE — Progress Notes (Signed)
3 Days Post-Op Procedure(s) (LRB): Abdominal Hysterectomy with bilateral salpingectomy, and Right oophorectomy (N/A) SCAR REVISION (N/A)  Subjective: Patient reports nausea, incisional pain and tolerating PO.    Objective: I have reviewed patient's vital signs, intake and output, medications and labs.  General: alert and no distress Resp: clear to auscultation bilaterally Cardio: regular rate and rhythm GI: soft, non-tender; bowel sounds normal; no masses,  no organomegaly Extremities: extremities normal, atraumatic, no cyanosis or edema Inc C/D/I  Assessment: s/p Procedure(s): Abdominal Hysterectomy with bilateral salpingectomy, and Right oophorectomy (N/A) SCAR REVISION (N/A): stable, progressing well and tolerating diet  Plan: Encourage ambulation Discharge home.  D/C with motrin, percocet - f/u 2 weeks  LOS: 3 days    [name] 11/15/2013, 7:49 AM

## 2013-11-15 NOTE — Discharge Summary (Signed)
Physician Discharge Summary  Patient ID: [name] MRN: [id number] DOB/AGE: Apr 29, 1986 28 y.o.  Admit date: 11/12/2013 Discharge date: 11/15/2013  Admission Diagnoses: fibroids, dysmenorrhea, pelvic pain  Discharge Diagnoses:  Principal Problem:   S/P TAH (total abdominal hysterectomy) Active Problems:   Fibroids, intramural   Discharged Condition: good  Hospital Course: Pt admitted for TAH/ B salpingectomy did well post-operatively, some pain control and Nausea issues.  Ambulating, voiding and passing gas on POD #3.  Discharge to home  Consults: None  Significant Diagnostic Studies: labs: CBC, CMP, benign pathology  Treatments: analgesia: Dilaudid and PCA and surgery: TAH, B salpingectomy, R oopherectomy  Discharge Exam: Blood pressure 106/52, pulse 84, temperature 97.6 F (36.4 C), temperature source Oral, resp. rate 20, height 5\' 7"  (1.702 m), weight 103.874 kg (229 lb), SpO2 99.00%. General appearance: alert and no distress Resp: clear to auscultation bilaterally Cardio: regular rate and rhythm Incision/Wound:C/D/I Abd: soft, app tender, ND  Disposition: 01-Home or Self Care  Discharge Instructions   Call MD for:  persistant nausea and vomiting    Complete by:  As directed      Call MD for:  redness, tenderness, or signs of infection (pain, swelling, redness, odor or green/yellow discharge around incision site)    Complete by:  As directed      Call MD for:  severe uncontrolled pain    Complete by:  As directed      Diet - low sodium heart healthy    Complete by:  As directed      Discharge instructions    Complete by:  As directed   Call [phone] with questions or problems     Driving Restrictions    Complete by:  As directed   While taking strong pain medicine     Increase activity slowly    Complete by:  As directed      Lifting restrictions    Complete by:  As directed   No greater than 15-20 lbs     May shower / Bathe    Complete by:  As directed       May walk up steps    Complete by:  As directed      Sexual Activity Restrictions    Complete by:  As directed   Pelvic rest - no douching, tampons or sex for 6 weeks            Medication List    STOP taking these medications       leuprolide 11.25 MG injection  Commonly known as:  LUPRON      TAKE these medications       albuterol 108 (90 BASE) MCG/ACT inhaler  Commonly known as:  PROVENTIL HFA;VENTOLIN HFA  Inhale 2 puffs into the lungs every 4 (four) hours as needed for wheezing or shortness of breath.     diphenhydrAMINE 25 MG tablet  Commonly known as:  BENADRYL  Take 25 mg by mouth every 6 (six) hours as needed for itching (use with dilaudid).     HYDROmorphone 2 MG tablet  Commonly known as:  DILAUDID  Take 1-2 tablets (2-4 mg total) by mouth every 6 (six) hours as needed for severe pain.     hydrOXYzine 25 MG tablet  Commonly known as:  ATARAX/VISTARIL  Take 1 tablet (25 mg total) by mouth every 6 (six) hours as needed.     lidocaine 5 %  Commonly known as:  LIDODERM  Place 1 patch onto the skin  daily. Remove & Discard patch within 12 hours or as directed by MD     lurasidone 40 MG Tabs tablet  Commonly known as:  LATUDA  Take 40 mg by mouth daily with breakfast.     methocarbamol 750 MG tablet  Commonly known as:  ROBAXIN  Take 1 tablet (750 mg total) by mouth 4 (four) times daily.     naproxen 500 MG tablet  Commonly known as:  NAPROSYN  Take 1 tablet (500 mg total) by mouth 2 (two) times daily as needed for mild pain or moderate pain.     polyethylene glycol packet  Commonly known as:  MIRALAX / GLYCOLAX  Take 17 g by mouth daily.     promethazine 12.5 MG tablet  Commonly known as:  PHENERGAN  Take 1 tablet (12.5 mg total) by mouth 3 times/day as needed-between meals & bedtime for nausea or vomiting.     propranolol ER 80 MG 24 hr capsule  Commonly known as:  INDERAL LA  Take 80 mg by mouth daily.     SUMAtriptan 50 MG tablet  Commonly  known as:  IMITREX  Take 50 mg by mouth every 2 (two) hours as needed for migraine or headache. May repeat in 2 hours if headache persists or recurs.     venlafaxine 75 MG tablet  Commonly known as:  EFFEXOR  Take 2 tablets (150 mg total) by mouth daily.           Follow-up Information   Follow up with Bovard-Stuckert, Jaquay Posthumus, MD. Schedule an appointment as soon as possible for a visit in 2 weeks. (for incision check, 6 weeks for full post-op check)    Specialty:  Obstetrics and Gynecology   Contact information:   [place] [place] [address] [phone]       Signed: Janyth Contes 11/15/2013, 8:27 AM

## 2013-11-15 NOTE — Progress Notes (Signed)
Pt. Is discharged in the care of Aunt with R.N.. Escort. Discharged instructions with Rx were given to pt. Questions were asked and answered. Abd. Honeycomb dressing is dry and intact denies any pain or discomfort. Understands all instructions well.

## 2013-11-24 ENCOUNTER — Inpatient Hospital Stay (HOSPITAL_COMMUNITY): Payer: 59

## 2013-11-24 ENCOUNTER — Inpatient Hospital Stay (HOSPITAL_COMMUNITY)
Admission: AD | Admit: 2013-11-24 | Discharge: 2013-11-26 | DRG: 758 | Disposition: A | Discharge status: Home / Self Care | Payer: 59 | Source: Ambulatory Visit | Attending: Obstetrics and Gynecology | Admitting: Obstetrics and Gynecology

## 2013-11-24 ENCOUNTER — Encounter (HOSPITAL_COMMUNITY): Payer: Self-pay | Admitting: *Deleted

## 2013-11-24 DIAGNOSIS — Z90710 Acquired absence of both cervix and uterus: Secondary | ICD-10-CM

## 2013-11-24 DIAGNOSIS — Y838 Other surgical procedures as the cause of abnormal reaction of the patient, or of later complication, without mention of misadventure at the time of the procedure: Secondary | ICD-10-CM | POA: Diagnosis present

## 2013-11-24 DIAGNOSIS — IMO0002 Reserved for concepts with insufficient information to code with codable children: Secondary | ICD-10-CM | POA: Diagnosis present

## 2013-11-24 DIAGNOSIS — R197 Diarrhea, unspecified: Secondary | ICD-10-CM | POA: Diagnosis present

## 2013-11-24 DIAGNOSIS — N739 Female pelvic inflammatory disease, unspecified: Secondary | ICD-10-CM

## 2013-11-24 DIAGNOSIS — N949 Unspecified condition associated with female genital organs and menstrual cycle: Secondary | ICD-10-CM | POA: Diagnosis present

## 2013-11-24 DIAGNOSIS — R110 Nausea: Secondary | ICD-10-CM | POA: Diagnosis present

## 2013-11-24 DIAGNOSIS — N731 Chronic parametritis and pelvic cellulitis: Principal | ICD-10-CM | POA: Diagnosis present

## 2013-11-24 LAB — COMPREHENSIVE METABOLIC PANEL
ALBUMIN: 3.6 g/dL (ref 3.5–5.2)
ALT: 8 U/L (ref 0–35)
AST: 10 U/L (ref 0–37)
Alkaline Phosphatase: 96 U/L (ref 39–117)
BILIRUBIN TOTAL: 0.4 mg/dL (ref 0.3–1.2)
BUN: 7 mg/dL (ref 6–23)
CO2: 25 mEq/L (ref 19–32)
CREATININE: 0.74 mg/dL (ref 0.50–1.10)
Calcium: 9.6 mg/dL (ref 8.4–10.5)
Chloride: 100 mEq/L (ref 96–112)
GFR calc non Af Amer: >90 mL/min (ref >90)
Glucose, Bld: 90 mg/dL (ref 70–99)
Potassium: 3.9 mEq/L (ref 3.7–5.3)
Sodium: 137 mEq/L (ref 137–147)
Total Protein: 7.2 g/dL (ref 6.0–8.3)

## 2013-11-24 LAB — URINALYSIS, ROUTINE W REFLEX MICROSCOPIC
Bilirubin Urine: NEGATIVE
GLUCOSE, UA: NEGATIVE mg/dL
Hgb urine dipstick: NEGATIVE
Ketones, ur: NEGATIVE mg/dL
LEUKOCYTES UA: NEGATIVE
NITRITE: NEGATIVE
PH: 6 (ref 5.0–8.0)
PROTEIN: NEGATIVE mg/dL
Specific Gravity, Urine: >1.03 — ABNORMAL HIGH (ref 1.005–1.030)
Urobilinogen, UA: 0.2 mg/dL (ref 0.0–1.0)

## 2013-11-24 LAB — CBC
HEMATOCRIT: 29.9 % — AB (ref 36.0–46.0)
Hemoglobin: 10.5 g/dL — ABNORMAL LOW (ref 12.0–15.0)
MCH: 31.6 pg (ref 26.0–34.0)
MCHC: 35.1 g/dL (ref 30.0–36.0)
MCV: 90.1 fL (ref 78.0–100.0)
Platelets: 378 10*3/uL (ref 150–400)
RBC: 3.32 MIL/uL — ABNORMAL LOW (ref 3.87–5.11)
RDW: 12.2 % (ref 11.5–15.5)
WBC: 7.8 10*3/uL (ref 4.0–10.5)

## 2013-11-24 MED ORDER — GUAIFENESIN 100 MG/5ML PO SOLN: 15.0000 mL | ORAL | Status: DC | PRN

## 2013-11-24 MED ORDER — ALUM & MAG HYDROXIDE-SIMETH 200-200-20 MG/5ML PO SUSP: 30.0000 mL | ORAL | Status: DC | PRN

## 2013-11-24 MED ORDER — PIPERACILLIN-TAZOBACTAM 3.375 G IVPB
3.3750 g | Freq: Three times a day (TID) | INTRAVENOUS | Status: DC
  Administered 2013-11-24 – 2013-11-26 (×5): 3.375 g via INTRAVENOUS
  Filled 2013-11-24 (×6): qty 50

## 2013-11-24 MED ORDER — ONDANSETRON HCL 4 MG/2ML IJ SOLN: 4.0000 mg | Freq: Four times a day (QID) | INTRAMUSCULAR | Status: DC | PRN

## 2013-11-24 MED ORDER — SIMETHICONE 80 MG PO CHEW
80.0000 mg | CHEWABLE_TABLET | Freq: Four times a day (QID) | ORAL | Status: DC | PRN
  Filled 2013-11-24: qty 1

## 2013-11-24 MED ORDER — OXYCODONE-ACETAMINOPHEN 5-325 MG PO TABS
1.0000 | ORAL_TABLET | ORAL | Status: DC | PRN
  Administered 2013-11-24 – 2013-11-26 (×5): 2 via ORAL
  Filled 2013-11-24 (×6): qty 2

## 2013-11-24 MED ORDER — LACTATED RINGERS IV SOLN
INTRAVENOUS | Status: DC
  Administered 2013-11-24: 16:00:00 via INTRAVENOUS

## 2013-11-24 MED ORDER — MENTHOL 3 MG MT LOZG
1.0000 | LOZENGE | OROMUCOSAL | Status: DC | PRN
  Filled 2013-11-24: qty 9

## 2013-11-24 MED ORDER — IOHEXOL 300 MG/ML  SOLN
50.0000 mL | INTRAMUSCULAR | Status: AC
  Administered 2013-11-24 (×2): 50 mL via ORAL

## 2013-11-24 MED ORDER — LACTATED RINGERS IV SOLN
INTRAVENOUS | Status: DC
  Administered 2013-11-24 – 2013-11-26 (×4): via INTRAVENOUS

## 2013-11-24 MED ORDER — IOHEXOL 300 MG/ML  SOLN
100.0000 mL | Freq: Once | INTRAMUSCULAR | Status: AC | PRN
  Administered 2013-11-24: 100 mL via INTRAVENOUS

## 2013-11-24 MED ORDER — IBUPROFEN 800 MG PO TABS
800.0000 mg | ORAL_TABLET | Freq: Three times a day (TID) | ORAL | Status: DC | PRN
  Administered 2013-11-24 – 2013-11-26 (×3): 800 mg via ORAL
  Filled 2013-11-24 (×2): qty 1
  Filled 2013-11-24: qty 2
  Filled 2013-11-24: qty 1

## 2013-11-24 MED ORDER — ONDANSETRON HCL 4 MG PO TABS
4.0000 mg | ORAL_TABLET | Freq: Four times a day (QID) | ORAL | Status: DC | PRN
  Administered 2013-11-25 – 2013-11-26 (×2): 4 mg via ORAL
  Filled 2013-11-24 (×2): qty 1

## 2013-11-24 NOTE — MAU Note (Signed)
Patient presents to have her incision assessed. Had a hysterectomy performed on the 12th.

## 2013-11-24 NOTE — H&P (Addendum)
[name] is an 28 y.o. female G0 s/p TAH/ B salpingectomy 11/12/13.  Called with nausea, inability to tol po, diarrhea, after severe constipation, states has stopped narcotic.  No chills, no ST.  Fever several days ago.  Didn't call MD.  Vertical incision - superficaial dehiscence at superior and inferior aspects.  O/W intact.  Some dysuria.    Pertinent Gynecological History: Sexually transmitted diseases: remote h/o Chl, trich Previous GYN Procedures: myomectomy, TAH 11/12/13  Last pap: normal  OB History: G0, P0  Multiple large intramural fibroids  Menstrual History:  Patient's last menstrual period was 09/29/2013.    Past Medical History  Diagnosis Date  . Allergy     seasonal allergies  . Pelvic cyst     Seen by gynecologist Dr. Harrington Challenger. Scheduled for ex lap on 12/02/11  . Depression   . Fibroids   . Headache(784.0)     per patient tx for HA  . Bipolar disorder   . Asthma   . History of blood transfusion 11/2011    [facility]     Past Surgical History  Procedure Laterality Date  . Myomectomy  12/02/2011    Procedure: MYOMECTOMY;  Surgeon: Gus Height, MD;  Location: [facility];  Service: Gynecology;  Laterality: N/A;  . Abdominal hysterectomy N/A 11/12/2013    Procedure: Abdominal Hysterectomy with bilateral salpingectomy, and Right oophorectomy;  Surgeon: Janyth Contes, MD;  Location: [facility];  Service: Gynecology;  Laterality: N/A;  . Scar revision N/A 11/12/2013    Procedure: SCAR REVISION;  Surgeon: Janyth Contes, MD;  Location: [facility];  Service: Gynecology;  Laterality: N/A;    Family History  Problem Relation Age of Onset  . Heart disease Mother     Social History:  reports that she has never smoked. She has never used smokeless tobacco. She reports that she does not drink alcohol or use illicit drugs.  Allergies:  Allergies  Allergen Reactions  . Dilaudid [Hydromorphone Hcl] Itching    Pt is taking this currently  . Clindamycin/Lincomycin Itching and  Swelling  . Penicillins Other (See Comments)    Unknown - childhood reaction; Ok to have Ancef for pre-op antibiotic per Dr Melba Coon    Prescriptions prior to admission  Medication Sig Dispense Refill  . diphenhydrAMINE (BENADRYL) 25 MG tablet Take 25 mg by mouth every 6 (six) hours as needed for itching (use with dilaudid).       Marland Kitchen HYDROmorphone (DILAUDID) 2 MG tablet Take 1-2 tablets (2-4 mg total) by mouth every 6 (six) hours as needed for severe pain.  30 tablet  0  . hydrOXYzine (ATARAX/VISTARIL) 25 MG tablet Take 1 tablet (25 mg total) by mouth every 6 (six) hours as needed.  30 tablet  0  . lidocaine (LIDODERM) 5 % Place 1 patch onto the skin daily. Remove & Discard patch within 12 hours or as directed by MD  30 patch  1  . lurasidone (LATUDA) 40 MG TABS tablet Take 40 mg by mouth daily with breakfast.      . methocarbamol (ROBAXIN) 750 MG tablet Take 1 tablet (750 mg total) by mouth 4 (four) times daily.  120 tablet  1  . naproxen (NAPROSYN) 500 MG tablet Take 1 tablet (500 mg total) by mouth 2 (two) times daily as needed for mild pain or moderate pain.  45 tablet  1  . propranolol ER (INDERAL LA) 80 MG 24 hr capsule Take 80 mg by mouth daily.      . SUMAtriptan (  IMITREX) 50 MG tablet Take 50 mg by mouth every 2 (two) hours as needed for migraine or headache. May repeat in 2 hours if headache persists or recurs.      . venlafaxine (EFFEXOR) 75 MG tablet Take 2 tablets (150 mg total) by mouth daily.  60 tablet  2  . albuterol (PROVENTIL HFA;VENTOLIN HFA) 108 (90 BASE) MCG/ACT inhaler Inhale 2 puffs into the lungs every 4 (four) hours as needed for wheezing or shortness of breath.        Review of Systems  Constitutional: Positive for fever and malaise/fatigue.       Few days ago  HENT: Negative.   Eyes: Negative.   Respiratory: Negative.   Cardiovascular: Negative.   Gastrointestinal: Positive for nausea, abdominal pain and diarrhea.  Genitourinary: Negative.        States  concentrated  Musculoskeletal: Negative.   Skin: Negative.   Neurological: Negative.   Psychiatric/Behavioral: Negative.     Blood pressure 130/80, pulse 76, temperature 98.4 F (36.9 C), temperature source Oral, resp. rate 18, height 5' 6.5" (1.689 m), weight 100.358 kg (221 lb 4 oz), last menstrual period 09/29/2013. Physical Exam  Constitutional: She is oriented to person, place, and time. She appears well-developed and well-nourished.  HENT:  Head: Normocephalic and atraumatic.  Cardiovascular: Normal rate and regular rhythm.   Respiratory: Effort normal and breath sounds normal. No respiratory distress. She has no wheezes.  GI: Soft. Bowel sounds are normal. She exhibits no distension. There is no tenderness.  Inc - superficial dehiscence at superior and inferior aspect  Musculoskeletal: Normal range of motion.  Neurological: She is alert and oriented to person, place, and time.  Skin: Skin is warm and dry.  Psychiatric: She has a normal mood and affect. Her behavior is normal.    Results for orders placed during the hospital encounter of 11/24/13 (from the past 24 hour(s))  CBC     Status: Abnormal   Collection Time    11/24/13  2:42 PM      Result Value Ref Range   WBC 7.8  4.0 - 10.5 K/uL   RBC 3.32 (*) 3.87 - 5.11 MIL/uL   Hemoglobin 10.5 (*) 12.0 - 15.0 g/dL   HCT 29.9 (*) 36.0 - 46.0 %   MCV 90.1  78.0 - 100.0 fL   MCH 31.6  26.0 - 34.0 pg   MCHC 35.1  30.0 - 36.0 g/dL   RDW 12.2  11.5 - 15.5 %   Platelets 378  150 - 400 K/uL     Assessment/Plan: 28yo G0 s/p TAH/B salpingectomy 11/12/13 POD#12 CBC, CMP KUB UA/Ur Cx CT Abd/Pelvis   [name] 11/24/2013, 3:07 PM   Addendum.  Pt shown to have pelvic abscess on CT, will cool off with Zosyn and call IR for drainage Mon/Tues.  D/W pharmacy and Patient

## 2013-11-25 LAB — BASIC METABOLIC PANEL
BUN: 8 mg/dL (ref 6–23)
CO2: 25 meq/L (ref 19–32)
CREATININE: 0.81 mg/dL (ref 0.50–1.10)
Calcium: 9.3 mg/dL (ref 8.4–10.5)
Chloride: 103 mEq/L (ref 96–112)
GFR calc Af Amer: >90 mL/min (ref >90)
GLUCOSE: 103 mg/dL — AB (ref 70–99)
Potassium: 4 mEq/L (ref 3.7–5.3)
SODIUM: 139 meq/L (ref 137–147)

## 2013-11-25 LAB — CBC
HCT: 28.5 % — ABNORMAL LOW (ref 36.0–46.0)
Hemoglobin: 9.9 g/dL — ABNORMAL LOW (ref 12.0–15.0)
MCH: 31.3 pg (ref 26.0–34.0)
MCHC: 34.7 g/dL (ref 30.0–36.0)
MCV: 90.2 fL (ref 78.0–100.0)
Platelets: 332 10*3/uL (ref 150–400)
RBC: 3.16 MIL/uL — AB (ref 3.87–5.11)
RDW: 12.2 % (ref 11.5–15.5)
WBC: 6.6 10*3/uL (ref 4.0–10.5)

## 2013-11-25 LAB — URINE CULTURE
Colony Count: NO GROWTH
Culture: NO GROWTH

## 2013-11-25 NOTE — Progress Notes (Signed)
Ur chart review completed.  

## 2013-11-25 NOTE — Progress Notes (Signed)
S/p TAH 5/12 with hematoma vs small abcess at cuff  Subjective: Patient reports some continued pelvic pain.  She has been tolerating a regular diet, ambulating well. No significant vaginal drainage  Objective: I have reviewed patient's vital signs, intake and output and labs.  General: alert and cooperative GI: soft NT  Assessment: Pt with pelvic abscess vs hematoma , on zosyn but not 24 hours completed yet Clinically looks good.  Eating well with no N/V Minimal temperature (99) and WBC WNL Plan: D/w Interventional Radiology who viewed pt CT Scan results. Drain placement would not be easy, but if pt not improving, they would be happy to attempt transgluteal drain.  They recommend continuing antibiotics for now and if pt clinically improves, may not need placement.  She looks good today.  D/w pt and she states she understands plan.  LOS: 1 day    Logan Bores 11/25/2013, 12:01 PM

## 2013-11-26 LAB — CBC
HEMATOCRIT: 28.1 % — AB (ref 36.0–46.0)
Hemoglobin: 9.7 g/dL — ABNORMAL LOW (ref 12.0–15.0)
MCH: 31.1 pg (ref 26.0–34.0)
MCHC: 34.5 g/dL (ref 30.0–36.0)
MCV: 90.1 fL (ref 78.0–100.0)
PLATELETS: 327 10*3/uL (ref 150–400)
RBC: 3.12 MIL/uL — ABNORMAL LOW (ref 3.87–5.11)
RDW: 12.3 % (ref 11.5–15.5)
WBC: 5.6 10*3/uL (ref 4.0–10.5)

## 2013-11-26 MED ORDER — AMOXICILLIN-POT CLAVULANATE 875-125 MG PO TABS
1.0000 | ORAL_TABLET | Freq: Two times a day (BID) | ORAL | Status: DC
  Administered 2013-11-26: 1 via ORAL
  Filled 2013-11-26: qty 1

## 2013-11-26 MED ORDER — AMOXICILLIN-POT CLAVULANATE 875-125 MG PO TABS
1.0000 | ORAL_TABLET | Freq: Two times a day (BID) | ORAL | Status: DC
Start: 2013-11-26 — End: 2014-09-23

## 2013-11-26 MED ORDER — OXYCODONE-ACETAMINOPHEN 5-325 MG PO TABS: 1.0000 | ORAL_TABLET | Freq: Four times a day (QID) | ORAL | Status: DC | PRN

## 2013-11-26 NOTE — Progress Notes (Signed)
Pt education complete. Pt understood all instructions and did not have any questions. Pt discharged home with family. Pt to car via wheelchair with tech.

## 2013-11-26 NOTE — Discharge Summary (Signed)
Physician Discharge Summary  Patient ID: [name] MRN: [id number] DOB/AGE: Oct 20, 1985 28 y.o.  Admit date: 11/24/2013 Discharge date: 11/26/2013  Admission Diagnoses: hematoma/abscess at cuff  Discharge Diagnoses:  Active Problems:   Pelvic abscess in female   Discharged Condition: stable  Hospital Course: Admitted with hematoma vs abscess at cuff.  Pt also c/o N?V inability to tolerate po.  Given IV hydration and abx.  Tolerated a diet, WBC WNL.  Will d/c with abx  Consults: None  Significant Diagnostic Studies: labs: CBC, CMP and radiology: KUB: with gas noted and CT scan: hematoma vs abscess in pelvis  Treatments: IV hydration, antibiotics: Zosyn and analgesia: Percocet  Discharge Exam: Blood pressure 117/67, pulse 64, temperature 97 F (36.1 C), temperature source Oral, resp. rate 18, height 5' 6.5" (1.689 m), weight 100.358 kg (221 lb 4 oz), last menstrual period 09/29/2013, SpO2 100.00%. General appearance: alert and no distress Resp: clear to auscultation bilaterally Cardio: regular rate and rhythm GI: soft, non-tender; bowel sounds normal; no masses,  no organomegaly Extremities: extremities normal, atraumatic, no cyanosis or edema Incision/Wound:C/D/I  Disposition: 01-Home or Self Care  Discharge Instructions   Call MD for:  persistant nausea and vomiting    Complete by:  As directed      Call MD for:  redness, tenderness, or signs of infection (pain, swelling, redness, odor or green/yellow discharge around incision site)    Complete by:  As directed      Call MD for:  severe uncontrolled pain    Complete by:  As directed      Call MD for:  temperature >100.4    Complete by:  As directed      Diet - low sodium heart healthy    Complete by:  As directed      Discharge instructions    Complete by:  As directed   Call [phone] with questions or problems     Driving Restrictions    Complete by:  As directed   While taking strong pain medicine      Increase activity slowly    Complete by:  As directed      Lifting restrictions    Complete by:  As directed   No greater than 15-20 lbs     May shower / Bathe    Complete by:  As directed      May walk up steps    Complete by:  As directed      Sexual Activity Restrictions    Complete by:  As directed   Pelvic rest - no douching tampons or sex for 6 weeks after surgery            Medication List         albuterol 108 (90 BASE) MCG/ACT inhaler  Commonly known as:  PROVENTIL HFA;VENTOLIN HFA  Inhale 2 puffs into the lungs every 4 (four) hours as needed for wheezing or shortness of breath.     amoxicillin-clavulanate 875-125 MG per tablet  Commonly known as:  AUGMENTIN  Take 1 tablet by mouth 2 (two) times daily.     diphenhydrAMINE 25 MG tablet  Commonly known as:  BENADRYL  Take 25 mg by mouth every 6 (six) hours as needed for itching (use with dilaudid).     HYDROmorphone 2 MG tablet  Commonly known as:  DILAUDID  Take 1-2 tablets (2-4 mg total) by mouth every 6 (six) hours as needed for severe pain.     hydrOXYzine 25 MG  tablet  Commonly known as:  ATARAX/VISTARIL  Take 1 tablet (25 mg total) by mouth every 6 (six) hours as needed.     lidocaine 5 %  Commonly known as:  LIDODERM  Place 1 patch onto the skin daily. Remove & Discard patch within 12 hours or as directed by MD     lurasidone 40 MG Tabs tablet  Commonly known as:  LATUDA  Take 40 mg by mouth daily with breakfast.     methocarbamol 750 MG tablet  Commonly known as:  ROBAXIN  Take 1 tablet (750 mg total) by mouth 4 (four) times daily.     naproxen 500 MG tablet  Commonly known as:  NAPROSYN  Take 1 tablet (500 mg total) by mouth 2 (two) times daily as needed for mild pain or moderate pain.     oxyCODONE-acetaminophen 5-325 MG per tablet  Commonly known as:  PERCOCET/ROXICET  Take 1-2 tablets by mouth every 6 (six) hours as needed for severe pain (moderate to severe pain (when tolerating fluids)).      propranolol ER 80 MG 24 hr capsule  Commonly known as:  INDERAL LA  Take 80 mg by mouth daily.     SUMAtriptan 50 MG tablet  Commonly known as:  IMITREX  Take 50 mg by mouth every 2 (two) hours as needed for migraine or headache. May repeat in 2 hours if headache persists or recurs.     venlafaxine 75 MG tablet  Commonly known as:  EFFEXOR  Take 2 tablets (150 mg total) by mouth daily.           Follow-up Information   Follow up with Bovard-Stuckert, Jeral Fruit, MD. (As scheduled for incision check!  )    Specialty:  Obstetrics and Gynecology   Contact information:   [address] [place] [address] [phone]       Signed: Janyth Contes 11/26/2013, 8:22 AM

## 2013-11-26 NOTE — Progress Notes (Signed)
Subjective: Patient reports incisional pain, tolerating PO and no problems voiding  Feeling well.  .    Objective: I have reviewed patient's vital signs, intake and output, medications and labs.  General: alert and no distress Resp: clear to auscultation bilaterally Cardio: regular rate and rhythm GI: soft, non-tender; bowel sounds normal; no masses,  no organomegaly and incision: clean, dry and intact Extremities: extremities normal, atraumatic, no cyanosis or edema    Assessment/Plan: 28yo G0P0 s/p TAH 5/12 with hematoma at cuff.  Admitted for abx and hydration. D/C home with precautions Push fluids Augmentin for abx x 10d   LOS: 2 days    [name] 11/26/2013, 8:11 AM

## 2013-12-05 ENCOUNTER — Encounter (HOSPITAL_COMMUNITY): Payer: Self-pay | Admitting: Obstetrics and Gynecology

## 2014-01-10 ENCOUNTER — Other Ambulatory Visit: Payer: Self-pay | Admitting: Family Medicine

## 2014-04-15 ENCOUNTER — Encounter: Payer: Self-pay | Admitting: Psychology

## 2014-04-15 ENCOUNTER — Other Ambulatory Visit: Payer: Self-pay | Admitting: *Deleted

## 2014-04-15 ENCOUNTER — Encounter: Payer: Self-pay | Admitting: Family Medicine

## 2014-04-16 ENCOUNTER — Other Ambulatory Visit: Payer: Self-pay | Admitting: *Deleted

## 2014-04-16 MED ORDER — VENLAFAXINE HCL 75 MG PO TABS: 150.0000 mg | ORAL_TABLET | Freq: Every day | ORAL | Status: DC

## 2014-04-16 NOTE — Telephone Encounter (Signed)
Patient requested refill yesterday by email: I asked her when the last time she took the medication was however she never replied to my response (althougth I can tell she read my reply).  Additionally, attempted to call patient x 2 with no answer from her work and mobile numbers. After speaking with her pharmacy I will refill her Rx for 1 month.  Please let the patient know she has a month supply and have her make an appointment with me in the next month so we can discuss the taper that her and Dr. Otis Dials were completing.  Thanks, Archie Patten, MD [facility] Family Medicine Resident  04/16/2014, 4:39 PM

## 2014-04-16 NOTE — Telephone Encounter (Signed)
2nd request.  Jermale Crass L, RN  

## 2014-04-21 ENCOUNTER — Encounter: Payer: Self-pay | Admitting: Psychology

## 2014-04-22 NOTE — Telephone Encounter (Signed)
Left message on voicemail informing patient that rx had been sent in for 1 month and to scheduled an appointment for any addition refills and to discuss taper.

## 2014-06-13 ENCOUNTER — Telehealth: Payer: Self-pay | Admitting: Psychology

## 2014-06-13 NOTE — Telephone Encounter (Signed)
Patient called to request "something" to help with her mood.  She was seen one time in [facility] and did not follow-up.  She was started on Latuda and was weaning off of Effexor.  Managed the latter but did not tolerate the Latuda secondary to sedation.  She is not taking any meds for mood right now and is interested in coming back into [facility].  Scheduled for first available which is 07/09/14.  This is fortuitous as her new physician should be a part of the treatment team that day.  Of note, we recommended patient get grief counseling through Hospice or more broad based counseling through a [facility] as part of her treatment.  Will need to check in on that at [facility] appointment.

## 2014-07-09 ENCOUNTER — Ambulatory Visit (INDEPENDENT_AMBULATORY_CARE_PROVIDER_SITE_OTHER): Payer: 59 | Admitting: Psychology

## 2014-07-09 DIAGNOSIS — F3181 Bipolar II disorder: Secondary | ICD-10-CM

## 2014-07-09 NOTE — Assessment & Plan Note (Addendum)
She is post work today and sleepy.  Affect is within normal limits.  She laughs and smiles readily.  Diagnosis was previously made as Bipolar 2 and her reports of symptoms continues to be consistent with this.  No evidence of SI / HI.    Treatment team used motivational interviewing to determine motivation to adhere to a regular medication regimen to help manage her mood.  She was a 9 on importance and a 9 on confidence.  We explored barriers and she was able to name a system for helping her take a medication daily.  See patient instructions for further plan.    Additionally, touched base on the grief issues.  She was tentative in her language with regards contacting Hospice.  Will follow-up here.    Anxiety will be tracked.  Treatment team suspects it will improve with better, sustained mood control.    Dr. Tammi Klippel completed paperwork for FMLA to hopefully preserve her job while she seeks to treatment for better mood management.  That paperwork was faxed back to [employer] and put in the Scan pile.

## 2014-07-09 NOTE — Patient Instructions (Signed)
Please schedule a follow up for:  January 20th at 9:00. It was great to see you today [name].  Don't stay away so long this time!!  : - ) We talked about your medicines and because the Latuda seemed effective and you can likely work around the challenges to taking it, you decided to restart it.   You agreed to use your phone alarm to ensure you take it consistently.  Eating it with food is best but not necessary.  Don't let this be the reason not to take medicine. If you are not working, this should be a night time medicine.   Getting grief counseling remains a really good option.  You said you might contact Hospice. We would be willing to fill out what needs to be filled out for [name].

## 2014-07-09 NOTE — Progress Notes (Signed)
[name] presents for follow-up.  She was last seen in March, 2015.  She wants to discuss her mood and anxiety.  Mood is described as "up, down, sad, mad" with "zero tolerance."  She did not present earlier because she doesn't really like coming to the doctor but family and friends were commenting on her mood which prompted her to schedule.    She is on final written warning at her [employer] based job.  She denies being written up for any behavioral issues; the difficulty is with calling out of work.  She reports calling out three days in the last month.    She reports a pattern of mood that includes short burst of high energy, increased social interaction, and decreased need for sleep followed by a crash into depressed mood where she lacks motivation, can't get out of bed, is irritable, and has physical pain (body and headache).    She reports she took Latuda 20 mg for about 30 days and noticed an improvement in her mood.  States her friends commented on the difference as well.  Barriers to taking the medicine were needing to get in 350 kcals and somnolence when taken in the middle of the day (days she didn't work).  She also noted post work when she took it, she slept "really hard" for about 8 hours.

## 2014-07-23 ENCOUNTER — Ambulatory Visit (INDEPENDENT_AMBULATORY_CARE_PROVIDER_SITE_OTHER): Payer: 59 | Admitting: Psychology

## 2014-07-23 DIAGNOSIS — F3181 Bipolar II disorder: Secondary | ICD-10-CM

## 2014-07-23 NOTE — Progress Notes (Signed)
[name] presents for follow-up.  She reports she has been taking the [place] regularly with about two missed doses in the last week.  She has noticed improvement in her mood but a reoccurrence of a side effect - feeling fidgety, restless, and like she has to move.  She says this was present last time she took the medicine although she did not bring it up; she was focused on the medicine making her drowsy and having a tough time taking it with food.    She continues to consider grief counseling but is having a hard time fitting it in with school and work.  Attends [employer] in [employer] for [employer].  She is passing but would like to do better than she currently is.  She remains in corrective action at work.  Continues to have trouble going to work but is attending secondary to not wanting to lose her job.

## 2014-07-23 NOTE — Patient Instructions (Signed)
Please schedule a follow-up for:  08/20/14.  If this strategy is not working well, please call me at [phone].   It was great to see you today [name].  Thanks for coming in.  Hopefully this will help with the side effect.  You identified doing better in school and having a better feeling about your work as two indicators that you are feeling significantly better.  We will continue to track those.

## 2014-07-23 NOTE — Assessment & Plan Note (Signed)
Report of mood is improved.  Having difficulty tolerating the medicine secondary to akasthesia.  Discussed options including adding a medicine to attenuate the side effect or stopping Latuda and looking at other options (Lamictal, Depakote).  [name] does not want to lose the benefit the [name] is providing and is more interested in trying to manage the side effect.  Benadryl makes her very sleepy.  Dr. Tammi Klippel recommended Cogentin as a possibility.  Discussed and [name] agreed to this strategy.  Targets for improved function include better grades at school and out of corrective action and less dread about going to work.  Will follow in one month.  Preferred two weeks but we don't have an early enough appointment for her work schedule.  Asked her to call in between if she is having difficulty.

## 2014-08-20 ENCOUNTER — Ambulatory Visit (INDEPENDENT_AMBULATORY_CARE_PROVIDER_SITE_OTHER): Payer: 59 | Admitting: Psychology

## 2014-08-20 DIAGNOSIS — F3181 Bipolar II disorder: Secondary | ICD-10-CM

## 2014-08-20 NOTE — Progress Notes (Signed)
[name] presents for follow-up.  She reports she is taking Latuda 20 mg and Cogentin 0.5 mg.  She has missed about two doses in the last month secondary to her work schedule / forgetting.  She reports 3-4 days of bothersome depressed mood in the last two weeks.  The mood lasted all day and was accompanied by  1-2 days of significant irritability.  She has also had a few days of a lack of motivation.    In terms of her target outcomes, she says she feels a bit more focused with her schoolwork.  She has missed one day of work in the last month secondary to not feeling like she could go.  It continues to be a bad match in terms of the grief associated with her mother's death.  Additionally, she struggles with feeling unappreciated on 3rd shift and overwhelmed with the work.

## 2014-08-20 NOTE — Assessment & Plan Note (Signed)
She reports she is feeling well today.  Thoughts are clear and goal directed.  Is most concerned about feeling "antsy."  The cogentin worked for a brief while and then she started feeling this way again.  Discussed options.  Revisited possibility of Depakote and Lamictal.  Also considered increasing the Latuda along with an increase in Cogentin.  Decided the latter with close follow-up via phone.  If this combination is not tolerable, she is to call me and we will move to the second option which would be to go back to 20 mg of Latuda and add Depakote.  She voiced an understanding of this plan.  See patient instructions for further plan.

## 2014-08-20 NOTE — Patient Instructions (Signed)
Please schedule a follow-up for:  March 16th at 9:30.   I need a phone call in one week or less to let me know how this change in your medicine is going.  My direct phone number is [phone]. Dr. Tammi Klippel recommended that you increase your Latuda to 40 mg and increase your dose of Cogentin to 1.0 mg.  This will hopefully get your mood better managed while also decreasing the antsy feeling you have. GRIEF COUNSELING.

## 2014-09-08 ENCOUNTER — Telehealth: Payer: Self-pay | Admitting: Psychology

## 2014-09-08 NOTE — Telephone Encounter (Signed)
Continue to get repeated faxes for documentation for FMLA for this patient.  I faxed back a clarifying question last week and heard nothing and got additional paperwork today.  I called [employer].  The person that answered the phone said that Neysa Hotter is [employer] claim specialist.  Her Extension is [phone] (phone number [phone]).  I called and left a message asking for a return phone call.

## 2014-09-09 NOTE — Telephone Encounter (Signed)
Ms. [name] left a VM.  I called her back and left a VM with the details of what I would like to know.

## 2014-09-16 ENCOUNTER — Telehealth: Payer: Self-pay | Admitting: Psychology

## 2014-09-16 NOTE — Telephone Encounter (Signed)
[name] called and left a VM yesterday at 3:44.  I called her back today and left a VM.  She was tearful on her message stating she was having difficulty sleeping and feeling down.  She does not think the medicine is helping.    We have a [facility] appointment scheduled for tomorrow at 9:30.  I encouraged her to attend that appointment for further discussion.  I asked that she call me back so that today though to check in as to how she is doing.

## 2014-09-17 ENCOUNTER — Telehealth: Payer: Self-pay | Admitting: Psychology

## 2014-09-17 ENCOUNTER — Ambulatory Visit: Payer: 59 | Admitting: Psychology

## 2014-09-17 NOTE — Telephone Encounter (Signed)
[name] called the front office to cancel her appointment today (she has my direct number).  [name] noted she was crying and asked if she wanted to speak to me.  [name] reportedly declined.  She had left a VM yesterday and I tried her back.  Called this morning to check-in to and had to leave a VM.  I expressed concern about how she was feeling and requested a phone call back.

## 2014-09-17 NOTE — Telephone Encounter (Signed)
[name] called back.  She is tearful on the phone.  Had a code at work the other day and has had a tough time bouncing back.  Hasn't missed work but didn't go to school today.  She reports passive SI.  Does not report a plan.  Denies intent.  No weapons in the home.  Discussed possibility of presenting to [facility] but doesn't want to because she works at [employer].  Asked her if I could speak to her manager.  She agreed and reported her Mudlogger at [employer] is [name].  Consulted with Elray Mcgregor, RN  my [facility] Director.  [name] stated speaking with [name]'s supervisor is not likely the best idea.  Spoke with [name], LCSW of [facility] EAP [phone]) to see what services might be available for her.    [name] [name]'s direct line is [phone].  Appointment scheduled for 3:00 tomorrow  [place] [address], [place] with the above information.  She was able to report back to me the appointment time and address.  She plans to work with [name] to see about her corrective action.  I asked that she call me tomorrow to let me know how the appointment went.  I also said that unless she feels strongly about it, she should not go to work today.    Also - when talking with [name], she said that EAP could work with [name]'s supervisor to get her to a position where her grief is not triggered so frequently - especially in the context of her mood disorder.

## 2014-09-18 ENCOUNTER — Telehealth: Payer: Self-pay | Admitting: Psychology

## 2014-09-18 NOTE — Telephone Encounter (Signed)
[name] left a VM.  I called her at 3:00 and she was at her appointment at the EAP office waiting to be seen.  I asked her to call me tomorrow and let me know how it went.

## 2014-09-23 ENCOUNTER — Encounter: Payer: Self-pay | Admitting: Podiatry

## 2014-09-23 ENCOUNTER — Ambulatory Visit (INDEPENDENT_AMBULATORY_CARE_PROVIDER_SITE_OTHER): Payer: 59 | Admitting: Podiatry

## 2014-09-23 ENCOUNTER — Ambulatory Visit (INDEPENDENT_AMBULATORY_CARE_PROVIDER_SITE_OTHER): Payer: 59

## 2014-09-23 ENCOUNTER — Telehealth: Payer: Self-pay | Admitting: Psychology

## 2014-09-23 VITALS — BP 134/78 | HR 78 | Resp 16 | Ht 67.0 in | Wt 238.0 lb

## 2014-09-23 DIAGNOSIS — M79672 Pain in left foot: Secondary | ICD-10-CM

## 2014-09-23 DIAGNOSIS — S92302A Fracture of unspecified metatarsal bone(s), left foot, initial encounter for closed fracture: Secondary | ICD-10-CM

## 2014-09-23 NOTE — Telephone Encounter (Addendum)
[name] called to discuss medications.  She has a follow-up appointment on Friday with her EAP counselor.  She said this past appointment they discussed how depressed she is and also about how her job is likely contributing to her depression.  Unfortunately, [name] missed her last [facility] appointment and next available is 4/6.  She works that day and can not make a late morning appointment.  I will check to see if Dr. Tammi Klippel can come in early to accommodate.  She has stopped taking the Latuda and Cogentin secondary to not being able to tolerate it.  Our back-up plan was to reduce Latuda to 20 mg and add Depakote.  [name] would like to initiate that now rather than wait for in person follow-up.  I will consult with Dr. Tammi Klippel and see what he says.  I will call [name] back to advise.

## 2014-09-23 NOTE — Progress Notes (Signed)
   Subjective:    Patient ID: [name], female    DOB: [birth year], 29 y.o.   MRN: [id number]  HPI Comments: "I have pain in this left foot"  Patient c/o sharp dorsal midfoot and achilles area left for about 3 weeks. The areas have been swollen off and on. No injury. Tried Ibuprofen-no help.     Review of Systems  Musculoskeletal: Positive for back pain.  All other systems reviewed and are negative.      Objective:   Physical Exam: I have reviewed her past mental history medications allergies surgery social history and review of systems. Pulses are strongly palpable left foot. Neurologic sensorium is intact. Deep tendon reflexes are intact bilateral muscle strength is 5 over 5 dorsiflexion plantar flexors and inverters everters on his musculatures intact. Orthopedic evaluation Simona Huh trace pes planus left foot. She has pain with range of motion of the second third metatarsal particularly at the bases. Radiographic evaluation did demonstrate fractures transverse in nature non-comminuted nondisplaced echo and third metatarsal bases.        Assessment & Plan:  Assessment: Fractured second and third metatarsal bases transverse in nature left foot.  Plan: Placed her in a Cam Walker and encouraged her to stay in this for the next 6 weeks. Radiographs will be taken when she comes in next.

## 2014-09-24 ENCOUNTER — Telehealth: Payer: Self-pay | Admitting: *Deleted

## 2014-09-24 ENCOUNTER — Telehealth: Payer: Self-pay | Admitting: Podiatry

## 2014-09-24 NOTE — Addendum Note (Signed)
Addended by: Zella Ball E on: 09/24/2014 11:49 AM   Modules accepted: Medications

## 2014-09-24 NOTE — Telephone Encounter (Addendum)
Discussed with Dr. Tammi Klippel who recommended she stay off the [place] secondary to not being able to tolerate it.  He recommends she start Depakote 500 mg for one week and then 1000 mg.  Will call in script on Dr. Johny Shears behalf to [facility] outpatient pharmacy.  Depakote 500 mg #60 take two daily with food.    Advised of most common AE including stomach upset.  Will get labs drawn at 4/6 appointment.  She is to call with any difficulty.  She was able to tell me back the plan.

## 2014-09-24 NOTE — Telephone Encounter (Signed)
PT CALLED AGAIN STATING SHE CALLED THIS MORNING AND HAS NOT HEARD BACK FROM A NURSE YET ABOUT WHAT SHE NEEDS TO DO REQUARDING HER BROKEN FOOT.

## 2014-09-24 NOTE — Telephone Encounter (Addendum)
Pt states she was diagnosed with a fractured foot and placed in a boot yesterday.  Pt request more information on the treatment of the fracture and she would like pain medication.  I left message with Dr. Stephenie Acres orders on the requested voicemail, because pt had called called back and I wanted to get the information to the pt as soon as possible.

## 2014-09-25 NOTE — Telephone Encounter (Signed)
She fractured her second and third metatarsal bases. They are nondisplaced. She will have to wear the Cam Walker for a period of 6-8 weeks. His long as she wears the Cam Walker she should not need pain medication other than Advil or ibuprofen. When she is not ambulating she is to elevate her foot and leg.

## 2014-09-28 ENCOUNTER — Encounter: Payer: Self-pay | Admitting: Family Medicine

## 2014-09-29 NOTE — Telephone Encounter (Signed)
[name],  I know that you were seen in [facility] for this back pain, but it doesn't appear that you followed up with them. Please make an appointment to come in and see me or someone else at the family medicine clinic so that we can evaluate your back pain and treat you appropriately.  Thanks, Dr. Kathrine Cords

## 2014-10-08 ENCOUNTER — Ambulatory Visit (INDEPENDENT_AMBULATORY_CARE_PROVIDER_SITE_OTHER): Payer: 59 | Admitting: Psychology

## 2014-10-08 DIAGNOSIS — F3181 Bipolar II disorder: Secondary | ICD-10-CM

## 2014-10-08 NOTE — Patient Instructions (Addendum)
Please schedule a follow-up for:  April 20th at 9:00.   We are going to write a letter to your supervisor to make our recommendation that your health would benefit from a change to 1st shift and away from patient care or away from patient care with patients that are so ill. Please continue to follow-up with Employee Assistance Program.  This is important for your treatment. We will continue to discuss Lamictal next visit.  You may research it if you would like at [email or web address]  We will get a Depakote level then.

## 2014-10-08 NOTE — Progress Notes (Signed)
[name] presents for follow-up.  Her agenda includes discussing her FMLA paperwork and medication.  We also want to touch base with her EAP appointments.  First off though, we asked about her broken metatarsals - something she doesn't know how she did.  She is in a boot and is frustrated by all of the things that she has to deal with including a painful foot and a big boot.  She is taking Depakote currently on day 2 of 1000 mg.  She denies difficulty tolerating the medicine.  Is taking with food.  In terms of efficacy, she feels less irritable.  She still has noticeable, bothersome depression all but one or two days in the last week.  She noted being more social with roommates.  Staying to herself less often.  Still - feels all alone in the world.  She meets with [name] through EAP on Fridays.  She reports [name] validated that [name]'s life "stinks" and that she is very depressed.  They identified some behavioral things she might be able to do to help improve her mood.  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

## 2014-10-08 NOTE — Assessment & Plan Note (Signed)
Report of mood is depressed.  She is tearful when talking about being alone (unmoored was the term that came to my mind when she spoke of this).  Seems to have a beneficial response to Depakote in terms of less irritability.  Discussed pharmacotherapy to better address the depression.  Lamictal may be a good choice however Claira wants to wait until she is on 1000 mg of Depakote for a time before making another medication adjustment.  Treatment team agreed.  Treatment team thinks her job situation is one of the bigger environmental things that is negatively impacting her.  As Dr. 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  2nd shift is better.  Also, getting away from dying patients that trigger her grief would be beneficial.  Dr. Tammi Klippel agreed to write a letter to HR to see if we could advocate on her behalf.  Dmiyah was to sign a ROI when she left today.    Shaindel was told that the way her FMLA paperwork was written, she was only allowed to be absent two days per week for her mood issue.  Dr. Tammi Klippel will rewrite to give her more flexibility.

## 2014-10-22 ENCOUNTER — Ambulatory Visit (INDEPENDENT_AMBULATORY_CARE_PROVIDER_SITE_OTHER): Payer: 59 | Admitting: Psychology

## 2014-10-22 DIAGNOSIS — F3181 Bipolar II disorder: Secondary | ICD-10-CM

## 2014-10-22 NOTE — Patient Instructions (Signed)
Please schedule a follow-up for:  May 4th at 8:30. One rare but serious side effect for Lamotrigine is a rash.  If you have an unexpected rash, please call me immediately at [phone] or go to a physician. Start the Lamictal by taking 25 mg every other day.  Depakote makes the Lamictal "stronger" so we start even slower than usual. I am going to contact [place] and see what options we might have and then give you a call.   We will send the letter to HR today.

## 2014-10-23 ENCOUNTER — Telehealth: Payer: Self-pay | Admitting: Psychology

## 2014-10-23 NOTE — Telephone Encounter (Signed)
[place] Counselor and left a VM asking her to call me back.

## 2014-10-23 NOTE — Telephone Encounter (Signed)
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  Danton Clap, an Product manager, directed me to [place] that [employer] with to manage these things.  There is a "Request for Reasonable Accommodation Packet" that has forms for Hallee to complete to get this process started.  I sent the forms to [employer] and spoke with her on the phone.  I asked her to call me if she had any difficulty completing the forms.

## 2014-10-23 NOTE — Assessment & Plan Note (Signed)
Report of mood is depressed and irritable.  Affect is the same.  She is very tearful and feeling hopeless.  She has had thoughts of hurting herself.  She denies a plan and intent.  In addition to medication changes, the unresolved grief regarding her mother is taking a major toll and her difficulty at work is compounding things.    Discussed adding Lamotrigine as a way to put a floor on depressive symptoms.  It will take some time to get to a target dose.  Noell was agreeable - somewhat wary of side effects.  Discussed AE including a rash and headache.  Treatment team thinks more intensive outpatient therapy is necessary to help restore a higher level of function.  Mariene is hesitant to scale back at school (despite barely scraping by right now with her functional impairment).  She agreed for me to discuss with her EAP counselor.  ? Whether intensive outpatient or more frequent outpatient therapy appointments are a possibility.  I also said I would follow up with the letter the treatment team wrote recommending a change in Breshay's work.  Will follow up with Renlee when I have some additional information.  See patient instructions for further plan.

## 2014-10-23 NOTE — Progress Notes (Signed)
[name] presents for follow-up.  She reports a very difficult week.  Her mother's birthday was Sunday and she had to work that day.  She has been isolating for at least the last seven days with severe irritable and sad mood.  She was feeling low prior to her mom's birthday though and is not tying this downturn in mood exclusively to this.  She is taking 1000 mg of Depakote and tolerating it well.  She does not feel like it is helping and is feeling sad about this.    She is having nightmares where she awakens in a sweat.  They are about her mother and Nicle's decision to withdraw life support.  She doesn't know what to do with this.  She is meeting with [name] at EAP every other Friday it sounds like.

## 2014-10-24 ENCOUNTER — Telehealth: Payer: Self-pay | Admitting: Psychology

## 2014-10-24 NOTE — Telephone Encounter (Signed)
I exchanged VMs with [name].  I asked Velva Harman to follow up with [name] since I will be on vacation next week.  I alerted [name] and she said she would be willing to coordinate with her.

## 2014-10-24 NOTE — Telephone Encounter (Signed)
Spoke with [name] who saw [name] this morning as part of EAP counseling.    According to [name] was reluctant to start the medicine secondary to the rash that we mentioned and the pharmacist apparently emphasized.  [name] spoke with her about this and [name] reportedly agreed to start the medicine today.    [name] assessed [name] to have a high level of depression and anxiety.  She attempted to get [name] to go to the ED but [name] does not want to because she knows people there.  Museum/gallery conservator agreed to a Surveyor, mining.  [name] did not know about [name]'s Intensive Outpatient Program or how to refer there.  She also doesn't seem to be able to (??) interface with [name] to help facilitate a change in work.  I will follow up with her on that.  I put in a call to [place] IOP coordinator, [name] and asked her to call me back.  I am on vacation next week.  I will email [name], giving her [name]'s name and asking her to help facilitate if I don't hear back from [place] today before 3:00.

## 2014-10-28 ENCOUNTER — Ambulatory Visit: Payer: 59 | Admitting: Podiatry

## 2014-10-31 ENCOUNTER — Encounter (HOSPITAL_COMMUNITY): Payer: Self-pay | Admitting: Emergency Medicine

## 2014-10-31 ENCOUNTER — Emergency Department (HOSPITAL_COMMUNITY)
Admission: EM | Admit: 2014-10-31 | Discharge: 2014-10-31 | Disposition: A | Discharge status: Home / Self Care | Payer: 59 | Attending: Emergency Medicine | Admitting: Emergency Medicine

## 2014-10-31 ENCOUNTER — Emergency Department (HOSPITAL_COMMUNITY): Payer: 59

## 2014-10-31 DIAGNOSIS — R202 Paresthesia of skin: Secondary | ICD-10-CM | POA: Diagnosis not present

## 2014-10-31 DIAGNOSIS — J45909 Unspecified asthma, uncomplicated: Secondary | ICD-10-CM | POA: Insufficient documentation

## 2014-10-31 DIAGNOSIS — R200 Anesthesia of skin: Secondary | ICD-10-CM | POA: Diagnosis not present

## 2014-10-31 DIAGNOSIS — M549 Dorsalgia, unspecified: Secondary | ICD-10-CM

## 2014-10-31 DIAGNOSIS — M545 Low back pain: Secondary | ICD-10-CM | POA: Diagnosis not present

## 2014-10-31 DIAGNOSIS — M546 Pain in thoracic spine: Secondary | ICD-10-CM | POA: Insufficient documentation

## 2014-10-31 DIAGNOSIS — Z79899 Other long term (current) drug therapy: Secondary | ICD-10-CM | POA: Insufficient documentation

## 2014-10-31 DIAGNOSIS — Z8742 Personal history of other diseases of the female genital tract: Secondary | ICD-10-CM | POA: Insufficient documentation

## 2014-10-31 DIAGNOSIS — Z880 Allergy status to penicillin: Secondary | ICD-10-CM | POA: Diagnosis not present

## 2014-10-31 DIAGNOSIS — F319 Bipolar disorder, unspecified: Secondary | ICD-10-CM | POA: Diagnosis not present

## 2014-10-31 LAB — CBC WITH DIFFERENTIAL/PLATELET
BASOS ABS: 0 10*3/uL (ref 0.0–0.1)
Basophils Relative: 0 % (ref 0–1)
Eosinophils Absolute: 0.1 10*3/uL (ref 0.0–0.7)
Eosinophils Relative: 1 % (ref 0–5)
HEMATOCRIT: 36.2 % (ref 36.0–46.0)
Hemoglobin: 12.6 g/dL (ref 12.0–15.0)
LYMPHS ABS: 2.5 10*3/uL (ref 0.7–4.0)
Lymphocytes Relative: 36 % (ref 12–46)
MCH: 31.3 pg (ref 26.0–34.0)
MCHC: 34.8 g/dL (ref 30.0–36.0)
MCV: 89.8 fL (ref 78.0–100.0)
Monocytes Absolute: 0.4 10*3/uL (ref 0.1–1.0)
Monocytes Relative: 6 % (ref 3–12)
Neutro Abs: 4.1 10*3/uL (ref 1.7–7.7)
Neutrophils Relative %: 57 % (ref 43–77)
PLATELETS: 259 10*3/uL (ref 150–400)
RBC: 4.03 MIL/uL (ref 3.87–5.11)
RDW: 12.8 % (ref 11.5–15.5)
WBC: 7.1 10*3/uL (ref 4.0–10.5)

## 2014-10-31 LAB — I-STAT CHEM 8, ED
BUN: 12 mg/dL (ref 6–23)
Calcium, Ion: 1.18 mmol/L (ref 1.12–1.23)
Chloride: 102 mmol/L (ref 96–112)
Creatinine, Ser: 0.6 mg/dL (ref 0.50–1.10)
Glucose, Bld: 80 mg/dL (ref 70–99)
HEMATOCRIT: 38 % (ref 36.0–46.0)
Hemoglobin: 12.9 g/dL (ref 12.0–15.0)
Potassium: 4.5 mmol/L (ref 3.5–5.1)
Sodium: 135 mmol/L (ref 135–145)
TCO2: 22 mmol/L (ref 0–100)

## 2014-10-31 MED ORDER — HYDROCODONE-ACETAMINOPHEN 5-325 MG PO TABS
2.0000 | ORAL_TABLET | Freq: Once | ORAL | Status: DC
  Filled 2014-10-31: qty 2

## 2014-10-31 MED ORDER — LORAZEPAM 2 MG/ML IJ SOLN
2.0000 mg | Freq: Once | INTRAMUSCULAR | Status: AC
  Administered 2014-10-31: 2 mg via INTRAVENOUS
  Filled 2014-10-31: qty 1

## 2014-10-31 MED ORDER — TRAMADOL HCL 50 MG PO TABS: 50.0000 mg | ORAL_TABLET | Freq: Four times a day (QID) | ORAL | Status: DC | PRN

## 2014-10-31 MED ORDER — IBUPROFEN 200 MG PO TABS
600.0000 mg | ORAL_TABLET | Freq: Once | ORAL | Status: DC
  Filled 2014-10-31 (×2): qty 1

## 2014-10-31 MED ORDER — FENTANYL CITRATE (PF) 100 MCG/2ML IJ SOLN
100.0000 ug | Freq: Once | INTRAMUSCULAR | Status: AC
  Administered 2014-10-31: 100 ug via INTRAVENOUS
  Filled 2014-10-31: qty 2

## 2014-10-31 MED ORDER — DIPHENHYDRAMINE HCL 50 MG/ML IJ SOLN: 25.0000 mg | Freq: Four times a day (QID) | INTRAMUSCULAR | Status: DC | PRN

## 2014-10-31 MED ORDER — KETOROLAC TROMETHAMINE 30 MG/ML IJ SOLN
60.0000 mg | Freq: Once | INTRAMUSCULAR | Status: AC
  Administered 2014-10-31: 60 mg via INTRAMUSCULAR
  Filled 2014-10-31: qty 2

## 2014-10-31 MED ORDER — GADOBENATE DIMEGLUMINE 529 MG/ML IV SOLN
20.0000 mL | Freq: Once | INTRAVENOUS | Status: AC | PRN
  Administered 2014-10-31: 20 mL via INTRAVENOUS

## 2014-10-31 MED ORDER — MORPHINE SULFATE 4 MG/ML IJ SOLN
4.0000 mg | Freq: Once | INTRAMUSCULAR | Status: AC
Start: 2014-10-31 — End: 2014-10-31
  Administered 2014-10-31: 4 mg via INTRAVENOUS
  Filled 2014-10-31: qty 1

## 2014-10-31 NOTE — ED Notes (Signed)
Pt c/o back pain x 2-3 days with tingling and numbness to B/L arms onset today. Pt has history of same in past but was never told what was causing it.

## 2014-10-31 NOTE — ED Notes (Signed)
Pt transported to xray 

## 2014-10-31 NOTE — ED Provider Notes (Signed)
CSN: [id number]     Arrival date & time 10/31/14  7564 History   First MD Initiated Contact with Patient 10/31/14 [phone]     Chief Complaint  Patient presents with  . Back Pain  . Tingling     (Consider location/radiation/quality/duration/timing/severity/associated sxs/prior Treatment) HPI Comments: Pt with hx of back pain comes in with back pain. Pt reports having back pain that started 2-3 days ago, unprovoked. Pain is constant, sharp, non radiating. She has intermittent tingling in her fingers and feet. No weakness in her legs or hands. No associated urinary incontinence, urinary retention, bowel incontinence, saddle anesthesia. No recent outdoor activities, tick bites. Pt has hx of bipolar disorder and asthma. She has no auto immune condition, and denies any recent trauma.  Pt has hx of back pain and reports similar symptoms 2 years ago.    Patient is a 29 y.o. female presenting with back pain. The history is provided by the patient.  Back Pain Associated symptoms: numbness   Associated symptoms: no abdominal pain, no chest pain, no dysuria and no headaches     Past Medical History  Diagnosis Date  . Allergy     seasonal allergies  . Pelvic cyst     Seen by gynecologist Dr. Harrington Challenger. Scheduled for ex lap on 12/02/11  . Depression   . Fibroids   . Headache(784.0)     per patient tx for HA  . Bipolar disorder   . Asthma   . History of blood transfusion 11/2011    [facility]    Past Surgical History  Procedure Laterality Date  . Myomectomy  12/02/2011    Procedure: MYOMECTOMY;  Surgeon: Gus Height, MD;  Location: [facility];  Service: Gynecology;  Laterality: N/A;  . Abdominal hysterectomy N/A 11/12/2013    Procedure: Abdominal Hysterectomy with bilateral salpingectomy, and Right oophorectomy;  Surgeon: Janyth Contes, MD;  Location: [facility];  Service: Gynecology;  Laterality: N/A;  . Scar revision N/A 11/12/2013    Procedure: SCAR REVISION;  Surgeon: Janyth Contes, MD;  Location:  [facility];  Service: Gynecology;  Laterality: N/A;   Family History  Problem Relation Age of Onset  . Heart disease Mother    History  Substance Use Topics  . Smoking status: Never Smoker   . Smokeless tobacco: Never Used  . Alcohol Use: No   OB History    Gravida Para Term Preterm AB TAB SAB Ectopic Multiple Living   0              Review of Systems  Respiratory: Negative for shortness of breath.   Cardiovascular: Negative for chest pain.  Gastrointestinal: Negative for abdominal pain.  Genitourinary: Negative for dysuria.  Musculoskeletal: Positive for back pain. Negative for neck pain and neck stiffness.  Neurological: Positive for numbness. Negative for dizziness and headaches.      Allergies  Dilaudid; Clindamycin/lincomycin; and Penicillins  Home Medications   Prior to Admission medications   Medication Sig Start Date End Date Taking? Authorizing Provider  albuterol (PROVENTIL HFA;VENTOLIN HFA) 108 (90 BASE) MCG/ACT inhaler Inhale 2 puffs into the lungs every 4 (four) hours as needed for wheezing or shortness of breath.    Historical Provider, MD  divalproex (DEPAKOTE ER) 500 MG 24 hr tablet Take 1,000 mg by mouth as directed. Per Dr. Tammi Klippel in [facility].  Take 1000 mg daily.  Always with a meal.  #60 with three refills. 10/22/14 02/22/15  Historical Provider, MD  lamoTRIgine (LAMICTAL) 25 MG tablet Take 25 mg  by mouth daily. Take one pill for one week then two pills. 10/22/14   Historical Provider, MD  lidocaine (LIDODERM) 5 % Place 1 patch onto the skin daily. Remove & Discard patch within 12 hours or as directed by MD 08/30/13   Kandis Nab, MD  lurasidone (LATUDA) 40 MG TABS tablet Take 40 mg by mouth daily with breakfast. Per Dr. Tammi Klippel in [facility].  #30 with five refills. 08/20/14 01/19/15  Kandis Nab, MD   BP 120/63 mmHg  Pulse 79  Temp(Src) 98.4 F (36.9 C) (Oral)  Resp 18  Ht 5\' 6"  (1.676 m)  Wt 224 lb (101.606 kg)  BMI 36.17 kg/m2  SpO2 100%  LMP  09/29/2013 Physical Exam  Constitutional: She is oriented to person, place, and time. She appears well-developed and well-nourished.  HENT:  Head: Normocephalic and atraumatic.  Eyes: EOM are normal. Pupils are equal, round, and reactive to light.  Neck: Neck supple.  Cardiovascular: Normal rate, regular rhythm and normal heart sounds.   No murmur heard. Pulmonary/Chest: Effort normal. No respiratory distress.  Abdominal: Soft. She exhibits no distension. There is no tenderness. There is no rebound and no guarding.  Musculoskeletal:  Pt has tenderness over the thoracic region and upper lumbar region - diffuse. No step offs, no erythema. Pt has 2+ patellar and bicepital reflex bilaterally. No hyperreflexia, no clonus Able to discriminate between sharp and dull. Able to ambulate   Neurological: She is alert and oriented to person, place, and time.  Skin: Skin is warm and dry.  Nursing note and vitals reviewed.   ED Course  Procedures (including critical care time) Labs Review Labs Reviewed - No data to display  Imaging Review No results found.   EKG Interpretation None      @ 12 - repeat exam done after neg. xrays as pt complains of persistent pain. She has subjective numbness bilateral hands, but reports L side is worse than R side, and she has subtle weakness with L grip as well - which pt acknowledges. Still has cspine and t spine tenderness, not so much the L spine. Spoke with Dr. Nicole Kindred, Neurology, he recommends MRI C and T spine likely the best diagnostic modality.    @4 :00 MRI is neg. Will d.c  MDM   Final diagnoses:  Back pain    DDx includes: - DJD of the back - Spondylitises/ spondylosis - Sciatica - Spinal cord compression - Conus medullaris - Epidural hematoma - Epidural abscess - Lytic/pathologic fracture - Myelitis - Musculoskeletal pain  Pt comes in with back pain, thoracic region. She has no red flags for cord involvement based on exam. She  has no red flags or risk factors for deep infection of the spine, pathologic fractures. No hyperreflexia, no saddle anesthesia, gross sensory exam appears equal bilaterally and normal.  Will get Xrays.  Based on the hx and exam, no indication for MRI. Unsure what to make of the the numbness to her fingers and feet. They started with the back pain and have not advanced proximally. Myelitis and other cord syndrome considered in the ddx - but pre-test probability really low.   Varney Biles, MD 10/31/14 [phone]

## 2014-10-31 NOTE — ED Notes (Signed)
Pt unable to tolerate PO medication, EDP notified.

## 2014-10-31 NOTE — Discharge Instructions (Signed)
We saw you in the ER for the back pain. All the results in the ER are normal, labs and imaging. We are not sure what is causing your symptoms. The workup in the ER is not complete, and is limited to screening for life threatening and emergent conditions only, so please see a primary care doctor for further evaluation.   Back Pain, Adult Low back pain is very common. About 1 in 5 people have back pain.The cause of low back pain is rarely dangerous. The pain often gets better over time.About half of people with a sudden onset of back pain feel better in just 2 weeks. About 8 in 10 people feel better by 6 weeks.  CAUSES Some common causes of back pain include:  Strain of the muscles or ligaments supporting the spine.  Wear and tear (degeneration) of the spinal discs.  Arthritis.  Direct injury to the back. DIAGNOSIS Most of the time, the direct cause of low back pain is not known.However, back pain can be treated effectively even when the exact cause of the pain is unknown.Answering your caregiver's questions about your overall health and symptoms is one of the most accurate ways to make sure the cause of your pain is not dangerous. If your caregiver needs more information, he or she may order lab work or imaging tests (X-rays or MRIs).However, even if imaging tests show changes in your back, this usually does not require surgery. HOME CARE INSTRUCTIONS For many people, back pain returns.Since low back pain is rarely dangerous, it is often a condition that people can learn to [facility] their own.   Remain active. It is stressful on the back to sit or stand in one place. Do not sit, drive, or stand in one place for more than 30 minutes at a time. Take short walks on level surfaces as soon as pain allows.Try to increase the length of time you walk each day.  Do not stay in bed.Resting more than 1 or 2 days can delay your recovery.  Do not avoid exercise or work.Your body is made to  move.It is not dangerous to be active, even though your back may hurt.Your back will likely heal faster if you return to being active before your pain is gone.  Pay attention to your body when you bend and lift. Many people have less discomfortwhen lifting if they bend their knees, keep the load close to their bodies,and avoid twisting. Often, the most comfortable positions are those that put less stress on your recovering back.  Find a comfortable position to sleep. Use a firm mattress and lie on your side with your knees slightly bent. If you lie on your back, put a pillow under your knees.  Only take over-the-counter or prescription medicines as directed by your caregiver. Over-the-counter medicines to reduce pain and inflammation are often the most helpful.Your caregiver may prescribe muscle relaxant drugs.These medicines help dull your pain so you can more quickly return to your normal activities and healthy exercise.  Put ice on the injured area.  Put ice in a plastic bag.  Place a towel between your skin and the bag.  Leave the ice on for 15-20 minutes, 03-04 times a day for the first 2 to 3 days. After that, ice and heat may be alternated to reduce pain and spasms.  Ask your caregiver about trying back exercises and gentle massage. This may be of some benefit.  Avoid feeling anxious or stressed.Stress increases muscle tension and can worsen  back pain.It is important to recognize when you are anxious or stressed and learn ways to manage it.Exercise is a great option. SEEK MEDICAL CARE IF:  You have pain that is not relieved with rest or medicine.  You have pain that does not improve in 1 week.  You have new symptoms.  You are generally not feeling well. SEEK IMMEDIATE MEDICAL CARE IF:   You have pain that radiates from your back into your legs.  You develop new bowel or bladder control problems.  You have unusual weakness or numbness in your arms or legs.  You  develop nausea or vomiting.  You develop abdominal pain.  You feel faint. Document Released: 06/20/2005 Document Revised: 12/20/2011 Document Reviewed: 10/22/2013 [facility] Patient Information 2015 [place], [place]. This information is not intended to replace advice given to you by your health care provider. Make sure you discuss any questions you have with your health care provider.

## 2014-10-31 NOTE — ED Notes (Signed)
Report given to [place], RN, pt moved to C28.

## 2014-10-31 NOTE — ED Notes (Signed)
EDP at bedside speaking with patient. 

## 2014-10-31 NOTE — ED Notes (Signed)
Called mri for update on scheduling. They advise will be at least an hour. Pt informed

## 2014-10-31 NOTE — ED Notes (Signed)
Pt reports middle/lower back pain. Ongoing x3 days. Also states tingling to hands/feet. 8/10 pain upon arrival to ED. Ambulatory to exam room. Pt is alert and oriented x4.

## 2014-10-31 NOTE — ED Notes (Signed)
MD BY TO DISCUSS PT MRI RESULTS AND ANSWER QUESTIONS.

## 2014-10-31 NOTE — ED Notes (Signed)
Pt up to bathroom in wheelchair, states no relief from pain with medication. EDP aware.

## 2014-11-03 ENCOUNTER — Ambulatory Visit (INDEPENDENT_AMBULATORY_CARE_PROVIDER_SITE_OTHER): Payer: 59 | Admitting: Family Medicine

## 2014-11-03 ENCOUNTER — Encounter: Payer: Self-pay | Admitting: Family Medicine

## 2014-11-03 VITALS — BP 116/75 | HR 77 | Temp 98.6°F | Ht 67.0 in | Wt 225.0 lb

## 2014-11-03 DIAGNOSIS — M549 Dorsalgia, unspecified: Secondary | ICD-10-CM | POA: Diagnosis not present

## 2014-11-03 DIAGNOSIS — O926 Galactorrhea: Secondary | ICD-10-CM | POA: Diagnosis not present

## 2014-11-03 DIAGNOSIS — N643 Galactorrhea not associated with childbirth: Secondary | ICD-10-CM

## 2014-11-03 MED ORDER — METHOCARBAMOL 500 MG PO TABS: 500.0000 mg | ORAL_TABLET | Freq: Four times a day (QID) | ORAL | Status: DC | PRN

## 2014-11-03 NOTE — Telephone Encounter (Signed)
This email was sent to [employer] and I was copied on it.  This is from [name] who was following up on this while I was out of town:  I finally connected with Dr. Gwenlyn Saran about her recommendations for your ongoing treatment.  I explained you were to begin taking the medication that day we met versus Saturday and she agreed with that.  She also spoke with Dr. Tammi Klippel and they are encouraging you to consider an Intensive Outpatient program through [facility], which is in no way connected to the ED at [facility].  I spoke with Dellia Nims there who is the case manager and she mentioned your calling to obtain more information about this program and how much your insurance will cover.  It will involve 2 weeks of 9-12N - groups mostly made up of other patients with depression/anxiety/mood disorders, and who are struggling with a great deal of stress and grief and loss in their lives.  You may need to take leave for this, but it will be worthwhile to help you stabilize.  Please call [name] about this for more information.  I am passing this information on to you and copying this to Dr. Gwenlyn Saran as well, while she is out of the office.  [name] can be reached best in the afternoons at [phone].  They do have openings beginning May 4th or 5th at this time and it is a self-referral program - you do not need  a doctor to refer.    I will speak with you further about this when we meet next later this week.

## 2014-11-03 NOTE — Patient Instructions (Signed)
It was good to see you again. I'm sorry that your back pain is so bothersome. I have prescribed you Robaxin to help with the pain. Please follow up with me in 1 month.  Back Exercises Back exercises help treat and prevent back injuries. The goal is to increase your strength in your belly (abdominal) and back muscles. These exercises can also help with flexibility. Start these exercises when told by your doctor. HOME CARE Back exercises include: Pelvic Tilt.  Lie on your back with your knees bent. Tilt your pelvis until the lower part of your back is against the floor. Hold this position 5 to 10 sec. Repeat this exercise 5 to 10 times. Knee to Chest.  Pull 1 knee up against your chest and hold for 20 to 30 seconds. Repeat this with the other knee. This may be done with the other leg straight or bent, whichever feels better. Then, pull both knees up against your chest. Sit-Ups or Curl-Ups.  Bend your knees 90 degrees. Start with tilting your pelvis, and do a partial, slow sit-up. Only lift your upper half 30 to 45 degrees off the floor. Take at least 2 to 3 seonds for each sit-up. Do not do sit-ups with your knees out straight. If partial sit-ups are difficult, simply do the above but with only tightening your belly (abdominal) muscles and holding it as told. Hip-Lift.  Lie on your back with your knees flexed 90 degrees. Push down with your feet and shoulders as you raise your hips 2 inches off the floor. Hold for 10 seconds, repeat 5 to 10 times. Back Arches.  Lie on your stomach. Prop yourself up on bent elbows. Slowly press on your hands, causing an arch in your low back. Repeat 3 to 5 times. Shoulder-Lifts.  Lie face down with arms beside your body. Keep hips and belly pressed to floor as you slowly lift your head and shoulders off the floor. Do not overdo your exercises. Be careful in the beginning. Exercises may cause you some mild back discomfort. If the pain lasts for more than 15  minutes, stop the exercises until you see your doctor. Improvement with exercise for back problems is slow.  Document Released: 07/23/2010 Document Revised: 09/12/2011 Document Reviewed: 04/21/2011 [facility] Patient Information 2015 [place], [place]. This information is not intended to replace advice given to you by your health care provider. Make sure you discuss any questions you have with your health care provider.

## 2014-11-03 NOTE — Assessment & Plan Note (Addendum)
Patient having back pain in the cervical and thoracic region without an unknown etiology. The patient states that she had pain somewhat to this back in 2014. On exam, patient's pain seems to be both over the spinous processes and over the paraspinal muscles, suggesting more muscular tension band bone or nerve damage. Workup from the emergency room 3 days ago revealed a negative x-ray and negative MRI. No red flags on exam or history. Patient states that she has been trying to do the stretches that were suggested back in 2014 by physical therapy.  Patient also endorses numbness and tingling of her hands and feet bilaterally. This has been stable for the last 2 weeks. Once again, this is similar to what she had in 2014. CBGs in the emergency room were within normal limits. The patient has recently started on Depakote. His could also be secondary to anxiety, given the patient's history. Discussed that if this continued to progress, she should return to clinic. -Discussed stretches with the patient -Patient can heat and ice to help with pain -Start Robaxin for muscle spasms, discussed potential side effects such as drowsiness -Patient to follow-up with me in one month if symptoms have not improved -Return to clinic precautions provided

## 2014-11-03 NOTE — Progress Notes (Signed)
Patient ID: [name], female   DOB: 14-Sep-1985, 29 y.o.   MRN: [id number]    Subjective: CC:f/u back pain HPI: Patient is a 29 y.o. female with a past medical history of back pain, obesisty, paresthesias, and bipolar disorder  presenting to clinic today for back pain.  She has had pain in the thoracic and cervical pain x 2 weeks that is progressively worsened. The pain is stabbing/throbbing/aching in nature. No radiation. The pain is constant in nature. Movement or standing for long periods of times makes the pain worse. Propping things on her back helps some. No h/o trauma. She has not tried heat/ice.  Tramadol prescribed in the ED on 4/29 was not helpful. She also endorses tingling of her hands and feet bilaterally that began approximately 2 weeks ago. No aggravating factors. Moving her hands can sometimes improve this. The area of tingling has not spread proximally. Notes she's had this in the past (associated with perioral numbness as well) along with galactorrhea (this has been worked up). Patient recently started on Depakote.  She also had an xray in the ED which revealed: Normal lumbar segmentation. Bone mineralization is within normal limits. Stable lumbar vertebral height and alignment, mild straightening of lordosis. Disc spaces are preserved. Negative visualized lower thoracic levels incidental unfused left L1 transverse process ossification center. No pars fracture. Sacral ala and SI joints within normal limits.Negative radiographic appearance of the thoracic spine; mild levoconvex scoliosis.  MRI revealed: No acute or significant finding of the cervical or thoracic spine. No cause of severe back pain identified (questionable thyromegaly).   Social History: Mother passed away prematurely due to cancer. Pt very anxious.   Health Maintenance: Pt advised to f/u for a pap smear.  ROS: All other systems reviewed and are negative   Past Medical History Patient Active Problem List   Diagnosis Date Noted  . Back pain 11/03/2014  . Pelvic abscess in female 11/24/2013  . S/P TAH (total abdominal hysterectomy) 11/12/2013  . Fibroids, intramural 11/11/2013  . Leiomyoma of uterus 10/06/2013  . Bipolar 2 disorder 09/18/2013  . Morbid obesity 08/30/2013  . Low back pain 02/09/2013  . Tooth pain 01/12/2013  . Strain of iliopsoas muscle 12/04/2012  . Galactorrhea 07/12/2012  . Migraine headache 07/12/2012  . Paresthesia 07/12/2012  . RHINITIS, ALLERGIC 08/31/2006  . MENSTRUATION, PAINFUL 08/31/2006    Medications- reviewed and updated Current Outpatient Prescriptions  Medication Sig Dispense Refill  . albuterol (PROVENTIL HFA;VENTOLIN HFA) 108 (90 BASE) MCG/ACT inhaler Inhale 2 puffs into the lungs every 4 (four) hours as needed for wheezing or shortness of breath.    . divalproex (DEPAKOTE ER) 500 MG 24 hr tablet Take 1,000 mg by mouth as directed. Per Dr. Tammi Klippel in [facility].  Take 1000 mg daily.  Always with a meal.  #60 with three refills.    . lidocaine (LIDODERM) 5 % Place 1 patch onto the skin daily. Remove & Discard patch within 12 hours or as directed by MD (Patient not taking: Reported on 10/31/2014) 30 patch 1  . methocarbamol (ROBAXIN) 500 MG tablet Take 1 tablet (500 mg total) by mouth every 6 (six) hours as needed for muscle spasms. 45 tablet 0  . traMADol (ULTRAM) 50 MG tablet Take 1 tablet (50 mg total) by mouth every 6 (six) hours as needed. 15 tablet 0   No current facility-administered medications for this visit.    Objective: Office vital signs reviewed. BP 116/75 mmHg  Pulse 77  Temp(Src) 98.6 F (  37 C) (Oral)  Ht 5\' 7"  (1.702 m)  Wt 225 lb (102.059 kg)  BMI 35.23 kg/m2  LMP 09/29/2013   Physical Examination:  General: Awake, alert, anxious ENMT:  No LAD or thyromegaly noted. Cardio: RRR, no m/r/g noted.  Pulm: No increased WOB.  CTAB, without wheezes, rhonchi or crackles noted.  Breast: Able to express out clear fluid.  MSK: No step offs. TTP  over the thoracic and cervical spinous processes and paraspinal muscles, most prominent around T4. Negative SLR.  Skin: dry, intact, no rashes or lesions Neuro: Strength and sensation grossly intact, DTRs 2/4 in biceps and patella. 5/5 strength in the upper extremities b/l (slightly stronger in the R>L, however pt R handed).   Assessment/Plan: Back pain Patient having back pain in the cervical and thoracic region without an unknown etiology. The patient states that she had pain somewhat to this back in 2014. On exam, patient's pain seems to be both over the spinous processes and over the paraspinal muscles, suggesting more muscular tension band bone or nerve damage. Workup from the emergency room 3 days ago revealed a negative x-ray and negative MRI. No red flags on exam or history. Patient states that she has been trying to do the stretches that were suggested back in 2014 by physical therapy.  Patient also endorses numbness and tingling of her hands and feet bilaterally. This has been stable for the last 2 weeks. Once again, this is similar to what she had in 2014. CBGs in the emergency room were within normal limits. The patient has recently started on Depakote. His could also be secondary to anxiety, given the patient's history. Discussed that if this continued to progress, she should return to clinic. -Discussed stretches with the patient -Patient can heat and ice to help with pain -Start Robaxin for muscle spasms, discussed potential side effects such as drowsiness -Patient to follow-up with me in one month if symptoms have not improved -Return to clinic precautions provided   Galactorrhea Clear drainage was able to be expressed on today's exam. No headaches or vision changes currently. In the past, patient has had prolactin level which was normal and a normal MRI of the brain to rule out a prolactinoma. The patient was referred for mammogram and ultrasound, however this was not done as the  physician for which she was referred performed a physical exam and stated this was a benign and required no further workup. - Discussed weight loss as, her obesity leading to increased estrogen levels could be partially to blame for this. -Continue to follow-up     No orders of the defined types were placed in this encounter.    Meds ordered this encounter  Medications  . methocarbamol (ROBAXIN) 500 MG tablet    Sig: Take 1 tablet (500 mg total) by mouth every 6 (six) hours as needed for muscle spasms.    Dispense:  45 tablet    Refill:  [place] PGY-1, [place]

## 2014-11-03 NOTE — Assessment & Plan Note (Signed)
Clear drainage was able to be expressed on today's exam. No headaches or vision changes currently. In the past, patient has had prolactin level which was normal and a normal MRI of the brain to rule out a prolactinoma. The patient was referred for mammogram and ultrasound, however this was not done as the physician for which she was referred performed a physical exam and stated this was a benign and required no further workup. - Discussed weight loss as, her obesity leading to increased estrogen levels could be partially to blame for this. -Continue to follow-up

## 2014-11-05 ENCOUNTER — Ambulatory Visit (INDEPENDENT_AMBULATORY_CARE_PROVIDER_SITE_OTHER): Payer: 59 | Admitting: Psychology

## 2014-11-05 DIAGNOSIS — F3181 Bipolar II disorder: Secondary | ICD-10-CM | POA: Diagnosis not present

## 2014-11-05 NOTE — Progress Notes (Signed)
[name] presents for follow-up.  She does not have anything for the agenda.  Our agenda includes:  - A check on medicine:  Tolerating the Lamictal fine.  Taking 25 mg every other day as prescribed as well as the Depakote.  No missed doses reported.  No efficacy noted.  - Progress on work place accommodation:  She has had difficulty filling out the paperwork.  Assessed barriers.  One is that she is getting feedback about 4-5 jobs and is holding off on the paperwork to see if one of these jobs goes through.  All but one are 1st shift and out of direct patient care.  - Thoughts about the intensive outpatient program we discussed:  $1200 out of pocket as best she can figure and it interferes with school.  She is not doing well in school but her advisor thinks she should try to finish the semester which ends in June.  - Her EAP sessions:  Missed the last one reportedly due to fatigue.  Has another one tomorrow.  Gets "mad" after the sessions because of the things that she talks about (tends to trigger her).

## 2014-11-05 NOTE — Patient Instructions (Signed)
Please schedule a follow-up for:  June 1st at 9:00.   I scheduled an appointment with you with Dr. Lorenso Courier for 5/20 at 8:30.  She will check on tolerability, safety, and how you are doing.  If this is going well, she will ask you to increase your Lamictal to 50 mg a day.   FOR NOW:  Start taking the Lamictal - one pill (25 mg) every day until you see Dr. Lorenso Courier.  Keep the Depakote the same. Hope that the job situation comes through.  If not - please finish the paperwork.  You can send to me. You agreed to call Dorris Fetch to check on kayaking.  This is a commitment you made for your wellness.  You may not feel like going.  Go.  And then see how you feel.

## 2014-11-05 NOTE — Assessment & Plan Note (Signed)
Report of mood remains depressed and irritable.  About 6 out of the last 14 days were depressed.  Almost everyday seems to lack motivation or pleasure.  No evidence of SI / HI.  She was able to laugh and her energy seemed to get a little higher over the course of the appointment.  We did not have space in two weeks to see her again.  Scheduled her with her PCP to further titrate her Lamictal.    Looked at [place].  Sofija was able to identify something she could do that she thinks might have a positive effect on her mood.  See patient instructions for further plan.

## 2014-11-06 ENCOUNTER — Ambulatory Visit (INDEPENDENT_AMBULATORY_CARE_PROVIDER_SITE_OTHER): Payer: 59

## 2014-11-06 ENCOUNTER — Ambulatory Visit (INDEPENDENT_AMBULATORY_CARE_PROVIDER_SITE_OTHER): Payer: 59 | Admitting: Podiatry

## 2014-11-06 ENCOUNTER — Encounter: Payer: Self-pay | Admitting: Podiatry

## 2014-11-06 VITALS — BP 111/71 | HR 82 | Resp 16

## 2014-11-06 DIAGNOSIS — S92302D Fracture of unspecified metatarsal bone(s), left foot, subsequent encounter for fracture with routine healing: Secondary | ICD-10-CM | POA: Diagnosis not present

## 2014-11-06 NOTE — Progress Notes (Signed)
She presents today for follow-up of a transverse fracture of the second and third metatarsals of the left foot. She states that they're still painful but they're better than they were. She continues to wear the cam walker regularly.  Objective: Vital signs are stable she is alert and oriented 3. Pulses are palpable left foot. She has much decreased in edema no ecchymosis no erythema saline as drainage or odor left foot. She has mild tenderness on palpation of the base of the second and third metatarsals of the left foot. I see no signs of infection. Radiographs taken today demonstrate well-healing fractures.  Assessment: Well-healing fractures bases of the second and third metatarsals left foot.  Plan: She will continue to use the Cam Walker again for another month I will follow-up with her that time for her final set of x-rays.

## 2014-11-21 ENCOUNTER — Ambulatory Visit (INDEPENDENT_AMBULATORY_CARE_PROVIDER_SITE_OTHER): Payer: 59 | Admitting: Family Medicine

## 2014-11-21 ENCOUNTER — Encounter: Payer: Self-pay | Admitting: Family Medicine

## 2014-11-21 VITALS — BP 105/55 | HR 86 | Temp 99.5°F | Ht 67.0 in | Wt 244.0 lb

## 2014-11-21 DIAGNOSIS — F3181 Bipolar II disorder: Secondary | ICD-10-CM | POA: Diagnosis not present

## 2014-11-21 DIAGNOSIS — R062 Wheezing: Secondary | ICD-10-CM | POA: Diagnosis not present

## 2014-11-21 MED ORDER — ALBUTEROL SULFATE HFA 108 (90 BASE) MCG/ACT IN AERS: 2.0000 | INHALATION_SPRAY | RESPIRATORY_TRACT | Status: AC | PRN

## 2014-11-21 NOTE — Patient Instructions (Signed)
I am so glad that you went kayaking! For now, start taking Lamictal 25mg  daily, when you meet with Dr. Gwenlyn Saran she may decide to increase you to 50mg  daily. Starting using the albuterol inhaler for wheezing and cough. If you notice that you have a fever, shortness of breath (more than just your deconditioning), difficulty breathing, chest pain, or worsening cough, please seek medical attention.

## 2014-11-21 NOTE — Progress Notes (Signed)
Patient ID: [name], female   DOB: [birth year], 29 y.o.   MRN: [id number]    Subjective: CC:f/u bipolar 2 and medication titration  HPI: Patient is a 29 y.o. female with a past medical history of bipolar 2 presenting to clinic today for a f/u of her bipolar 2 with possible medcation titration.  BIPOLAR 2 The patient is currently taking Lamictal 25 mg every other day. She was instructed at her previous visit with Dr. Gwenlyn Saran to increase to daily, however this was misunderstood. She states she's been doing okay with the current regimen. She endorses some acid reflux initially while taking the pill, however this resolves almost immediately after. No other GI upset, fatigue, dizziness, or rash. She does endorse some continued insomnia, which may be secondary to her work schedule. Her stated mood is "irritable."  She has not seen her therapist in several weeks, due to confusion over appointment scheduling and frustration. She tells me she was able to go kayaking in [place] and seems happy when she tells me that it was fun but difficult.  She will start thinking about other things she can do to get her more physical activity out of the home. She also tells me that she is having more issues at work. She can't miss any more days this month or she will be in corrective action.  Per her report, the way that Dr. Tammi Klippel has her paperwork filled out, if she misses one day of work she must missed 2, and she is only allowed to miss 4 days of work per week. She has met his maximum already in May.   During my exam, I notice wheezing in the upper lobes bilaterally. When I discussed this, she states that she has had some sneezing and coughing and some central/right sided chest pressure for the last few days. She states that there are no triggers, however it sometimes comes when she sitting down. It is worsened by cough and by palpation. She occasionally has some shortness of breath which she attributes to her  deconditioning, as it's mostly with exertion.  No fevers. The patient continues to have night sweats, which she states is her baseline. No watery eyes or ear pain.  She tells me she has had a negative TB test in the past.    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 She is attempting to finish out this semester and pass.   Health Maintenance: This is status post hysterectomy and no longer needs Pap smears.   ROS: All other systems reviewed and are negative.  Past Medical History Patient Active Problem List   Diagnosis Date Noted  . Wheeze 11/21/2014  . Back pain 11/03/2014  . Pelvic abscess in female 11/24/2013  . S/P TAH (total abdominal hysterectomy) 11/12/2013  . Fibroids, intramural 11/11/2013  . Leiomyoma of uterus 10/06/2013  . Bipolar 2 disorder 09/18/2013  . Morbid obesity 08/30/2013  . Low back pain 02/09/2013  . Tooth pain 01/12/2013  . Strain of iliopsoas muscle 12/04/2012  . Galactorrhea 07/12/2012  . Migraine headache 07/12/2012  . Paresthesia 07/12/2012  . RHINITIS, ALLERGIC 08/31/2006  . MENSTRUATION, PAINFUL 08/31/2006    Medications- reviewed and updated Current Outpatient Prescriptions  Medication Sig Dispense Refill  . albuterol (PROVENTIL HFA;VENTOLIN HFA) 108 (90 BASE) MCG/ACT inhaler Inhale 2 puffs into the lungs every 4 (four) hours as  needed for wheezing or shortness of breath. 3.7 g 1  . divalproex (DEPAKOTE ER) 500 MG 24 hr tablet Take 1,000 mg by mouth as directed. Per Dr. Tammi Klippel in [facility].  Take 1000 mg daily.  Always with a meal.  #60 with three refills.    . lamoTRIgine (LAMICTAL) 25 MG tablet Take 50 mg by mouth daily. Per Dr. Tammi Klippel in [facility].  She should increase to 25 mg every day for two weeks until seen by PCP.    Marland Kitchen lidocaine (LIDODERM) 5 % Place 1 patch onto the skin daily. Remove & Discard patch  within 12 hours or as directed by MD (Patient not taking: Reported on 10/31/2014) 30 patch 1  . methocarbamol (ROBAXIN) 500 MG tablet Take 1 tablet (500 mg total) by mouth every 6 (six) hours as needed for muscle spasms. 45 tablet 0  . traMADol (ULTRAM) 50 MG tablet Take 1 tablet (50 mg total) by mouth every 6 (six) hours as needed. 15 tablet 0   No current facility-administered medications for this visit.    Objective: Office vital signs reviewed. BP 105/55 mmHg  Pulse 86  Temp(Src) 99.5 F (37.5 C) (Oral)  Ht 5' 7" (1.702 m)  Wt 244 lb (110.678 kg)  BMI 38.21 kg/m2  LMP 09/29/2013   Physical Examination:  General: Awake, alert, well- nourished, NAD ENMT:  TMs intact, normal light reflex, no erythema, no bulging. Nasal turbinates moist. MMM, Oropharynx clear without erythema or tonsillar exudate/hypertrophy Eyes: Conjunctiva non-injected.  Cardio: RRR, no m/r/g noted. Pain slightly reproducible with palpation per patient. Pulm: No increased WOB.  Expiratory wheezes noted in the upper lobes bilaterally. No rhonchi or crackles noted. GI: soft, NT/ND,+BS x4, no hepatomegaly, no splenomegaly MSK: Normal gait and station Skin: dry, intact, no rashes or lesions Neuro: DTRs 2/4 Psych: Initially blunted affect, however becomes more talkative with more expression when we discussed kayaking. Stated mood "irritable." No SI/HI/AVH  Assessment/Plan: Bipolar 2 disorder Patient continues to report her mood as irritable, and over the last few weeks she's noticed that she has been "more irritable than she thought." Cannot find specific triggers, however has not been attending counseling. She denies any true side effects of Lamictal. She continues to lack motivation at work and at school., However she does endorse the hope of getting through her health management degrees to that she can get out of the patient care aspect of medicine. No stated SI, HI, or AVH. Throughout the appointment, she did begin  to left, smile, and be more energetic. She seemed to take pleasure in kayaking.   - I congratulated her on going [place], and urged her to find another task to complete. -Continue to urge patient to force herself to get out of bed to go to work, as sometimes this can be more helpful than staying in bed all day. -The patient has currently been taking Lamictal 25 mg every other day instead of daily. She will increase to 25 mg daily until she meets with Dr. Gwenlyn Saran in [facility] as long as side effects will allow. - The patient will follow-up with Dr. Gwenlyn Saran on June 1.   Wheeze Patient noted to have a wheeze on exam, she also endorses cough, and sneezing. She denies any other URI or allergic rhinitis type symptoms. Per her report, she does have a history of asthma with use of an albuterol inhaler.  From reviewing the EMR, it appears like she has been diagnosed with allergic rhinitis in the past, but I do  not see diagnosis of asthma.  Considering the patient has been on Lamictal 25 mg every other day, I do not feel that this is secondary to the medication. No crackles, rhonchi, or increased work of breathing noted on exam. - We will re prescribe the albuterol inhaler. -She also has a history of pneumonia in the past. Currently no signs of pneumonia on exam. -Discussed return to clinic precautions: Differing chest pain, chest position of diaphoresis, worsening cough, shortness of breath, difficulty breathing, or fever. She voiced understanding      No orders of the defined types were placed in this encounter.    Meds ordered this encounter  Medications  . albuterol (PROVENTIL HFA;VENTOLIN HFA) 108 (90 BASE) MCG/ACT inhaler    Sig: Inhale 2 puffs into the lungs every 4 (four) hours as needed for wheezing or shortness of breath.    Dispense:  3.7 g    Refill:  [place] PGY-1, [place]

## 2014-11-21 NOTE — Assessment & Plan Note (Signed)
Patient noted to have a wheeze on exam, she also endorses cough, and sneezing. She denies any other URI or allergic rhinitis type symptoms. Per her report, she does have a history of asthma with use of an albuterol inhaler.  From reviewing the EMR, it appears like she has been diagnosed with allergic rhinitis in the past, but I do not see diagnosis of asthma.  Considering the patient has been on Lamictal 25 mg every other day, I do not feel that this is secondary to the medication. No crackles, rhonchi, or increased work of breathing noted on exam. - We will re prescribe the albuterol inhaler. -She also has a history of pneumonia in the past. Currently no signs of pneumonia on exam. -Discussed return to clinic precautions: Differing chest pain, chest position of diaphoresis, worsening cough, shortness of breath, difficulty breathing, or fever. She voiced understanding

## 2014-11-21 NOTE — Assessment & Plan Note (Signed)
Patient continues to report her mood as irritable, and over the last few weeks she's noticed that she has been "more irritable than she thought." Cannot find specific triggers, however has not been attending counseling. She denies any true side effects of Lamictal. She continues to lack motivation at work and at school., However she does endorse the hope of getting through her health management degrees to that she can get out of the patient care aspect of medicine. No stated SI, HI, or AVH. Throughout the appointment, she did begin to left, smile, and be more energetic. She seemed to take pleasure in kayaking.   - I congratulated her on going [place], and urged her to find another task to complete. -Continue to urge patient to force herself to get out of bed to go to work, as sometimes this can be more helpful than staying in bed all day. -The patient has currently been taking Lamictal 25 mg every other day instead of daily. She will increase to 25 mg daily until she meets with Dr. Gwenlyn Saran in [facility] as long as side effects will allow. - The patient will follow-up with Dr. Gwenlyn Saran on June 1.

## 2014-12-03 ENCOUNTER — Ambulatory Visit: Payer: 59 | Admitting: Psychology

## 2014-12-03 ENCOUNTER — Telehealth: Payer: Self-pay | Admitting: Psychology

## 2014-12-03 NOTE — Telephone Encounter (Signed)
[name] did not show for her follow-up.  Reviewed Dr. Libby Maw note from a few weeks ago and called the patient.  She was sleeping.  She apologized for missing the appointment and stated that she would like to reschedule.  Did so for June 15th at 11:30.    She said she is no longer seeing the therapist from [facility] secondary to scheduling issues and feeling frustrated.  I asked if she thought she needed to see someone and she said she did.  Encouraged her to call [facility] and check in with their outpatient department.    She said she would like to complete the paperwork for workplace accommodation but doesn't want to "mess it up."  I encouraged her to do what she could and send it to me for my review.

## 2014-12-04 ENCOUNTER — Ambulatory Visit: Payer: 59 | Admitting: Podiatry

## 2014-12-17 ENCOUNTER — Ambulatory Visit (INDEPENDENT_AMBULATORY_CARE_PROVIDER_SITE_OTHER): Payer: 59 | Admitting: Psychology

## 2014-12-17 DIAGNOSIS — F3181 Bipolar II disorder: Secondary | ICD-10-CM | POA: Diagnosis not present

## 2014-12-17 NOTE — Progress Notes (Signed)
[name] presented for follow-up.  She reported she is taking 25 mg of Lamictal daily since her appointment with Dr. Lorenso Courier in May.  She reports she has missed four doses in three weeks because she was too tired to get up from bed to take the medicine.  She reports she continues to take Depakote as prescribed.  [name] reports moderate depressed mood every day for the last week.  She withdrew from all but one of her classes at school because she was distracted / unable to focus.  She got in "trouble" with her manger at work for having an "attitude."  She notes one time last week she was severely depressed and stayed in bed for about 4 days.    She is not attending therapy.

## 2014-12-17 NOTE — Patient Instructions (Signed)
Please schedule a follow up for:  7/6 at 9:00.   I will call you the week of the 27th to check-in.  Please call or schedule an appointment with Dr. Lorenso Courier if you think you need to be seen sooner than that. Dr. Tammi Klippel recommended you increase your Lamictal to 50 mg a day which is two pills.   If you have questions, please call me at [phone].

## 2014-12-17 NOTE — Assessment & Plan Note (Signed)
Report of mood is depressed.  Affect is flat.  She is tearful occasionally.  She seems resistant to anything but medication right now and is not taking that 100% of the time.  Using MI strategies, broached the topic of therapy.  She sounded onboard (no disadvantages noted; thought she should get a therapist) until I tried to pinpoint when she would call to schedule the appointment.  It was then that she said she didn't want to go.  Looked at the possibility of behavioral activation during periods of severely low mood.  She was resistant to this as well stating she just "doesn't care."  Assessed for suicidal ideation.  Admits to passive ideation.  Denies active ideation, plan, and intent.  Does not know what she would do if she started having more active ideation.  Problem solving around this was not fruitful either.  Dr. Tammi Klippel is hoping that the increased dose of Lamictal to the target range of 100 mg might help with the depression.  Treatment team wonders about personality characteristics as playing a role as well.  She is not appear motivated currently to engage in any activities that might help her depression (with the exception of taking a medication).  See patient instructions for further plan.

## 2014-12-29 ENCOUNTER — Telehealth: Payer: Self-pay | Admitting: Psychology

## 2014-12-29 NOTE — Telephone Encounter (Signed)
Called [name] and left a VM to see how she is doing.  Asked her to call me back.

## 2014-12-30 ENCOUNTER — Encounter (HOSPITAL_COMMUNITY)
Admission: AD | Disposition: A | Discharge status: Home / Self Care | Payer: Self-pay | Source: Ambulatory Visit | Attending: Obstetrics and Gynecology

## 2014-12-30 ENCOUNTER — Inpatient Hospital Stay (HOSPITAL_COMMUNITY): Payer: 59 | Admitting: Registered Nurse

## 2014-12-30 ENCOUNTER — Ambulatory Visit (HOSPITAL_COMMUNITY)
Admission: AD | Admit: 2014-12-30 | Discharge: 2014-12-30 | Disposition: A | Discharge status: Home / Self Care | Payer: 59 | Source: Ambulatory Visit | Attending: Obstetrics and Gynecology | Admitting: Obstetrics and Gynecology

## 2014-12-30 ENCOUNTER — Encounter (HOSPITAL_COMMUNITY): Payer: Self-pay | Admitting: *Deleted

## 2014-12-30 ENCOUNTER — Inpatient Hospital Stay (HOSPITAL_COMMUNITY): Payer: 59

## 2014-12-30 DIAGNOSIS — Z9079 Acquired absence of other genital organ(s): Secondary | ICD-10-CM | POA: Diagnosis not present

## 2014-12-30 DIAGNOSIS — Z79899 Other long term (current) drug therapy: Secondary | ICD-10-CM | POA: Insufficient documentation

## 2014-12-30 DIAGNOSIS — Z90710 Acquired absence of both cervix and uterus: Secondary | ICD-10-CM | POA: Insufficient documentation

## 2014-12-30 DIAGNOSIS — Z90721 Acquired absence of ovaries, unilateral: Secondary | ICD-10-CM | POA: Insufficient documentation

## 2014-12-30 DIAGNOSIS — R109 Unspecified abdominal pain: Secondary | ICD-10-CM

## 2014-12-30 DIAGNOSIS — J45909 Unspecified asthma, uncomplicated: Secondary | ICD-10-CM | POA: Diagnosis not present

## 2014-12-30 DIAGNOSIS — R102 Pelvic and perineal pain: Secondary | ICD-10-CM

## 2014-12-30 DIAGNOSIS — F319 Bipolar disorder, unspecified: Secondary | ICD-10-CM | POA: Diagnosis not present

## 2014-12-30 DIAGNOSIS — N8320 Unspecified ovarian cysts: Secondary | ICD-10-CM | POA: Insufficient documentation

## 2014-12-30 HISTORY — DX: Unspecified ovarian cyst, unspecified side: N83.209

## 2014-12-30 HISTORY — PX: LAPAROSCOPY: SHX197

## 2014-12-30 LAB — URINALYSIS, ROUTINE W REFLEX MICROSCOPIC
Bilirubin Urine: NEGATIVE
Glucose, UA: NEGATIVE mg/dL
Hgb urine dipstick: NEGATIVE
KETONES UR: NEGATIVE mg/dL
Leukocytes, UA: NEGATIVE
Nitrite: NEGATIVE
PROTEIN: NEGATIVE mg/dL
SPECIFIC GRAVITY, URINE: 1.015 (ref 1.005–1.030)
UROBILINOGEN UA: 0.2 mg/dL (ref 0.0–1.0)
pH: 7.5 (ref 5.0–8.0)

## 2014-12-30 LAB — COMPREHENSIVE METABOLIC PANEL
ALK PHOS: 61 U/L (ref 38–126)
ALT: 12 U/L — ABNORMAL LOW (ref 14–54)
ANION GAP: 6 (ref 5–15)
AST: 21 U/L (ref 15–41)
Albumin: 4 g/dL (ref 3.5–5.0)
BUN: 10 mg/dL (ref 6–20)
CALCIUM: 9.5 mg/dL (ref 8.9–10.3)
CO2: 21 mmol/L — AB (ref 22–32)
Chloride: 105 mmol/L (ref 101–111)
Creatinine, Ser: 0.64 mg/dL (ref 0.44–1.00)
GLUCOSE: 93 mg/dL (ref 65–99)
POTASSIUM: 4.4 mmol/L (ref 3.5–5.1)
Sodium: 132 mmol/L — ABNORMAL LOW (ref 135–145)
TOTAL PROTEIN: 7.5 g/dL (ref 6.5–8.1)
Total Bilirubin: 0.8 mg/dL (ref 0.3–1.2)

## 2014-12-30 LAB — CBC
HCT: 36.3 % (ref 36.0–46.0)
Hemoglobin: 12.8 g/dL (ref 12.0–15.0)
MCH: 32.6 pg (ref 26.0–34.0)
MCHC: 35.3 g/dL (ref 30.0–36.0)
MCV: 92.4 fL (ref 78.0–100.0)
PLATELETS: 286 10*3/uL (ref 150–400)
RBC: 3.93 MIL/uL (ref 3.87–5.11)
RDW: 12.5 % (ref 11.5–15.5)
WBC: 7.1 10*3/uL (ref 4.0–10.5)

## 2014-12-30 SURGERY — LAPAROSCOPY OPERATIVE
Anesthesia: General | Site: Abdomen

## 2014-12-30 MED ORDER — ONDANSETRON HCL 4 MG/2ML IJ SOLN
INTRAMUSCULAR | Status: DC | PRN
  Administered 2014-12-30: 4 mg via INTRAVENOUS

## 2014-12-30 MED ORDER — KETOROLAC TROMETHAMINE 30 MG/ML IJ SOLN
30.0000 mg | Freq: Once | INTRAMUSCULAR | Status: AC
  Administered 2014-12-30: 30 mg via INTRAVENOUS
  Filled 2014-12-30: qty 1

## 2014-12-30 MED ORDER — HYDROMORPHONE HCL 1 MG/ML IJ SOLN
2.0000 mg | Freq: Once | INTRAMUSCULAR | Status: AC
  Administered 2014-12-30: 2 mg via INTRAVENOUS
  Filled 2014-12-30: qty 2

## 2014-12-30 MED ORDER — ROCURONIUM BROMIDE 100 MG/10ML IV SOLN
INTRAVENOUS | Status: DC | PRN
  Administered 2014-12-30: 40 mg via INTRAVENOUS
  Administered 2014-12-30: 10 mg via INTRAVENOUS

## 2014-12-30 MED ORDER — LIDOCAINE HCL (CARDIAC) 20 MG/ML IV SOLN
INTRAVENOUS | Status: DC | PRN
  Administered 2014-12-30: 100 mg via INTRAVENOUS

## 2014-12-30 MED ORDER — MEPERIDINE HCL 25 MG/ML IJ SOLN: 6.2500 mg | INTRAMUSCULAR | Status: DC | PRN

## 2014-12-30 MED ORDER — NEOSTIGMINE METHYLSULFATE 10 MG/10ML IV SOLN
INTRAVENOUS | Status: DC | PRN
  Administered 2014-12-30: 3 mg via INTRAVENOUS

## 2014-12-30 MED ORDER — GLYCOPYRROLATE 0.2 MG/ML IJ SOLN
INTRAMUSCULAR | Status: DC | PRN
  Administered 2014-12-30: 0.4 mg via INTRAVENOUS

## 2014-12-30 MED ORDER — MIDAZOLAM HCL 5 MG/5ML IJ SOLN
INTRAMUSCULAR | Status: DC | PRN
  Administered 2014-12-30: 2 mg via INTRAVENOUS

## 2014-12-30 MED ORDER — DEXAMETHASONE SODIUM PHOSPHATE 4 MG/ML IJ SOLN
INTRAMUSCULAR | Status: DC | PRN
  Administered 2014-12-30: 4 mg via INTRAVENOUS

## 2014-12-30 MED ORDER — PROPOFOL 10 MG/ML IV BOLUS
INTRAVENOUS | Status: DC | PRN
  Administered 2014-12-30: 200 mg via INTRAVENOUS

## 2014-12-30 MED ORDER — BUPIVACAINE HCL (PF) 0.25 % IJ SOLN
INTRAMUSCULAR | Status: DC | PRN
  Administered 2014-12-30: 16 mL

## 2014-12-30 MED ORDER — FENTANYL CITRATE (PF) 100 MCG/2ML IJ SOLN
INTRAMUSCULAR | Status: DC | PRN
  Administered 2014-12-30: 100 ug via INTRAVENOUS
  Administered 2014-12-30 (×3): 50 ug via INTRAVENOUS

## 2014-12-30 MED ORDER — HYDROMORPHONE HCL 2 MG PO TABS: 2.0000 mg | ORAL_TABLET | ORAL | Status: DC | PRN

## 2014-12-30 MED ORDER — LACTATED RINGERS IV BOLUS (SEPSIS)
1000.0000 mL | Freq: Once | INTRAVENOUS | Status: AC
  Administered 2014-12-30: 1000 mL via INTRAVENOUS

## 2014-12-30 MED ORDER — DIPHENHYDRAMINE HCL 50 MG/ML IJ SOLN
25.0000 mg | Freq: Once | INTRAMUSCULAR | Status: AC
  Administered 2014-12-30: 25 mg via INTRAVENOUS
  Filled 2014-12-30: qty 1

## 2014-12-30 MED ORDER — ONDANSETRON HCL 4 MG/2ML IJ SOLN
4.0000 mg | Freq: Once | INTRAMUSCULAR | Status: AC
  Administered 2014-12-30: 4 mg via INTRAVENOUS
  Filled 2014-12-30: qty 2

## 2014-12-30 MED ORDER — IBUPROFEN 200 MG PO TABS: 600.0000 mg | ORAL_TABLET | Freq: Four times a day (QID) | ORAL | Status: DC | PRN

## 2014-12-30 MED ORDER — METOCLOPRAMIDE HCL 5 MG/ML IJ SOLN: 10.0000 mg | Freq: Once | INTRAMUSCULAR | Status: DC | PRN

## 2014-12-30 MED ORDER — HYDROMORPHONE HCL 1 MG/ML IJ SOLN
1.0000 mg | Freq: Once | INTRAMUSCULAR | Status: AC
  Administered 2014-12-30: 1 mg via INTRAVENOUS
  Filled 2014-12-30: qty 1

## 2014-12-30 MED ORDER — FENTANYL CITRATE (PF) 100 MCG/2ML IJ SOLN: 25.0000 ug | INTRAMUSCULAR | Status: DC | PRN

## 2014-12-30 MED ORDER — LACTATED RINGERS IR SOLN
Status: DC | PRN
Start: 2014-12-30 — End: 2014-12-30
  Administered 2014-12-30: 3000 mL

## 2014-12-30 MED ORDER — LACTATED RINGERS IV SOLN
INTRAVENOUS | Status: DC | PRN
  Administered 2014-12-30 (×2): via INTRAVENOUS

## 2014-12-30 SURGICAL SUPPLY — 32 items
BAG SPEC RTRVL LRG 6X4 10 (ENDOMECHANICALS)
CABLE HIGH FREQUENCY MONO STRZ (ELECTRODE) IMPLANT
CATH ROBINSON RED A/P 16FR (CATHETERS) IMPLANT
CHLORAPREP W/TINT 26ML (MISCELLANEOUS) ×3 IMPLANT
CLOTH BEACON ORANGE TIMEOUT ST (SAFETY) ×3 IMPLANT
DECANTER SPIKE VIAL GLASS SM (MISCELLANEOUS) ×1 IMPLANT
DRSG COVADERM PLUS 2X2 (GAUZE/BANDAGES/DRESSINGS) ×4 IMPLANT
DRSG OPSITE POSTOP 3X4 (GAUZE/BANDAGES/DRESSINGS) ×2 IMPLANT
GLOVE BIO SURGEON STRL SZ 6.5 (GLOVE) ×2 IMPLANT
GLOVE BIO SURGEON STRL SZ8 (GLOVE) ×1 IMPLANT
GLOVE BIO SURGEONS STRL SZ 6.5 (GLOVE) ×1
GOWN STRL REUS W/TWL LRG LVL3 (GOWN DISPOSABLE) ×6 IMPLANT
LIQUID BAND (GAUZE/BANDAGES/DRESSINGS) ×3 IMPLANT
NEEDLE INSUFFLATION 120MM (ENDOMECHANICALS) ×3 IMPLANT
NS IRRIG 1000ML POUR BTL (IV SOLUTION) ×1 IMPLANT
PACK LAPAROSCOPY BASIN (CUSTOM PROCEDURE TRAY) ×3 IMPLANT
PAD POSITIONER PINK NONSTERILE (MISCELLANEOUS) ×1 IMPLANT
POUCH SPECIMEN RETRIEVAL 10MM (ENDOMECHANICALS) IMPLANT
SET IRRIG TUBING LAPAROSCOPIC (IRRIGATION / IRRIGATOR) ×2 IMPLANT
SHEARS HARMONIC ACE PLUS 36CM (ENDOMECHANICALS) IMPLANT
SLEEVE XCEL OPT CAN 5 100 (ENDOMECHANICALS) IMPLANT
SOLUTION ELECTROLUBE (MISCELLANEOUS) IMPLANT
SPONGE SURGIFOAM ABS GEL 12-7 (HEMOSTASIS) ×2 IMPLANT
SUT VIC AB 3-0 PS2 18 (SUTURE) ×6
SUT VIC AB 3-0 PS2 18XBRD (SUTURE) ×1 IMPLANT
SUT VICRYL 0 UR6 27IN ABS (SUTURE) ×4 IMPLANT
TOWEL OR 17X24 6PK STRL BLUE (TOWEL DISPOSABLE) ×6 IMPLANT
TROCAR HASSON GELL 12X100 (TROCAR) ×2 IMPLANT
TROCAR XCEL NON-BLD 11X100MML (ENDOMECHANICALS) IMPLANT
TROCAR XCEL NON-BLD 5MMX100MML (ENDOMECHANICALS) ×3 IMPLANT
WARMER LAPAROSCOPE (MISCELLANEOUS) ×3 IMPLANT
WATER STERILE IRR 1000ML POUR (IV SOLUTION) ×1 IMPLANT

## 2014-12-30 NOTE — Progress Notes (Signed)
Pt standing at bedside rocking back and forward. States Dilaudid took edge off pain but still a 10. Was 10 plus before dilaudid.

## 2014-12-30 NOTE — Transfer of Care (Signed)
Immediate Anesthesia Transfer of Care Note  Patient: [name]  Procedure(s) Performed: Procedure(s): LAPAROSCOPY OPERATIVE (N/A)  Patient Location: PACU  Anesthesia Type:General  Level of Consciousness: awake, alert  and oriented  Airway & Oxygen Therapy: Patient Spontanous Breathing and Patient connected to nasal cannula oxygen  Post-op Assessment: Report given to RN  Post vital signs: Reviewed  Last Vitals:  Filed Vitals:   12/30/14 1111  BP: 149/91  Pulse: 100  Temp: 37 C  Resp: 20    Complications: No apparent anesthesia complications

## 2014-12-30 NOTE — MAU Note (Signed)
Pt. To OR via stretcher. Report given to OR nurse.

## 2014-12-30 NOTE — Anesthesia Postprocedure Evaluation (Signed)
  Anesthesia Post-op Note  Patient: Museum/gallery conservator M McCauley  Procedure(s) Performed: Procedure(s): LAPAROSCOPY OPERATIVE (N/A)  Patient Location: PACU  Anesthesia Type:General  Level of Consciousness: awake, alert  and oriented  Airway and Oxygen Therapy: Patient Spontanous Breathing  Post-op Pain: mild  Post-op Assessment: Post-op Vital signs reviewed, Patient's Cardiovascular Status Stable, Respiratory Function Stable, Patent Airway, No signs of Nausea or vomiting and Pain level controlled              Post-op Vital Signs: Reviewed and stable  Last Vitals:  Filed Vitals:   12/30/14 2130  BP: 125/73  Pulse: 82  Temp:   Resp: 24    Complications: No apparent anesthesia complications

## 2014-12-30 NOTE — Anesthesia Procedure Notes (Signed)
Procedure Name: Intubation Date/Time: 12/30/2014 7:23 PM Performed by: Talbot Grumbling Pre-anesthesia Checklist: Patient identified, Emergency Drugs available, Suction available and Patient being monitored Patient Re-evaluated:Patient Re-evaluated prior to inductionOxygen Delivery Method: Circle system utilized Preoxygenation: Pre-oxygenation with 100% oxygen Intubation Type: IV induction and Cricoid Pressure applied Ventilation: Mask ventilation without difficulty Laryngoscope Size: Miller and 2 Grade View: Grade I Tube type: Oral Tube size: 7.0 mm Number of attempts: 1 Airway Equipment and Method: Stylet Placement Confirmation: ETT inserted through vocal cords under direct vision,  positive ETCO2 and breath sounds checked- equal and bilateral Secured at: 22 cm Tube secured with: Tape Dental Injury: Teeth and Oropharynx as per pre-operative assessment

## 2014-12-30 NOTE — MAU Provider Note (Signed)
History     CSN: [id number]  Arrival date and time: 12/30/14 1058   None     Chief Complaint  Patient presents with  . Abdominal Pain   HPI Hx of abdominal hysterectomy with BSO and R oophorectomy 11/12/2013 by Dr. Melba Coon Pt presents with severe lower abdominal pain that started this morning Pt has has some nausea and vomiting associated with the pain RN note:  MAU Note 12/30/2014 11:07 AM    Expand All Collapse All   Pain in lower abd, started this morning. Pain has gotten worse. Hx of this before- was from fibroids.. Had myomectomy followed by hysterectomy last year. Has one ovary. Some nausea, denies diarrhea or constipation. Denies UTI symptoms       Past Medical History  Diagnosis Date  . Allergy     seasonal allergies  . Pelvic cyst     Seen by gynecologist Dr. Harrington Challenger. Scheduled for ex lap on 12/02/11  . Fibroids   . Headache(784.0)     per patient tx for HA  . Bipolar disorder   . Asthma   . History of blood transfusion 11/2011    [place]   . Ovarian cyst   . Depression     ok currently    Past Surgical History  Procedure Laterality Date  . Myomectomy  12/02/2011    Procedure: MYOMECTOMY;  Surgeon: Gus Height, MD;  Location: [facility];  Service: Gynecology;  Laterality: N/A;  . Abdominal hysterectomy N/A 11/12/2013    Procedure: Abdominal Hysterectomy with bilateral salpingectomy, and Right oophorectomy;  Surgeon: Janyth Contes, MD;  Location: [facility];  Service: Gynecology;  Laterality: N/A;  . Scar revision N/A 11/12/2013    Procedure: SCAR REVISION;  Surgeon: Janyth Contes, MD;  Location: [facility];  Service: Gynecology;  Laterality: N/A;    Family History  Problem Relation Age of Onset  . Heart disease Mother   . Diabetes Paternal Aunt     History  Substance Use Topics  . Smoking status: Never Smoker   . Smokeless tobacco: Never Used  . Alcohol Use: No    Allergies:  Allergies  Allergen Reactions  . Dilaudid [Hydromorphone Hcl] Itching     Pt is taking this currently  . Clindamycin/Lincomycin Itching and Swelling  . Latex Other (See Comments)    Skin turns red  . Penicillins Other (See Comments)    Unknown - childhood reaction; Ok to have Ancef for pre-op antibiotic per Dr Melba Coon    Prescriptions prior to admission  Medication Sig Dispense Refill Last Dose  . divalproex (DEPAKOTE ER) 500 MG 24 hr tablet Take 1,000 mg by mouth daily. Per Dr. Tammi Klippel in [facility].  Take 1000 mg daily.  Always with a meal.  #60 with three refills.   12/29/2014 at Unknown time  . lamoTRIgine (LAMICTAL) 25 MG tablet Take 50 mg by mouth daily. Take 50 mg daily per Dr. Tammi Klippel in [facility].   12/29/2014 at Unknown time  . methocarbamol (ROBAXIN) 500 MG tablet Take 1 tablet (500 mg total) by mouth every 6 (six) hours as needed for muscle spasms. 45 tablet 0 Past Week at Unknown time  . traMADol (ULTRAM) 50 MG tablet Take 1 tablet (50 mg total) by mouth every 6 (six) hours as needed. (Patient taking differently: Take 50 mg by mouth every 6 (six) hours as needed for moderate pain. ) 15 tablet 0 Past Week at Unknown time  . albuterol (PROVENTIL HFA;VENTOLIN HFA) 108 (90 BASE) MCG/ACT inhaler Inhale 2 puffs  into the lungs every 4 (four) hours as needed for wheezing or shortness of breath. 3.7 g 1 12/28/2014  . lidocaine (LIDODERM) 5 % Place 1 patch onto the skin daily. Remove & Discard patch within 12 hours or as directed by MD (Patient not taking: Reported on 10/31/2014) 30 patch 1 Completed Course at Unknown time    Review of Systems  Constitutional: Negative for fever and chills.  Gastrointestinal: Positive for nausea, vomiting and abdominal pain. Negative for diarrhea and constipation.  Genitourinary: Negative for dysuria and urgency.  Musculoskeletal: Negative for back pain.  Neurological: Negative for headaches.   Physical Exam   Blood pressure 149/91, pulse 100, temperature 98.6 F (37 C), temperature source Oral, resp. rate 20, weight 222 lb (100.699 kg),  last menstrual period 09/29/2013.  Physical Exam  Vitals reviewed. Constitutional: She appears well-developed and well-nourished. She appears distressed.  Pt curled up on bedside -vomiting and rocking After zofran IV given, pt's nausea better but pain persisted Pt standing and pacing back and forth  HENT:  Head: Normocephalic.  Eyes: Pupils are equal, round, and reactive to light.  Neck: Normal range of motion.  Cardiovascular: Normal rate.   Respiratory: Effort normal.  GI: There is tenderness.  Musculoskeletal: Normal range of motion.  Neurological: She is alert.  Skin: Skin is warm and dry.  Psychiatric: She has a normal mood and affect.    MAU Course  Procedures IV LR  Toradol 30mg  IV  Diluadid 1 mg took a little edge off- still miserable with pain- level down from 10+ to 10/10 Benadryl 25mg  IV Dilaudid 2mg  IV took a little edge off of pain Results for orders placed or performed during the hospital encounter of 12/30/14 (from the past 24 hour(s))  Urinalysis, Routine w reflex microscopic (not at [facility])     Status: None   Collection Time: 12/30/14 12:30 PM  Result Value Ref Range   Color, Urine YELLOW YELLOW   APPearance CLEAR CLEAR   Specific Gravity, Urine 1.015 1.005 - 1.030   pH 7.5 5.0 - 8.0   Glucose, UA NEGATIVE NEGATIVE mg/dL   Hgb urine dipstick NEGATIVE NEGATIVE   Bilirubin Urine NEGATIVE NEGATIVE   Ketones, ur NEGATIVE NEGATIVE mg/dL   Protein, ur NEGATIVE NEGATIVE mg/dL   Urobilinogen, UA 0.2 0.0 - 1.0 mg/dL   Nitrite NEGATIVE NEGATIVE   Leukocytes, UA NEGATIVE NEGATIVE  CBC     Status: None   Collection Time: 12/30/14 12:40 PM  Result Value Ref Range   WBC 7.1 4.0 - 10.5 K/uL   RBC 3.93 3.87 - 5.11 MIL/uL   Hemoglobin 12.8 12.0 - 15.0 g/dL   HCT 36.3 36.0 - 46.0 %   MCV 92.4 78.0 - 100.0 fL   MCH 32.6 26.0 - 34.0 pg   MCHC 35.3 30.0 - 36.0 g/dL   RDW 12.5 11.5 - 15.5 %   Platelets 286 150 - 400 K/uL  Comprehensive metabolic panel     Status:  Abnormal   Collection Time: 12/30/14 12:40 PM  Result Value Ref Range   Sodium 132 (L) 135 - 145 mmol/L   Potassium 4.4 3.5 - 5.1 mmol/L   Chloride 105 101 - 111 mmol/L   CO2 21 (L) 22 - 32 mmol/L   Glucose, Bld 93 65 - 99 mg/dL   BUN 10 6 - 20 mg/dL   Creatinine, Ser 0.64 0.44 - 1.00 mg/dL   Calcium 9.5 8.9 - 10.3 mg/dL   Total Protein 7.5 6.5 - 8.1 g/dL  Albumin 4.0 3.5 - 5.0 g/dL   AST 21 15 - 41 U/L   ALT 12 (L) 14 - 54 U/L   Alkaline Phosphatase 61 38 - 126 U/L   Total Bilirubin 0.8 0.3 - 1.2 mg/dL   GFR calc non Af Amer >60 >60 mL/min   GFR calc Af Amer >60 >60 mL/min   Anion gap 6 5 - 15  US Transvaginal Non-ob  12/30/2014   CLINICAL DATA:  Lower abdominal pain. Prior hysterectomy and bilateral salpingectomy. Right oophorectomy.  EXAM: TRANSABDOMINAL AND TRANSVAGINAL ULTRASOUND OF PELVIS  TECHNIQUE: Both transabdominal and transvaginal ultrasound examinations of the pelvis were performed. Transabdominal technique was performed for global imaging of the pelvis including uterus, ovaries, adnexal regions, and pelvic cul-de-sac. It was necessary to proceed with endovaginal exam following the transabdominal exam to visualize the left ovary and adnexa.  COMPARISON:  CT 11/24/2013  FINDINGS: Uterus  Measurements: Surgically absent.  Endometrium  Thickness: N/A.  Right ovary  Measurements: Surgically absent. No adnexal mass.  Left ovary  Measurements: 5.3 x 4.0 x 4.2 cm. This appears enlarged and heterogeneous. No focal lesion visualized.  Other findings  Trace free fluid.  IMPRESSION: Prior hysterectomy and right oophorectomy. Left U scratch head left ovary appears enlarged and heterogeneous without focal abnormality. If there is concern for torsion, may consider Doppler evaluation.   Electronically Signed   By: Rolm Baptise M.D.   On: 12/30/2014 14:39   US Pelvis Complete  12/30/2014   CLINICAL DATA:  Lower abdominal pain. Prior hysterectomy and bilateral salpingectomy. Right oophorectomy.   EXAM: TRANSABDOMINAL AND TRANSVAGINAL ULTRASOUND OF PELVIS  TECHNIQUE: Both transabdominal and transvaginal ultrasound examinations of the pelvis were performed. Transabdominal technique was performed for global imaging of the pelvis including uterus, ovaries, adnexal regions, and pelvic cul-de-sac. It was necessary to proceed with endovaginal exam following the transabdominal exam to visualize the left ovary and adnexa.  COMPARISON:  CT 11/24/2013  FINDINGS: Uterus  Measurements: Surgically absent.  Endometrium  Thickness: N/A.  Right ovary  Measurements: Surgically absent. No adnexal mass.  Left ovary  Measurements: 5.3 x 4.0 x 4.2 cm. This appears enlarged and heterogeneous. No focal lesion visualized.  Other findings  Trace free fluid.  IMPRESSION: Prior hysterectomy and right oophorectomy. Left U scratch head left ovary appears enlarged and heterogeneous without focal abnormality. If there is concern for torsion, may consider Doppler evaluation.   Electronically Signed   By: Rolm Baptise M.D.   On: 12/30/2014 14:39   US Pelvis Limited  12/30/2014   CLINICAL DATA:  29 year old female with pelvic pain. Patient has history of prior hysterectomy and right oophorectomy.  EXAM: DOPPLER ULTRASOUND OF OVARIES  TECHNIQUE: Color and duplex Doppler ultrasound was utilized to evaluate blood flow to the left ovary.  COMPARISON:  Pelvic ultrasound dated 12/30/2014  FINDINGS: Color and spectral Doppler images of the left ovary was performed.  The left ovary is enlarged measuring 6.1 x 4.0 x 4.5 cm. There is a 2.5 x 5.1 x 3.8 cm complex lesion with heterogeneous echogenic content and no significant internal Doppler flow within the left ovary possibly representing a hemorrhagic cyst. Arterial and venous waveform are noted in the periphery of this lesion within the ovarian tissue.  IMPRESSION: No evidence of torsion.  Left ovarian complex lesion, likely a hemorrhagic cyst. Follow-up with ultrasound after 2 menstrual cycles  recommended.   Electronically Signed   By: Anner Crete M.D.   On: 12/30/2014 16:14   [place] Art/ven  Flow Abd Pelv Doppler  12/30/2014   CLINICAL DATA:  29 year old female with pelvic pain. Patient has history of prior hysterectomy and right oophorectomy.  EXAM: DOPPLER ULTRASOUND OF OVARIES  TECHNIQUE: Color and duplex Doppler ultrasound was utilized to evaluate blood flow to the left ovary.  COMPARISON:  Pelvic ultrasound dated 12/30/2014  FINDINGS: Color and spectral Doppler images of the left ovary was performed.  The left ovary is enlarged measuring 6.1 x 4.0 x 4.5 cm. There is a 2.5 x 5.1 x 3.8 cm complex lesion with heterogeneous echogenic content and no significant internal Doppler flow within the left ovary possibly representing a hemorrhagic cyst. Arterial and venous waveform are noted in the periphery of this lesion within the ovarian tissue.  IMPRESSION: No evidence of torsion.  Left ovarian complex lesion, likely a hemorrhagic cyst. Follow-up with ultrasound after 2 menstrual cycles recommended.   Electronically Signed   By: Anner Crete M.D.   On: 12/30/2014 16:14  discussed with Dr. Marvel Plan- pt continues to be in pain Will repeat Dilaudid 2mg  IV and keep pt NPO Dr. Marvel Plan will come see pt  Assessment and Plan    Aesha Agrawal 12/30/2014, 11:52 AM

## 2014-12-30 NOTE — Discharge Instructions (Signed)

## 2014-12-30 NOTE — Progress Notes (Signed)
After returning from u/s, pt states the 2nd dose of Dilaudid just took the edge off pain. Still at bedside rocking

## 2014-12-30 NOTE — MAU Note (Signed)
Pt aware waiting for u/s report and provider aware of pain level. U/s report will help determine further  POC. Pt agrees.

## 2014-12-30 NOTE — MAU Note (Addendum)
Pain in lower abd, started this morning.  Pain has gotten worse.  Hx of this before- was from fibroids.. Had myomectomy followed by hysterectomy last year. Has one ovary.  Some nausea, denies diarrhea or constipation.  Denies UTI symptoms

## 2014-12-30 NOTE — Anesthesia Preprocedure Evaluation (Addendum)
Anesthesia Evaluation  Patient identified by MRN, date of birth, ID band Patient awake    Reviewed: Allergy & Precautions, H&P , NPO status , Patient's Chart, lab work & pertinent test results, reviewed documented beta blocker date and time   Airway Mallampati: III  TM Distance: >3 FB Neck ROM: full    Dental no notable dental hx. (+) Teeth Intact   Pulmonary asthma ,  breath sounds clear to auscultation  Pulmonary exam normal       Cardiovascular negative cardio ROS Normal cardiovascular examRhythm:regular Rate:Normal     Neuro/Psych  Headaches, PSYCHIATRIC DISORDERS Depression Bipolar Disorder  Neuromuscular disease    GI/Hepatic negative GI ROS, Neg liver ROS,   Endo/Other  negative endocrine ROS  Renal/GU negative Renal ROS  negative genitourinary   Musculoskeletal negative musculoskeletal ROS (+)   Abdominal Normal abdominal exam  (+) + obese,   Peds  Hematology negative hematology ROS (+)   Anesthesia Other Findings   Reproductive/Obstetrics Ovarian torsion Pelvic pain                            Anesthesia Physical  Anesthesia Plan  ASA: II and emergent  Anesthesia Plan: General   Post-op Pain Management:    Induction: Intravenous  Airway Management Planned: Oral ETT  Additional Equipment:   Intra-op Plan:   Post-operative Plan: Extubation in OR  Informed Consent: I have reviewed the patients History and Physical, chart, labs and discussed the procedure including the risks, benefits and alternatives for the proposed anesthesia with the patient or authorized representative who has indicated his/her understanding and acceptance.   Dental Advisory Given and Dental advisory given  Plan Discussed with: CRNA, Surgeon and Anesthesiologist  Anesthesia Plan Comments: (  Discussed general anesthesia, including possible nausea, instrumentation of airway, sore throat,pulmonary  aspiration, etc. I asked if the were any outstanding questions, or  concerns before we proceeded. )       Anesthesia Quick Evaluation

## 2014-12-30 NOTE — Op Note (Signed)
Operative report  Preoperative diagnosis Acute abdominal pain unresponsive to pain medications Complex ovarian cyst on pelvic ultrasound History of TAH RSO  Postoperative diagnosis Same with hemorrhagic ovarian cyst and no evidence of torsion  Procedure Open laparoscopy with evacuation of hemorrhagic ovarian cyst  Surgeon Dr. Paula Compton  Anesthesia Gen.  Fluids Estimated blood loss 100 cc IV fluid 1200 cc LR Urine output 150 cc clear  Findings The uterus and right adnexa were absent as per her prior surgery. The left adnexa was partially adherent to the pelvic sidewall and cul-de-sac. It measured approximately 4-5 cm and there was an opening in its posterior surface with a moderate amount of clot collected in that space.  The scar tissue was minimal.  Specimen None  Procedure note Patient was taken to the operating room where general anesthesia was obtained without difficulty. She was prepped and draped in the normal sterile fashion in the dorsal supine position. An appropriate time out was performed. With a Foley catheter in place attention was turned to the abdomen where a infraumbilical incision was made with the scalpel after injection with quarter percent Marcaine. The fascia was noted to be somewhat deep but was identified and grasped with Coker clamps and elevated. This was then opened sharply with Mayo scissors. The peritoneal cavity was then entered bluntly. The S retractors were then placed within to confirm intraperitoneal placement. The [facility] trocar was put in position and inflated. Pneumoperitoneum was then obtained with approximately 3 L of CO2 gas. The camera was placed within the abdomen and the findings as previously stated. There is no significant hemoperitoneum of there was a pocket of clot collected in and under the left adnexa. The adnexal pedicle was carefully inspected and no torsion noted. The suction irrigator was utilized to remove the clot that was  under an adherent inside the ovarian capsule. There was no active bleeding noted just a small amount of oozing in that space. Because that area was somewhat adherent to the sidewall and close to the ureter no cautery was attempted. The area was packed with a Gelfoam and observed with no active bleeding noted. The Sheryle Hail was then deflated and removed from the abdomen. With the 5 mm scope visualizing the area the fascia at the [facility] was closed with several interrupted sutures of 0 Vicryl. Care was taken and there was no entrapment of underlying tissue or bowel in the suture. Once this was closed the pelvis was once again inspected and no active bleeding noted. The pneumoperitoneum was then reduced through the 5 mm port which was also removed. The skin was closed with 3-0 Vicryl in subcuticular stitch and Dermabond. The patient was then awakened and taken to the recovery room in good condition.

## 2014-12-30 NOTE — H&P (Signed)
[name] is an 29 y.o. female who presents to [facility]U with onset of unbearable left abdominal pain with some nausea and emesis x 1.  She has been worked up with [place] and [place].  Her history is significant for TAH/BS/RO 5/15 for fibroids and pelvic pain.  Post-operative course was significant for a pelvic abscess/hematoma which responded to conservative therapy with IV abx.  Since that time she has had intermittent pain that she has "dealt with," but today her pain became increasingly severe. She has received several doses of IV dilaudid and is still pacing around the room with no relief.  Abdomen is not obviously acute, but pain is not responding to meds.  US shows a complex cyst but no obvious torsion.  Pertinent Gynecological History: Myomectomy 2013 TAH/BS/RO  2015  Menstrual History:  Patient's last menstrual period was 09/29/2013.    Past Medical History  Diagnosis Date  . Allergy     seasonal allergies  . Pelvic cyst     Seen by gynecologist Dr. Harrington Challenger. Scheduled for ex lap on 12/02/11  . Fibroids   . Headache(784.0)     per patient tx for HA  . Bipolar disorder   . Asthma   . History of blood transfusion 11/2011    [place]   . Ovarian cyst   . Depression     ok currently    Past Surgical History  Procedure Laterality Date  . Myomectomy  12/02/2011    Procedure: MYOMECTOMY;  Surgeon: Gus Height, MD;  Location: [facility];  Service: Gynecology;  Laterality: N/A;  . Abdominal hysterectomy N/A 11/12/2013    Procedure: Abdominal Hysterectomy with bilateral salpingectomy, and Right oophorectomy;  Surgeon: Janyth Contes, MD;  Location: [facility];  Service: Gynecology;  Laterality: N/A;  . Scar revision N/A 11/12/2013    Procedure: SCAR REVISION;  Surgeon: Janyth Contes, MD;  Location: [facility];  Service: Gynecology;  Laterality: N/A;    Family History  Problem Relation Age of Onset  . Heart disease Mother   . Diabetes Paternal Aunt     Social History:  reports that she has never  smoked. She has never used smokeless tobacco. She reports that she does not drink alcohol or use illicit drugs.  Allergies:  Allergies  Allergen Reactions  . Dilaudid [Hydromorphone Hcl] Itching    Pt can take this med if she also takes benadryl  . Clindamycin/Lincomycin Itching and Swelling  . Latex Other (See Comments)    Skin turns red  . Penicillins Other (See Comments)    Unknown - childhood reaction; Ok to have Ancef for pre-op antibiotic per Dr Melba Coon    Prescriptions prior to admission  Medication Sig Dispense Refill Last Dose  . divalproex (DEPAKOTE ER) 500 MG 24 hr tablet Take 1,000 mg by mouth daily. Per Dr. Tammi Klippel in [facility].  Take 1000 mg daily.  Always with a meal.  #60 with three refills.   12/29/2014 at Unknown time  . lamoTRIgine (LAMICTAL) 25 MG tablet Take 50 mg by mouth daily. Take 50 mg daily per Dr. Tammi Klippel in [facility].   12/29/2014 at Unknown time  . methocarbamol (ROBAXIN) 500 MG tablet Take 1 tablet (500 mg total) by mouth every 6 (six) hours as needed for muscle spasms. 45 tablet 0 Past Week at Unknown time  . traMADol (ULTRAM) 50 MG tablet Take 1 tablet (50 mg total) by mouth every 6 (six) hours as needed. (Patient taking differently: Take 50 mg by mouth every 6 (six) hours  as needed for moderate pain. ) 15 tablet 0 Past Week at Unknown time  . albuterol (PROVENTIL HFA;VENTOLIN HFA) 108 (90 BASE) MCG/ACT inhaler Inhale 2 puffs into the lungs every 4 (four) hours as needed for wheezing or shortness of breath. 3.7 g 1 12/28/2014  . lidocaine (LIDODERM) 5 % Place 1 patch onto the skin daily. Remove & Discard patch within 12 hours or as directed by MD (Patient not taking: Reported on 10/31/2014) 30 patch 1 Completed Course at Unknown time    ROS  Blood pressure 149/91, pulse 100, temperature 98.6 F (37 C), temperature source Oral, resp. rate 20, weight 100.699 kg (222 lb), last menstrual period 09/29/2013. Physical Exam  CV RRR Lungs CTA bilaterally Abd ND, left guarding  and tenderness         No rebound Pelvic deferred   Results for orders placed or performed during the hospital encounter of 12/30/14 (from the past 24 hour(s))  Urinalysis, Routine w reflex microscopic (not at [facility])     Status: None   Collection Time: 12/30/14 12:30 PM  Result Value Ref Range   Color, Urine YELLOW YELLOW   APPearance CLEAR CLEAR   Specific Gravity, Urine 1.015 1.005 - 1.030   pH 7.5 5.0 - 8.0   Glucose, UA NEGATIVE NEGATIVE mg/dL   Hgb urine dipstick NEGATIVE NEGATIVE   Bilirubin Urine NEGATIVE NEGATIVE   Ketones, ur NEGATIVE NEGATIVE mg/dL   Protein, ur NEGATIVE NEGATIVE mg/dL   Urobilinogen, UA 0.2 0.0 - 1.0 mg/dL   Nitrite NEGATIVE NEGATIVE   Leukocytes, UA NEGATIVE NEGATIVE  CBC     Status: None   Collection Time: 12/30/14 12:40 PM  Result Value Ref Range   WBC 7.1 4.0 - 10.5 K/uL   RBC 3.93 3.87 - 5.11 MIL/uL   Hemoglobin 12.8 12.0 - 15.0 g/dL   HCT 36.3 36.0 - 46.0 %   MCV 92.4 78.0 - 100.0 fL   MCH 32.6 26.0 - 34.0 pg   MCHC 35.3 30.0 - 36.0 g/dL   RDW 12.5 11.5 - 15.5 %   Platelets 286 150 - 400 K/uL  Comprehensive metabolic panel     Status: Abnormal   Collection Time: 12/30/14 12:40 PM  Result Value Ref Range   Sodium 132 (L) 135 - 145 mmol/L   Potassium 4.4 3.5 - 5.1 mmol/L   Chloride 105 101 - 111 mmol/L   CO2 21 (L) 22 - 32 mmol/L   Glucose, Bld 93 65 - 99 mg/dL   BUN 10 6 - 20 mg/dL   Creatinine, Ser 0.64 0.44 - 1.00 mg/dL   Calcium 9.5 8.9 - 10.3 mg/dL   Total Protein 7.5 6.5 - 8.1 g/dL   Albumin 4.0 3.5 - 5.0 g/dL   AST 21 15 - 41 U/L   ALT 12 (L) 14 - 54 U/L   Alkaline Phosphatase 61 38 - 126 U/L   Total Bilirubin 0.8 0.3 - 1.2 mg/dL   GFR calc non Af Amer >60 >60 mL/min   GFR calc Af Amer >60 >60 mL/min   Anion gap 6 5 - 15    US Transvaginal Non-ob  12/30/2014   CLINICAL DATA:  Lower abdominal pain. Prior hysterectomy and bilateral salpingectomy. Right oophorectomy.  EXAM: TRANSABDOMINAL AND TRANSVAGINAL ULTRASOUND OF PELVIS   TECHNIQUE: Both transabdominal and transvaginal ultrasound examinations of the pelvis were performed. Transabdominal technique was performed for global imaging of the pelvis including uterus, ovaries, adnexal regions, and pelvic cul-de-sac. It was necessary to proceed with  endovaginal exam following the transabdominal exam to visualize the left ovary and adnexa.  COMPARISON:  CT 11/24/2013  FINDINGS: Uterus  Measurements: Surgically absent.  Endometrium  Thickness: N/A.  Right ovary  Measurements: Surgically absent. No adnexal mass.  Left ovary  Measurements: 5.3 x 4.0 x 4.2 cm. This appears enlarged and heterogeneous. No focal lesion visualized.  Other findings  Trace free fluid.  IMPRESSION: Prior hysterectomy and right oophorectomy. Left U scratch head left ovary appears enlarged and heterogeneous without focal abnormality. If there is concern for torsion, may consider Doppler evaluation.   Electronically Signed   By: Rolm Baptise M.D.   On: 12/30/2014 14:39   US Pelvis Complete  12/30/2014   CLINICAL DATA:  Lower abdominal pain. Prior hysterectomy and bilateral salpingectomy. Right oophorectomy.  EXAM: TRANSABDOMINAL AND TRANSVAGINAL ULTRASOUND OF PELVIS  TECHNIQUE: Both transabdominal and transvaginal ultrasound examinations of the pelvis were performed. Transabdominal technique was performed for global imaging of the pelvis including uterus, ovaries, adnexal regions, and pelvic cul-de-sac. It was necessary to proceed with endovaginal exam following the transabdominal exam to visualize the left ovary and adnexa.  COMPARISON:  CT 11/24/2013  FINDINGS: Uterus  Measurements: Surgically absent.  Endometrium  Thickness: N/A.  Right ovary  Measurements: Surgically absent. No adnexal mass.  Left ovary  Measurements: 5.3 x 4.0 x 4.2 cm. This appears enlarged and heterogeneous. No focal lesion visualized.  Other findings  Trace free fluid.  IMPRESSION: Prior hysterectomy and right oophorectomy. Left U scratch head  left ovary appears enlarged and heterogeneous without focal abnormality. If there is concern for torsion, may consider Doppler evaluation.   Electronically Signed   By: Rolm Baptise M.D.   On: 12/30/2014 14:39   US Pelvis Limited  12/30/2014   CLINICAL DATA:  29 year old female with pelvic pain. Patient has history of prior hysterectomy and right oophorectomy.  EXAM: DOPPLER ULTRASOUND OF OVARIES  TECHNIQUE: Color and duplex Doppler ultrasound was utilized to evaluate blood flow to the left ovary.  COMPARISON:  Pelvic ultrasound dated 12/30/2014  FINDINGS: Color and spectral Doppler images of the left ovary was performed.  The left ovary is enlarged measuring 6.1 x 4.0 x 4.5 cm. There is a 2.5 x 5.1 x 3.8 cm complex lesion with heterogeneous echogenic content and no significant internal Doppler flow within the left ovary possibly representing a hemorrhagic cyst. Arterial and venous waveform are noted in the periphery of this lesion within the ovarian tissue.  IMPRESSION: No evidence of torsion.  Left ovarian complex lesion, likely a hemorrhagic cyst. Follow-up with ultrasound after 2 menstrual cycles recommended.   Electronically Signed   By: Anner Crete M.D.   On: 12/30/2014 16:14   [place] Art/ven Flow Abd Pelv Doppler  12/30/2014   CLINICAL DATA:  29 year old female with pelvic pain. Patient has history of prior hysterectomy and right oophorectomy.  EXAM: DOPPLER ULTRASOUND OF OVARIES  TECHNIQUE: Color and duplex Doppler ultrasound was utilized to evaluate blood flow to the left ovary.  COMPARISON:  Pelvic ultrasound dated 12/30/2014  FINDINGS: Color and spectral Doppler images of the left ovary was performed.  The left ovary is enlarged measuring 6.1 x 4.0 x 4.5 cm. There is a 2.5 x 5.1 x 3.8 cm complex lesion with heterogeneous echogenic content and no significant internal Doppler flow within the left ovary possibly representing a hemorrhagic cyst. Arterial and venous waveform are noted in the periphery  of this lesion within the ovarian tissue.  IMPRESSION: No evidence of torsion.  Left ovarian complex lesion, likely a hemorrhagic cyst. Follow-up with ultrasound after 2 menstrual cycles recommended.   Electronically Signed   By: Anner Crete M.D.   On: 12/30/2014 16:14    Assessment/Plan: D/w pt her pain control issues and that after receiving 4mg  of dilaudid IV I would expect her to have some relief.  She denies recent narcotic use.  Although the [place] did not suggest torsion, I cannot account for her pain with pathology of an ovarian cyst.  We discussed it is possible there could be torsion that the [place] did not pick up.  We discussed options of continued pain management vs a diagnostic scope to r/o torsion.  The pt does not feel she can cope with the pain as it is not getting any relief.  I carefully reviewed with her the possible negatives of surgery including that she may have significant scar tissue that would increase her chances of bowel and bladder injury.  We also discussed that manipulating the ovary could result in bleeding and the loss of her one remaining ovary, leading her into menopause.  She states that she wants surgery to definitively r/o torsion since her pain is so great.  The OR is notified and we will proceed when ready.  NPO since 200am.   Zakhia Seres W 12/30/2014, 6:49 PM

## 2014-12-31 ENCOUNTER — Encounter (HOSPITAL_COMMUNITY): Payer: Self-pay | Admitting: Obstetrics and Gynecology

## 2015-01-07 ENCOUNTER — Ambulatory Visit: Payer: 59 | Admitting: Psychology

## 2015-01-07 ENCOUNTER — Telehealth: Payer: Self-pay | Admitting: Psychology

## 2015-01-07 NOTE — Telephone Encounter (Signed)
[name] had an appointment for [place] this morning that she did not show for.  Reviewed chart and noted recent surgery.  Called and left a VM to check-in.

## 2015-04-13 ENCOUNTER — Ambulatory Visit: Payer: 59 | Attending: Family Medicine

## 2015-04-13 DIAGNOSIS — M6281 Muscle weakness (generalized): Secondary | ICD-10-CM | POA: Insufficient documentation

## 2015-04-13 DIAGNOSIS — M2560 Stiffness of unspecified joint, not elsewhere classified: Secondary | ICD-10-CM | POA: Insufficient documentation

## 2015-04-13 DIAGNOSIS — R293 Abnormal posture: Secondary | ICD-10-CM | POA: Diagnosis present

## 2015-04-13 DIAGNOSIS — M545 Low back pain, unspecified: Secondary | ICD-10-CM

## 2015-04-13 DIAGNOSIS — M546 Pain in thoracic spine: Secondary | ICD-10-CM | POA: Insufficient documentation

## 2015-04-13 DIAGNOSIS — M62838 Other muscle spasm: Secondary | ICD-10-CM | POA: Diagnosis not present

## 2015-04-13 NOTE — Patient Instructions (Signed)
She was asked to bring in her exercise sheets for review
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
## 2015-05-05 ENCOUNTER — Ambulatory Visit: Payer: 59 | Attending: Family Medicine

## 2015-05-05 DIAGNOSIS — M6281 Muscle weakness (generalized): Secondary | ICD-10-CM | POA: Insufficient documentation

## 2015-05-05 DIAGNOSIS — M2560 Stiffness of unspecified joint, not elsewhere classified: Secondary | ICD-10-CM | POA: Insufficient documentation

## 2015-05-05 DIAGNOSIS — M545 Low back pain, unspecified: Secondary | ICD-10-CM

## 2015-05-05 DIAGNOSIS — M62838 Other muscle spasm: Secondary | ICD-10-CM | POA: Insufficient documentation

## 2015-05-05 DIAGNOSIS — R293 Abnormal posture: Secondary | ICD-10-CM | POA: Insufficient documentation

## 2015-05-05 DIAGNOSIS — M546 Pain in thoracic spine: Secondary | ICD-10-CM | POA: Diagnosis present

## 2015-05-05 NOTE — Therapy (Signed)
[name], [place], [address] Phone: [phone]   Fax:  [phone]  Physical Therapy Treatment  Patient Details  Name: [name] MRN: [id number] Date of Birth: 08-02-1985 No Data Recorded  Encounter Date: 05/05/2015      Trevino End of Session - 05/05/15 1712    Visit Number 2   Number of Visits 12   Date for Trevino Re-Evaluation 05/25/15   Trevino Start Time 0433   Trevino Stop Time 0523   Trevino Time Calculation (min) 50 min   Activity Tolerance Patient limited by pain   Behavior During Therapy [facility] for tasks assessed/performed      Past Medical History  Diagnosis Date  . Allergy     seasonal allergies  . Pelvic cyst     Seen by gynecologist Dr. Harrington Challenger. Scheduled for ex lap on 12/02/11  . Fibroids   . Headache(784.0)     per patient tx for HA  . Bipolar disorder   . Asthma   . History of blood transfusion 11/2011    [place]   . Ovarian cyst   . Depression     ok currently    Past Surgical History  Procedure Laterality Date  . Myomectomy  12/02/2011    Procedure: MYOMECTOMY;  Surgeon: Gus Height, MD;  Location: [facility];  Service: Gynecology;  Laterality: N/A;  . Abdominal hysterectomy N/A 11/12/2013    Procedure: Abdominal Hysterectomy with bilateral salpingectomy, and Right oophorectomy;  Surgeon: Janyth Contes, MD;  Location: [facility];  Service: Gynecology;  Laterality: N/A;  . Scar revision N/A 11/12/2013    Procedure: SCAR REVISION;  Surgeon: Janyth Contes, MD;  Location: [facility];  Service: Gynecology;  Laterality: N/A;  . Laparoscopy N/A 12/30/2014    Procedure: LAPAROSCOPY OPERATIVE;  Surgeon: Paula Compton, MD;  Location: [facility];  Service: Gynecology;  Laterality: N/A;    There were no vitals filed for this visit.  Visit Diagnosis:  Muscle spasms of both lower extremities  Bilateral low back pain without sciatica  Bilateral thoracic back pain  Joint stiffness of spine  Abnormal  posture      Subjective Assessment - 05/05/15 1642    Subjective Hurting today. Nothing specific. Tried to stretch but no benefit.    Currently in Pain? Yes    8/10 in lower to middle back.                      [facility] Adult Trevino Treatment/Exercise - 05/05/15 0001    Moist Heat Therapy   Number Minutes Moist Heat 20 Minutes   Moist Heat Location Lumbar Spine   Electrical Stimulation   Electrical Stimulation Location lower thoracic and lum bar spine   Electrical Stimulation Action IFC   Electrical Stimulation Parameters L12   Electrical Stimulation Goals Pain   Manual Therapy   Manual Therapy Soft tissue mobilization;Joint mobilization   Soft tissue mobilization To lwer and middle back.    Kinesiotex Inhibit Muscle   Kinesiotix   Inhibit Muscle  2 long Y's along spine RT and LT     Fascial release  Nustep 5 min L 3  U E and LE           Trevino Short Term Goals - 04/13/15 1619    Trevino SHORT TERM GOAL #1   Title She will be independnet with initial HEP   Time 3   Period Weeks   Status New   Trevino SHORT  TERM GOAL #2   Title She will report pain decreased 25% or more    Time 3   Period Weeks   Status New           Trevino Long Term Goals - 04/13/15 1620    Trevino LONG TERM GOAL #1   Title She will be independnet with all hEP issued as of last visit   Time 6   Period Weeks   Status New   Trevino LONG TERM GOAL #2   Title She will report pain decreased 50% or more in back with home tasks   Time 6   Period Weeks   Status New   Trevino LONG TERM GOAL #3   Title She will report pain decreased 50% with work tasks.    Time 6   Period Weeks   Status New   Trevino LONG TERM GOAL #4   Time 6   Period Weeks   Status New               Plan - 05/05/15 1714    Clinical Impression Statement She continues to report high pain levels without specific cause. She is tneder over lower back but became less so after STW and fascial rlease with sustained pressure. We will continue  to work on pain and progress exercise as tolerated She did not take ibuprophen for past 10 hours or so and I suggested she may need to take sme what more frequent than she has to manage pain.    Trevino Next Visit Plan Continue modalities and manual to see if pain can lessen to perform exercise .    Consulted and Agree with Plan of Care Patient        Problem List Patient Active Problem List   Diagnosis Date Noted  . Wheeze 11/21/2014  . Back pain 11/03/2014  . Pelvic abscess in female 11/24/2013  . S/P TAH (total abdominal hysterectomy) 11/12/2013  . Fibroids, intramural 11/11/2013  . Leiomyoma of uterus 10/06/2013  . Bipolar 2 disorder ([place]) 09/18/2013  . Morbid obesity ([place]) 08/30/2013  . Low back pain 02/09/2013  . Tooth pain 01/12/2013  . Strain of iliopsoas muscle 12/04/2012  . Galactorrhea 07/12/2012  . Migraine headache 07/12/2012  . Paresthesia 07/12/2012  . RHINITIS, ALLERGIC 08/31/2006  . MENSTRUATION, PAINFUL 08/31/2006    [name] 05/05/2015, 5:18 PM  [facility] [address] [place], [place], [address] Phone: [phone]   Fax:  [phone]  Name: [name] MRN: [id number] Date of Birth: [birth year]

## 2015-05-11 ENCOUNTER — Ambulatory Visit: Payer: 59

## 2015-05-11 DIAGNOSIS — M62838 Other muscle spasm: Secondary | ICD-10-CM | POA: Diagnosis not present

## 2015-05-11 DIAGNOSIS — M6281 Muscle weakness (generalized): Secondary | ICD-10-CM

## 2015-05-11 DIAGNOSIS — M545 Low back pain, unspecified: Secondary | ICD-10-CM

## 2015-05-11 DIAGNOSIS — M2560 Stiffness of unspecified joint, not elsewhere classified: Secondary | ICD-10-CM

## 2015-05-11 NOTE — Patient Instructions (Signed)
Knee to Chest    Lying supine, bend involved knee to chest 3___ times. Repeat with other leg.   30 sec holdPelvic Tilt: Posterior - Legs Bent (Supine)    Tighten stomach and flatten back by rolling pelvis down. Hold __5-10__ seconds. Relax. Repeat __10__ times per set. Do _1-2___ sets per session. Do __2__ sessions per day.  http://orth.exer.us/203   Copyright  VHI. All rights reserved.   Do __2_ times per day.  Copyright  VHI. All rights reserved.

## 2015-05-11 NOTE — Therapy (Addendum)
[place], [place], [address] Phone: [phone]   Fax:  [phone]  Physical Therapy Treatment  Patient Details  Name: [name] MRN: [id number] Date of Birth: 09-27-1985 No Data Recorded  Encounter Date: 05/11/2015      PT End of Session - 05/11/15 1705    Visit Number 3   Number of Visits 12   Date for PT Re-Evaluation 05/25/15   PT Start Time 0431   PT Stop Time 0525   PT Time Calculation (min) 54 min   Activity Tolerance Patient tolerated treatment well;Patient limited by pain   Behavior During Therapy [facility] for tasks assessed/performed      Past Medical History  Diagnosis Date  . Allergy     seasonal allergies  . Pelvic cyst     Seen by gynecologist Dr. Harrington Challenger. Scheduled for ex lap on 12/02/11  . Fibroids   . Headache(784.0)     per patient tx for HA  . Bipolar disorder   . Asthma   . History of blood transfusion 11/2011    [place]   . Ovarian cyst   . Depression     ok currently    Past Surgical History  Procedure Laterality Date  . Myomectomy  12/02/2011    Procedure: MYOMECTOMY;  Surgeon: Gus Height, MD;  Location: [facility];  Service: Gynecology;  Laterality: N/A;  . Abdominal hysterectomy N/A 11/12/2013    Procedure: Abdominal Hysterectomy with bilateral salpingectomy, and Right oophorectomy;  Surgeon: Janyth Contes, MD;  Location: [facility];  Service: Gynecology;  Laterality: N/A;  . Scar revision N/A 11/12/2013    Procedure: SCAR REVISION;  Surgeon: Janyth Contes, MD;  Location: [facility];  Service: Gynecology;  Laterality: N/A;  . Laparoscopy N/A 12/30/2014    Procedure: LAPAROSCOPY OPERATIVE;  Surgeon: Paula Compton, MD;  Location: [facility];  Service: Gynecology;  Laterality: N/A;    There were no vitals filed for this visit.  Visit Diagnosis:  Muscle spasms of both lower extremities  Bilateral low back pain without sciatica  Joint stiffness of spine  Weakness of  trunk musculature      Subjective Assessment - 05/11/15 1650    Subjective NO CHANGES ,. PAIN 8/10 IN LOWER BACK   Currently in Pain? Yes   Pain Score 8    Pain Location Back   Pain Orientation Right;Left;Lower   Pain Descriptors / Indicators Aching   Pain Type Chronic pain   Pain Onset More than a month ago   Pain Frequency Constant   Aggravating Factors  bend , lift   Effect of Pain on Daily Activities rest   Multiple Pain Sites No                         [facility] Adult PT Treatment/Exercise - 05/11/15 1647    Lumbar Exercises: Stretches   Passive Hamstring Stretch 1 rep;30 seconds  RANGE WNL   Single Knee to Chest Stretch 2 reps;30 seconds  RT and LT   Lower Trunk Rotation 20 seconds;2 reps   Lower Trunk Rotation Limitations RT and LT   Pelvic Tilt 10 seconds  12 reps   Lumbar Exercises: Aerobic   Stationary Bike Nustep L5 6 min UE and LE   Lumbar Exercises: Supine   Bent Knee Raise 10 reps   Bent Knee Raise Limitations RT and LT 5 sec hold    Bridge 10 reps   Moist Heat Therapy  Number Minutes Moist Heat 30 Minutes   Moist Heat Location Lumbar Spine   Electrical Stimulation   Electrical Stimulation Location lower thoracic and lum bar spine   Electrical Stimulation Action IFC   Electrical Stimulation Parameters L12   Electrical Stimulation Goals Pain     Close cuing  For exercise. Re taped at end per Pt. Request 2 YS along spine/paraspinals           PT Education - 05/11/15 1704    Education provided Yes   Education Details post pelvic tilt, knee to chest   Person(s) Educated Patient   Methods Explanation;Verbal cues;Handout   Comprehension Returned demonstration          PT Short Term Goals - 04/13/15 1619    PT SHORT TERM GOAL #1   Title She will be independnet with initial HEP   Time 3   Period Weeks   Status New   PT SHORT TERM GOAL #2   Title She will report pain decreased 25% or more    Time 3   Period Weeks   Status  New           PT Long Term Goals - 04/13/15 1620    PT LONG TERM GOAL #1   Title She will be independnet with all hEP issued as of last visit   Time 6   Period Weeks   Status New   PT LONG TERM GOAL #2   Title She will report pain decreased 50% or more in back with home tasks   Time 6   Period Weeks   Status New   PT LONG TERM GOAL #3   Title She will report pain decreased 50% with work tasks.    Time 6   Period Weeks   Status New   PT LONG TERM GOAL #4   Time 6   Period Weeks   Status New               Plan - 05/11/15 1706    Clinical Impression Statement No improvement but she was able to do all exercises with cueing but done well after cues.    PT Next Visit Plan Cont modalites , exercise and manual    PT Home Exercise Plan Stretching   Consulted and Agree with Plan of Care Patient        Problem List Patient Active Problem List   Diagnosis Date Noted  . Wheeze 11/21/2014  . Back pain 11/03/2014  . Pelvic abscess in female 11/24/2013  . S/P TAH (total abdominal hysterectomy) 11/12/2013  . Fibroids, intramural 11/11/2013  . Leiomyoma of uterus 10/06/2013  . Bipolar 2 disorder ([place]) 09/18/2013  . Morbid obesity ([place]) 08/30/2013  . Low back pain 02/09/2013  . Tooth pain 01/12/2013  . Strain of iliopsoas muscle 12/04/2012  . Galactorrhea 07/12/2012  . Migraine headache 07/12/2012  . Paresthesia 07/12/2012  . RHINITIS, ALLERGIC 08/31/2006  . MENSTRUATION, PAINFUL 08/31/2006    Darrel Hoover PT  05/11/2015, 5:08 PM  [facility] [address] [place], [place], [address] Phone: [phone]   Fax:  [phone]  Name: [name] MRN: [id number] Date of Birth: 1986-01-17    PHYSICAL THERAPY DISCHARGE SUMMARY  Visits from Start of Care: 3  Current functional level related to goals / functional outcomes: See above   Remaining deficits: Unknown as pt did not return   Education /  Equipment: HEP  Plan: Patient agrees to discharge.  Patient goals were not met. Patient is being discharged due to not returning since the last visit.  ?????   Laureen Abrahams, PT, DPT 06/24/2015 3:36 PM  [facility] [address] [place] [address]  [phone] (office) [phone] (fax)

## 2015-05-13 ENCOUNTER — Ambulatory Visit: Payer: 59 | Admitting: Physical Therapy

## 2015-05-18 ENCOUNTER — Ambulatory Visit: Payer: 59

## 2015-05-20 ENCOUNTER — Ambulatory Visit: Payer: 59 | Admitting: Physical Therapy

## 2015-05-25 ENCOUNTER — Ambulatory Visit: Payer: 59 | Admitting: Physical Therapy

## 2015-05-27 ENCOUNTER — Encounter: Payer: 59 | Admitting: Physical Therapy

## 2015-06-01 ENCOUNTER — Encounter: Payer: 59 | Admitting: Physical Therapy

## 2015-06-03 ENCOUNTER — Encounter: Payer: 59 | Admitting: Physical Therapy

## 2015-06-08 ENCOUNTER — Encounter: Payer: 59 | Admitting: Physical Therapy

## 2015-06-15 ENCOUNTER — Ambulatory Visit: Payer: 59 | Admitting: Physical Therapy

## 2015-07-07 MED FILL — metroNIDAZOLE 500 MG TABS: 500 | 7 days supply | Qty: 14 | Fill #0

## 2015-07-08 DIAGNOSIS — H5213 Myopia, bilateral: Secondary | ICD-10-CM | POA: Diagnosis not present

## 2015-07-13 DIAGNOSIS — F3181 Bipolar II disorder: Secondary | ICD-10-CM | POA: Diagnosis not present

## 2015-07-13 MED FILL — busPIRone HCL 10 MG TABS: 10 | 30 days supply | Qty: 30 | Fill #0

## 2015-07-13 MED FILL — AMPHETAMINE SALTS 20 MG TAB: 20 | 30 days supply | Qty: 30 | Fill #0

## 2015-07-13 MED FILL — ZIPRASIDONE HCL 60 MG CAP: 60 | 30 days supply | Qty: 30 | Fill #0

## 2015-07-13 MED FILL — clonazePAM 1 MG TABS: 1 | 30 days supply | Qty: 30 | Fill #0

## 2015-08-10 DIAGNOSIS — R1032 Left lower quadrant pain: Secondary | ICD-10-CM | POA: Diagnosis not present

## 2015-08-10 DIAGNOSIS — F3181 Bipolar II disorder: Secondary | ICD-10-CM | POA: Diagnosis not present

## 2015-08-11 MED FILL — CITALOPRAM HBR 20 MG TABLET: 20 | 30 days supply | Qty: 30 | Fill #0

## 2015-08-11 MED FILL — AMPHETAMINE SALTS 30 MG TAB: 30 | 30 days supply | Qty: 30 | Fill #0

## 2015-08-11 MED FILL — clonazePAM 1 MG TABS: 1 | 30 days supply | Qty: 30 | Fill #1

## 2015-08-11 MED FILL — ZIPRASIDONE HCL 60 MG CAP: 60 | 30 days supply | Qty: 30 | Fill #1

## 2015-08-12 DIAGNOSIS — D259 Leiomyoma of uterus, unspecified: Secondary | ICD-10-CM | POA: Diagnosis not present

## 2015-08-12 DIAGNOSIS — N83209 Unspecified ovarian cyst, unspecified side: Secondary | ICD-10-CM | POA: Diagnosis not present

## 2015-08-12 DIAGNOSIS — R102 Pelvic and perineal pain: Secondary | ICD-10-CM | POA: Diagnosis not present

## 2015-08-12 MED FILL — HYDROmorphone HCL 2 MG TABS: 2 | 2 days supply | Qty: 20 | Fill #0

## 2015-08-24 MED FILL — LUPRON DEPOT 11.25 MG 3MO K: 11.25 | 84 days supply | Qty: 1 | Fill #0

## 2015-08-27 MED FILL — NORETHINDRONE 5 MG TABLET: 5 | 30 days supply | Qty: 30 | Fill #0

## 2015-08-28 DIAGNOSIS — D259 Leiomyoma of uterus, unspecified: Secondary | ICD-10-CM | POA: Diagnosis not present

## 2015-09-01 MED FILL — HYDROmorphone HCL 2 MG TABS: 2 | 5 days supply | Qty: 20 | Fill #0

## 2015-09-08 DIAGNOSIS — F3181 Bipolar II disorder: Secondary | ICD-10-CM | POA: Diagnosis not present

## 2015-09-08 MED FILL — ZIPRASIDONE HCL 60 MG CAP: 60 | 30 days supply | Qty: 30 | Fill #0

## 2015-09-08 MED FILL — CITALOPRAM HBR 20 MG TABLET: 20 | 30 days supply | Qty: 30 | Fill #0

## 2015-09-14 MED FILL — DEXTROAMP-AMP 30 MG TABLET: 30 | 30 days supply | Qty: 30 | Fill #0

## 2015-09-14 MED FILL — clonazePAM 1 MG TABS: 1 | 30 days supply | Qty: 30 | Fill #0

## 2015-09-30 DIAGNOSIS — F3181 Bipolar II disorder: Secondary | ICD-10-CM | POA: Diagnosis not present

## 2015-10-01 MED FILL — ZIPRASIDONE HCL 20 MG CAP: 20 | 30 days supply | Qty: 30 | Fill #0

## 2015-10-12 MED FILL — CITALOPRAM HBR 20 MG TABLET: 20 | 30 days supply | Qty: 30 | Fill #1

## 2015-10-12 MED FILL — clonazePAM 1 MG TABS: 1 | 30 days supply | Qty: 30 | Fill #0

## 2015-10-12 MED FILL — DEXTROAMP-AMP 30 MG TABLET: 30 | 30 days supply | Qty: 30 | Fill #0

## 2015-10-13 DIAGNOSIS — R109 Unspecified abdominal pain: Secondary | ICD-10-CM | POA: Diagnosis not present

## 2015-10-14 MED FILL — CYCLOBENZAPRINE 10 MG TAB: 10 | 20 days supply | Qty: 60 | Fill #0

## 2015-10-28 DIAGNOSIS — F3181 Bipolar II disorder: Secondary | ICD-10-CM | POA: Diagnosis not present

## 2015-10-28 MED FILL — PRAZOSIN 2 MG CAPSULE: 2 | 30 days supply | Qty: 30 | Fill #0

## 2015-10-28 MED FILL — AMPHETAMINE SALTS 20 MG TAB: 20 | 30 days supply | Qty: 60 | Fill #0

## 2015-10-28 MED FILL — CITALOPRAM HBR 40 MG TABLET: 40 | 30 days supply | Qty: 30 | Fill #0

## 2015-10-28 MED FILL — clonazePAM 0.5 MG TABS: 0.5 | 30 days supply | Qty: 30 | Fill #0

## 2015-10-29 MED FILL — HALOPERIDOL 1 MG TABLET: 1 | 30 days supply | Qty: 30 | Fill #0

## 2015-11-19 MED FILL — CYCLOBENZAPRINE 10 MG TAB: 10 | 20 days supply | Qty: 60 | Fill #1

## 2015-11-20 MED FILL — HALOPERIDOL 1 MG TABLET: 1 | 30 days supply | Qty: 30 | Fill #1

## 2015-11-20 MED FILL — PRAZOSIN 2 MG CAPSULE: 2 | 30 days supply | Qty: 30 | Fill #1

## 2015-11-20 MED FILL — CITALOPRAM HBR 40 MG TABLET: 40 | 30 days supply | Qty: 30 | Fill #1

## 2015-11-25 MED FILL — clonazePAM 0.5 MG TABS: 0.5 | 30 days supply | Qty: 30 | Fill #1

## 2015-12-01 DIAGNOSIS — F3181 Bipolar II disorder: Secondary | ICD-10-CM | POA: Diagnosis not present

## 2015-12-03 MED FILL — DEXTROAMP-AMPHETAMIN 20 MG: 20 | 30 days supply | Qty: 60 | Fill #0

## 2015-12-09 DIAGNOSIS — F4310 Post-traumatic stress disorder, unspecified: Secondary | ICD-10-CM | POA: Diagnosis not present

## 2015-12-09 DIAGNOSIS — F411 Generalized anxiety disorder: Secondary | ICD-10-CM | POA: Diagnosis not present

## 2015-12-09 DIAGNOSIS — F331 Major depressive disorder, recurrent, moderate: Secondary | ICD-10-CM | POA: Diagnosis not present

## 2015-12-22 DIAGNOSIS — F4310 Post-traumatic stress disorder, unspecified: Secondary | ICD-10-CM | POA: Diagnosis not present

## 2015-12-29 DIAGNOSIS — F3181 Bipolar II disorder: Secondary | ICD-10-CM | POA: Diagnosis not present

## 2015-12-29 MED FILL — hydrOXYzine HCL 10 MG TABS: 10 | 30 days supply | Qty: 60 | Fill #0

## 2015-12-29 MED FILL — PRAZOSIN 2 MG CAPSULE: 2 | 30 days supply | Qty: 60 | Fill #0

## 2015-12-29 MED FILL — HALOPERIDOL 1 MG TABLET: 1 | 30 days supply | Qty: 30 | Fill #0

## 2015-12-29 MED FILL — CITALOPRAM HBR 40 MG TABLET: 40 | 30 days supply | Qty: 30 | Fill #0

## 2015-12-30 MED FILL — clonazePAM 0.5 MG TABS: 0.5 | 30 days supply | Qty: 30 | Fill #0

## 2015-12-31 MED FILL — DEXTROAMP-AMPHETAMIN 20 MG: 20 | 30 days supply | Qty: 60 | Fill #0

## 2016-01-02 DIAGNOSIS — F4310 Post-traumatic stress disorder, unspecified: Secondary | ICD-10-CM | POA: Diagnosis not present

## 2016-01-02 DIAGNOSIS — J45909 Unspecified asthma, uncomplicated: Secondary | ICD-10-CM | POA: Diagnosis not present

## 2016-01-02 DIAGNOSIS — R112 Nausea with vomiting, unspecified: Secondary | ICD-10-CM | POA: Diagnosis not present

## 2016-01-02 DIAGNOSIS — Z883 Allergy status to other anti-infective agents status: Secondary | ICD-10-CM | POA: Diagnosis not present

## 2016-01-02 DIAGNOSIS — Z886 Allergy status to analgesic agent status: Secondary | ICD-10-CM | POA: Diagnosis not present

## 2016-01-02 DIAGNOSIS — F419 Anxiety disorder, unspecified: Secondary | ICD-10-CM | POA: Diagnosis not present

## 2016-01-02 DIAGNOSIS — R1110 Vomiting, unspecified: Secondary | ICD-10-CM | POA: Diagnosis not present

## 2016-01-02 DIAGNOSIS — Z880 Allergy status to penicillin: Secondary | ICD-10-CM | POA: Diagnosis not present

## 2016-01-02 DIAGNOSIS — R079 Chest pain, unspecified: Secondary | ICD-10-CM | POA: Diagnosis not present

## 2016-01-02 DIAGNOSIS — N809 Endometriosis, unspecified: Secondary | ICD-10-CM | POA: Diagnosis not present

## 2016-01-02 DIAGNOSIS — R1013 Epigastric pain: Secondary | ICD-10-CM | POA: Diagnosis not present

## 2016-01-11 DIAGNOSIS — F4310 Post-traumatic stress disorder, unspecified: Secondary | ICD-10-CM | POA: Diagnosis not present

## 2016-01-11 DIAGNOSIS — F331 Major depressive disorder, recurrent, moderate: Secondary | ICD-10-CM | POA: Diagnosis not present

## 2016-01-28 DIAGNOSIS — F4310 Post-traumatic stress disorder, unspecified: Secondary | ICD-10-CM | POA: Diagnosis not present

## 2016-01-28 DIAGNOSIS — F3181 Bipolar II disorder: Secondary | ICD-10-CM | POA: Diagnosis not present

## 2016-01-28 DIAGNOSIS — F411 Generalized anxiety disorder: Secondary | ICD-10-CM | POA: Diagnosis not present

## 2016-01-28 DIAGNOSIS — F902 Attention-deficit hyperactivity disorder, combined type: Secondary | ICD-10-CM | POA: Diagnosis not present

## 2016-01-29 MED FILL — DEXTROAMP-AMPHETAMIN 20 MG: 20 | 30 days supply | Qty: 60 | Fill #0

## 2016-01-29 MED FILL — CITALOPRAM HBR 40 MG TABLET: 40 | 30 days supply | Qty: 30 | Fill #0

## 2016-01-29 MED FILL — clonazePAM 0.5 MG TABS: 0.5 | 30 days supply | Qty: 30 | Fill #0

## 2016-01-29 MED FILL — hydrOXYzine HCL 10 MG TABS: 10 | 30 days supply | Qty: 60 | Fill #0

## 2016-01-29 MED FILL — HALOPERIDOL 1 MG TABLET: 1 | 30 days supply | Qty: 30 | Fill #0

## 2016-01-29 MED FILL — PRAZOSIN 2 MG CAPSULE: 2 | 30 days supply | Qty: 60 | Fill #0

## 2016-03-10 MED FILL — PRAZOSIN 2 MG CAPSULE: 2 | 30 days supply | Qty: 60 | Fill #1

## 2016-03-10 MED FILL — clonazePAM 0.5 MG TABS: 0.5 | 30 days supply | Qty: 30 | Fill #1

## 2016-03-10 MED FILL — hydrOXYzine HCL 10 MG TABS: 10 | 30 days supply | Qty: 60 | Fill #1

## 2016-03-10 MED FILL — HALOPERIDOL 1 MG TABLET: 1 | 30 days supply | Qty: 30 | Fill #1

## 2016-03-10 MED FILL — CITALOPRAM HBR 40 MG TABLET: 40 | 30 days supply | Qty: 30 | Fill #1

## 2016-03-11 MED FILL — DEXTROAMP-AMPHETAMIN 20 MG: 20 | 30 days supply | Qty: 60 | Fill #0

## 2016-03-24 DIAGNOSIS — F411 Generalized anxiety disorder: Secondary | ICD-10-CM | POA: Diagnosis not present

## 2016-03-24 DIAGNOSIS — F902 Attention-deficit hyperactivity disorder, combined type: Secondary | ICD-10-CM | POA: Diagnosis not present

## 2016-03-24 DIAGNOSIS — F3181 Bipolar II disorder: Secondary | ICD-10-CM | POA: Diagnosis not present

## 2016-03-24 DIAGNOSIS — F4310 Post-traumatic stress disorder, unspecified: Secondary | ICD-10-CM | POA: Diagnosis not present

## 2016-03-24 MED FILL — PRAZOSIN 5 MG CAPSULE: 5 | 30 days supply | Qty: 30 | Fill #0

## 2016-03-24 MED FILL — hydrOXYzine HCL 25 MG TABS: 25 | 30 days supply | Qty: 60 | Fill #0

## 2016-03-25 MED FILL — HALOPERIDOL 2 MG TABLET: 2 | 30 days supply | Qty: 30 | Fill #0

## 2016-04-04 DIAGNOSIS — M545 Low back pain: Secondary | ICD-10-CM | POA: Diagnosis not present

## 2016-04-04 DIAGNOSIS — M546 Pain in thoracic spine: Secondary | ICD-10-CM | POA: Diagnosis not present

## 2016-04-04 MED FILL — clonazePAM 0.5 MG TABS: 0.5 | 30 days supply | Qty: 30 | Fill #0

## 2016-04-04 MED FILL — METHOCARBAMOL 500 MG TABLET: 500 | 9 days supply | Qty: 30 | Fill #0

## 2016-04-04 MED FILL — DEXTROAMP-AMPHETAMIN 20 MG: 20 | 30 days supply | Qty: 60 | Fill #0

## 2016-04-15 DIAGNOSIS — F902 Attention-deficit hyperactivity disorder, combined type: Secondary | ICD-10-CM | POA: Diagnosis not present

## 2016-04-15 DIAGNOSIS — F411 Generalized anxiety disorder: Secondary | ICD-10-CM | POA: Diagnosis not present

## 2016-04-15 DIAGNOSIS — F4310 Post-traumatic stress disorder, unspecified: Secondary | ICD-10-CM | POA: Diagnosis not present

## 2016-04-15 DIAGNOSIS — F3181 Bipolar II disorder: Secondary | ICD-10-CM | POA: Diagnosis not present

## 2016-04-21 DIAGNOSIS — F902 Attention-deficit hyperactivity disorder, combined type: Secondary | ICD-10-CM | POA: Diagnosis not present

## 2016-04-21 DIAGNOSIS — F429 Obsessive-compulsive disorder, unspecified: Secondary | ICD-10-CM | POA: Diagnosis not present

## 2016-04-21 DIAGNOSIS — F4310 Post-traumatic stress disorder, unspecified: Secondary | ICD-10-CM | POA: Diagnosis not present

## 2016-05-05 MED FILL — BUPROPION HCL XL 150 MG TAB: 150 | 30 days supply | Qty: 30 | Fill #0

## 2016-05-05 MED FILL — hydrOXYzine HCL 25 MG TABS: 25 | 30 days supply | Qty: 60 | Fill #1

## 2016-05-05 MED FILL — METHOCARBAMOL 500 MG TABLET: 500 | 9 days supply | Qty: 30 | Fill #1

## 2016-05-05 MED FILL — CITALOPRAM HBR 40 MG TABLET: 40 | 30 days supply | Qty: 30 | Fill #0

## 2016-05-05 MED FILL — AMPHETAMINE SALTS 20 MG TAB: 20 | 30 days supply | Qty: 60 | Fill #0

## 2016-05-05 MED FILL — HALOPERIDOL 2 MG TABLET: 2 | 30 days supply | Qty: 30 | Fill #1

## 2016-05-05 MED FILL — clonazePAM 0.5 MG TABS: 0.5 | 30 days supply | Qty: 30 | Fill #1

## 2016-05-05 MED FILL — PRAZOSIN 2 MG CAPSULE: 2 | 30 days supply | Qty: 60 | Fill #1

## 2016-05-10 DIAGNOSIS — F4310 Post-traumatic stress disorder, unspecified: Secondary | ICD-10-CM | POA: Diagnosis not present

## 2016-05-10 DIAGNOSIS — F902 Attention-deficit hyperactivity disorder, combined type: Secondary | ICD-10-CM | POA: Diagnosis not present

## 2016-05-10 DIAGNOSIS — F411 Generalized anxiety disorder: Secondary | ICD-10-CM | POA: Diagnosis not present

## 2016-05-10 DIAGNOSIS — F3181 Bipolar II disorder: Secondary | ICD-10-CM | POA: Diagnosis not present

## 2016-05-11 ENCOUNTER — Encounter: Payer: Self-pay | Admitting: Physical Medicine & Rehabilitation

## 2016-05-12 DIAGNOSIS — F331 Major depressive disorder, recurrent, moderate: Secondary | ICD-10-CM | POA: Diagnosis not present

## 2016-05-12 DIAGNOSIS — F4310 Post-traumatic stress disorder, unspecified: Secondary | ICD-10-CM | POA: Diagnosis not present

## 2016-06-16 ENCOUNTER — Ambulatory Visit: Payer: 59 | Admitting: Physical Medicine & Rehabilitation

## 2016-06-17 MED FILL — BUPROPION HCL XL 300 MG TAB: 300 | 30 days supply | Qty: 30 | Fill #0

## 2016-06-17 MED FILL — hydrOXYzine HCL 25 MG TABS: 25 | 30 days supply | Qty: 60 | Fill #0

## 2016-06-17 MED FILL — PRAZOSIN 5 MG CAPSULE: 5 | 30 days supply | Qty: 30 | Fill #0

## 2016-06-17 MED FILL — clonazePAM 0.5 MG TABS: 0.5 | 30 days supply | Qty: 30 | Fill #0

## 2016-06-17 MED FILL — HALOPERIDOL 2 MG TABLET: 2 | 30 days supply | Qty: 30 | Fill #0

## 2016-06-17 MED FILL — DEXTROAMP-AMPHETAMIN 20 MG: 20 | 30 days supply | Qty: 60 | Fill #0

## 2016-06-17 MED FILL — CITALOPRAM HBR 40 MG TABLET: 40 | 30 days supply | Qty: 30 | Fill #0

## 2016-07-05 ENCOUNTER — Ambulatory Visit: Payer: 59 | Admitting: Physical Medicine & Rehabilitation

## 2016-07-05 ENCOUNTER — Encounter: Payer: 59 | Attending: Physical Medicine & Rehabilitation

## 2016-07-05 DIAGNOSIS — F411 Generalized anxiety disorder: Secondary | ICD-10-CM | POA: Diagnosis not present

## 2016-07-05 DIAGNOSIS — F902 Attention-deficit hyperactivity disorder, combined type: Secondary | ICD-10-CM | POA: Diagnosis not present

## 2016-07-05 DIAGNOSIS — F3181 Bipolar II disorder: Secondary | ICD-10-CM | POA: Diagnosis not present

## 2016-07-05 DIAGNOSIS — F4310 Post-traumatic stress disorder, unspecified: Secondary | ICD-10-CM | POA: Diagnosis not present

## 2016-07-22 MED FILL — CITALOPRAM HBR 40 MG TABLET: 40 | 30 days supply | Qty: 30 | Fill #0

## 2016-07-22 MED FILL — AMPHETAMINE SALTS 20 MG TAB: 20 | 30 days supply | Qty: 60 | Fill #0

## 2016-07-22 MED FILL — HALOPERIDOL 2 MG TABLET: 2 | 30 days supply | Qty: 30 | Fill #0

## 2016-07-22 MED FILL — PRAZOSIN 5 MG CAPSULE: 5 | 30 days supply | Qty: 30 | Fill #0

## 2016-07-22 MED FILL — BUPROPION HCL XL 300 MG TAB: 300 | 30 days supply | Qty: 30 | Fill #0

## 2016-07-22 MED FILL — hydrOXYzine HCL 25 MG TABS: 25 | 30 days supply | Qty: 60 | Fill #0

## 2016-07-22 MED FILL — clonazePAM 0.5 MG TABS: 0.5 | 30 days supply | Qty: 30 | Fill #1

## 2016-08-11 ENCOUNTER — Encounter: Payer: 59 | Attending: Physical Medicine & Rehabilitation

## 2016-08-11 ENCOUNTER — Ambulatory Visit (HOSPITAL_BASED_OUTPATIENT_CLINIC_OR_DEPARTMENT_OTHER): Payer: 59 | Admitting: Physical Medicine & Rehabilitation

## 2016-08-11 ENCOUNTER — Encounter: Payer: Self-pay | Admitting: Physical Medicine & Rehabilitation

## 2016-08-11 VITALS — BP 114/76 | HR 104 | Temp 98.0°F | Resp 14

## 2016-08-11 DIAGNOSIS — M545 Low back pain: Secondary | ICD-10-CM

## 2016-08-11 DIAGNOSIS — Z5181 Encounter for therapeutic drug level monitoring: Secondary | ICD-10-CM

## 2016-08-11 MED ORDER — PREGABALIN 50 MG PO CAPS: 50.0000 mg | ORAL_CAPSULE | Freq: Two times a day (BID) | ORAL | 0 refills | Status: DC

## 2016-08-11 MED FILL — LYRICA 50 MG CAPSULE: 50 | 30 days supply | Qty: 60 | Fill #0

## 2016-08-11 NOTE — Progress Notes (Signed)
Subjective:    Patient ID: [name], female    DOB: [birth year], 31 y.o.   MRN: [id number]  HPI  Pain Inventory Average Pain 10 Pain Right Now 8 My pain is sharp, stabbing, tingling and aching  In the last 24 hours, has pain interfered with the following? General activity 7 Relation with others 8 Enjoyment of life 8 What TIME of day is your pain at its worst? all Sleep (in general) Fair  Pain is worse with: n/a Pain improves with: heat/ice, injections and burning Relief from Meds: 7  Mobility do you drive?  no Do you have any goals in this area?  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  no  Physicians involved in your care Any changes since last visit?  no   Family History  Problem Relation Age of Onset  . Heart disease Mother   . Diabetes Paternal Aunt    Social History   Social History  . Marital status: Single    Spouse name: N/A  . Number of children: N/A  . Years of education: N/A   Social History Main Topics  . Smoking status: Never Smoker  . Smokeless tobacco: Never Used  . Alcohol use No  . Drug use: No  . Sexual activity: Not Currently    Birth control/ protection: None, Surgical   Other Topics Concern  . Not on file   Social History Narrative   Lives alone. 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    Past Surgical History:  Procedure Laterality Date  . ABDOMINAL HYSTERECTOMY N/A 11/12/2013   Procedure: Abdominal Hysterectomy with bilateral salpingectomy, and Right oophorectomy;  Surgeon: Janyth Contes, MD;  Location: [facility];  Service: Gynecology;  Laterality: N/A;  . LAPAROSCOPY N/A 12/30/2014   Procedure: LAPAROSCOPY OPERATIVE;  Surgeon: Paula Compton, MD;   Location: [facility];  Service: Gynecology;  Laterality: N/A;  . MYOMECTOMY  12/02/2011   Procedure: MYOMECTOMY;  Surgeon: Gus Height, MD;  Location: [facility];  Service: Gynecology;  Laterality: N/A;  . SCAR REVISION N/A 11/12/2013   Procedure: SCAR REVISION;  Surgeon: Janyth Contes, MD;  Location: [facility];  Service: Gynecology;  Laterality: N/A;   Past Medical History:  Diagnosis Date  . Allergy    seasonal allergies  . Asthma   . Bipolar disorder   . Depression    ok currently  . Fibroids   . Headache(784.0)    per patient tx for HA  . History of blood transfusion 11/2011   [place]   . Ovarian cyst   . Pelvic cyst    Seen by gynecologist Dr. Harrington Challenger. Scheduled for ex lap on 12/02/11   LMP 09/29/2013   Opioid Risk Score:   Fall Risk Score:  `1  Depression screen PHQ 2/9  Depression screen [facility] 2/9 10/02/2013 08/30/2013 08/30/2013 02/09/2013  Decreased Interest 2 3 0 0  Down, Depressed, Hopeless 2 3 0 2  PHQ - 2 Score 4 6 0 2  Altered sleeping 2 3 - 1  Tired, decreased energy 1 3 - 1  Change in appetite 2 3 - 3  Feeling bad or failure about yourself  1 3 - 3  Trouble concentrating 2 2 - 1  Moving slowly or fidgety/restless 1 2 -  2  Suicidal thoughts 1 1 - 0  PHQ-9 Score 14 23 - 13    Review of Systems  Constitutional: Positive for unexpected weight change.       Night sweats  HENT: Negative.   Eyes: Negative.   Respiratory: Negative.   Cardiovascular: Negative.   Gastrointestinal: Negative.   Endocrine: Negative.   Genitourinary: Negative.   Musculoskeletal: Negative.   Skin: Negative.   Allergic/Immunologic: Negative.   Neurological: Negative.   Hematological: Negative.   Psychiatric/Behavioral: Negative.   All other systems reviewed and are negative.      Objective:   Physical Exam        Assessment & Plan:

## 2016-08-11 NOTE — Progress Notes (Signed)
Subjective:    [name] is a 31 y.o. female who presents for evaluation of low back pain. The patient has had recurrent self limited episodes of low back pain in the past. Symptoms have been present for several years and are gradually worsening.  Onset was related to / precipitated by no known injury. The pain is located in the across the lower back and radiates to the right hip. The pain is described as sharp and throbbing and occurs all day. She rates her pain as severe and "bearable". Symptoms are exacerbated by sitting. Symptoms are improved by heat. She has also tried PT which provided no symptom relief. She has tingling in the right leg associated with the back pain. The patient has no "red flag" history indicative of complicated back pain.  See above for Fam, [place] and social See separate note from today.  Imaging MRI CERVICAL AND thoracic SPINE WITHOUT AND WITH CONTRAST  TECHNIQUE: Multiplanar and multiecho pulse sequences of the cervical spine, to include the craniocervical junction and cervicothoracic junction, and thoracic spine, were obtained without and with intravenous contrast.  CONTRAST:  57mL MULTIHANCE GADOBENATE DIMEGLUMINE 529 MG/ML IV SOLN  COMPARISON:  Thoracic radiographs same day  FINDINGS: MRI CERVICAL SPINE FINDINGS  Alignment is normal. There is no traumatic finding. There is no inflammatory finding. No degenerative disease. The spinal canal and foramina are widely patent. Question thyromegaly. This is not well evaluated using this technique. The cervical spinal cord appears normal. No abnormal contrast enhancement. Small bit of fatty change in the marrow of the inferior C7 vertebral body is not significant.  MRI thoracic SPINE FINDINGS  Minimal curvature convex to the left. No fracture. No inflammatory change. No degenerative changes. Wide patency of the canal and foramina. No cord lesion. Paravertebral soft tissues appear  normal.  IMPRESSION: No acute or significant finding of the cervical or thoracic spine. No cause of severe back pain identified.  Question thyromegaly. This is not well evaluated, the thyroid gland only being partially visualized. If there is no concern by physical exam, this does not necessarily require follow-up.   Electronically Signed   By: Nelson Chimes M.D.   On: 10/31/2014 15:21  CLINICAL DATA:  Chronic low back pain radiating to the right hip, no trauma  EXAM: LUMBAR SPINE - COMPLETE 4+ VIEW  COMPARISON:  CT abdomen pelvis of 04/04/2013  FINDINGS: The lumbar vertebrae are normal alignment. Intervertebral disc spaces appear normal. No compression deformity is seen. No pars defect is noted. The SI joints appear corticated.  IMPRESSION: Normal alignment.  Normal disc spaces.   Electronically Signed   By: Ivar Drape M.D.   On: 09/05/2013 09:15 Review of Systems see separate note    Objective:   General: No acute distress Mood and affect are appropriate Heart: Regular rate and rhythm no rubs murmurs or extra sounds Lungs: Clear to auscultation, breathing unlabored, no rales or wheezes Abdomen: Positive bowel sounds, soft nontender to palpation, nondistended Extremities: No clubbing, cyanosis, or edema Skin: No evidence of breakdown, no evidence of rash  Musculoskeletal: Full range of motion in all 4 extremities. No joint swelling Lumbar spine has no tenderness. Palpation below L4. There is some tenderness around L1, L2, L3, and more prominently around T3, T4, T5, T6 paraspinals. No tenderness over the greater trochanters of the hip    Neuro:  Eyes without evidence of nystagmus  Tone is normal without evidence of spasticity Cerebellar exam shows no evidence of ataxia on finger  nose finger or heel to shin testing No evidence of trunkal ataxia  Motor strength is 5/5 in bilateral deltoid, biceps, triceps, finger flexors and extensors, wrist flexors  and extensors, hip flexors, knee flexors and extensors, ankle dorsiflexors, plantar flexors, invertors and evertors, toe flexors and extensors  Sensory exam is normal to pinprick, proprioception and light touch in the upper and lower limbs , bilateral C5, C6, C7, C8, L2, L3, L4, L5, S1 dermatomal distribution       Assessment:    Thoracic and upper lumbar pain with exam most consistent with myofascial pain/fibromyalgia syndrome.    Plan:    Natural history and expected course discussed. Questions answered. Neurosurgeon distributed. Follow-up in 1 months. , nurse practitioner visit. Lyrica 50 mg twice a day, may need to slowly titrate upward to 75 mg twice a day and may eventually need as high as 225 twice a day. We'll titrate up on a monthly basis. No need for interventional procedure at the current time. Encourage activity, ambulation 30 minutes at least every other day No need for additional spine imaging at the current time Patient may benefit from Cymbalta. However, would need clearance from psychiatry and may need to switch from Celexa to Cymbalta. She is on multiple psychiatric medications including Klonopin, Haldol, Adderall, Wellbutrin,lamotrigine

## 2016-08-11 NOTE — Patient Instructions (Signed)
Myofascial Pain Syndrome and Fibromyalgia Introduction Myofascial pain syndrome and fibromyalgia are both pain disorders. This pain may be felt mainly in your muscles.  Myofascial pain syndrome:  Always has trigger points or tender points in the muscle that will cause pain when pressed. The pain may come and go.  Usually affects your neck, upper back, and shoulder areas. The pain often radiates into your arms and hands.  Fibromyalgia:  Has muscle pains and tenderness that come and go.  Is often associated with fatigue and sleep disturbances.  Has trigger points.  Tends to be long-lasting (chronic), but is not life-threatening. Fibromyalgia and myofascial pain are not the same. However, they often occur together. If you have both conditions, each can make the other worse. Both are common and can cause enough pain and fatigue to make day-to-day activities difficult. What are the causes? The exact causes of fibromyalgia and myofascial pain are not known. People with certain gene types may be more likely to develop fibromyalgia. Some factors can be triggers for both conditions, such as:  Spine disorders.  Arthritis.  Severe injury (trauma) and other physical stressors.  Being under a lot of stress.  A medical illness. What are the signs or symptoms? Fibromyalgia  The main symptom of fibromyalgia is widespread pain and tenderness in your muscles. This can vary over time. Pain is sometimes described as stabbing, shooting, or burning. You may have tingling or numbness, too. You may also have sleep problems and fatigue. You may wake up feeling tired and groggy (fibro fog). Other symptoms may include:  Bowel and bladder problems.  Headaches.  Visual problems.  Problems with odors and noises.  Depression or mood changes.  Painful menstrual periods (dysmenorrhea).  Dry skin or eyes. Myofascial pain syndrome  Symptoms of myofascial pain syndrome include:  Tight, ropy bands of  muscle.  Uncomfortable sensations in muscular areas, such as:  Aching.  Cramping.  Burning.  Numbness.  Tingling.  Muscle weakness.  Trouble moving certain muscles freely (range of motion). How is this diagnosed? There are no specific tests to diagnose fibromyalgia or myofascial pain syndrome. Both can be hard to diagnose because their symptoms are common in many other conditions. Your health care provider may suspect one or both of these conditions based on your symptoms and medical history. Your health care provider will also do a physical exam. The key to diagnosing fibromyalgia is having pain, fatigue, and other symptoms for more than three months that cannot be explained by another condition. The key to diagnosing myofascial pain syndrome is finding trigger points in muscles that are tender and cause pain elsewhere in your body (referred pain). How is this treated? Treating fibromyalgia and myofascial pain often requires a team of health care providers. This usually starts with your primary provider and a physical therapist. You may also find it helpful to work with alternative health care providers, such as massage therapists or acupuncturists. Treatment for fibromyalgia may include medicines. This may include nonsteroidal anti-inflammatory drugs (NSAIDs), along with other medicines. Treatment for myofascial pain may also include:  NSAIDs.  Cooling and stretching of muscles.  Trigger point injections.  Sound wave (ultrasound) treatments to stimulate muscles. Follow these instructions at home:  Take medicines only as directed by your health care provider.  Exercise as directed by your health care provider or physical therapist.  Try to avoid stressful situations.  Practice relaxation techniques to control your stress. You may want to try:  Biofeedback.  Visual imagery.    Hypnosis.  Muscle relaxation.  Yoga.  Meditation.  Talk to your health care provider  about alternative treatments, such as acupuncture or massage treatment.  Maintain a healthy lifestyle. This includes eating a healthy diet and getting enough sleep.  Consider joining a support group.  Do not do activities that stress or strain your muscles. That includes repetitive motions and heavy lifting. Where to find more information:  National Fibromyalgia Association: www.fmaware.org  Arthritis Foundation: www.arthritis.org  American Chronic Pain Association: www.theacpa.org/condition/myofascial-pain Contact a health care provider if:  You have new symptoms.  Your symptoms get worse.  You have side effects from your medicines.  You have trouble sleeping.  Your condition is causing depression or anxiety. This information is not intended to replace advice given to you by your health care provider. Make sure you discuss any questions you have with your health care provider. Document Released: 06/20/2005 Document Revised: 11/26/2015 Document Reviewed: 03/26/2014  2017 Elsevier  

## 2016-08-18 LAB — TOXASSURE SELECT,+ANTIDEPR,UR

## 2016-08-19 ENCOUNTER — Telehealth: Payer: Self-pay | Admitting: *Deleted

## 2016-08-19 NOTE — Telephone Encounter (Signed)
Urine drug screen for this encounter is inconsistent for prescribed medication, Lab report indicates undeclared amounts of tramadol metabolite. I contacted the patient for clarification.  She states she may have taken tramadol from a medication bottle prescribed some time ago.  It is confirmed on [place] that she had received tramadol back in 2016.  Please advise

## 2016-08-19 NOTE — Progress Notes (Addendum)
Urine drug screen for this encounter is inconsistent for prescribed medication, Lab report indicates undeclared amounts of tramadol metabolite. According to telephone conversation, Dr. Letta Pate said he is not too concerned, however, he asks to monitor patient closely

## 2016-08-22 NOTE — Telephone Encounter (Signed)
I'm not too concerned with this, but would monitor her

## 2016-08-25 MED FILL — PRAZOSIN 5 MG CAPSULE: 5 | 30 days supply | Qty: 30 | Fill #1

## 2016-08-25 MED FILL — hydrOXYzine HCL 25 MG TABS: 25 | 30 days supply | Qty: 60 | Fill #1

## 2016-08-25 MED FILL — CITALOPRAM HBR 40 MG TABLET: 40 | 30 days supply | Qty: 30 | Fill #1

## 2016-08-25 MED FILL — HALOPERIDOL 2 MG TABLET: 2 | 30 days supply | Qty: 30 | Fill #1

## 2016-08-25 MED FILL — BUPROPION HCL XL 300 MG TAB: 300 | 30 days supply | Qty: 30 | Fill #1

## 2016-08-25 MED FILL — clonazePAM 0.5 MG TABS: 0.5 | 30 days supply | Qty: 30 | Fill #0

## 2016-08-26 ENCOUNTER — Telehealth: Payer: Self-pay

## 2016-08-26 NOTE — Telephone Encounter (Signed)
Patient called, states lyrica is not working for her and wants to know what to do now for her increased pain, please advise

## 2016-08-29 ENCOUNTER — Telehealth: Payer: 59 | Admitting: Nurse Practitioner

## 2016-08-29 DIAGNOSIS — M5441 Lumbago with sciatica, right side: Secondary | ICD-10-CM | POA: Diagnosis not present

## 2016-08-29 MED ORDER — NAPROXEN 500 MG PO TABS: 500.0000 mg | ORAL_TABLET | Freq: Two times a day (BID) | ORAL | 1 refills | Status: DC

## 2016-08-29 MED FILL — NAPROXEN 500 MG TABLET: 500 | 30 days supply | Qty: 60 | Fill #0

## 2016-08-29 NOTE — Telephone Encounter (Signed)
Increase the Lyrica to 3 times per day. If this does not help with the pain after one or 2 weeks, may need to increase the dosage

## 2016-08-29 NOTE — Progress Notes (Signed)
We are sorry that you are not feeling well.  Here is how we plan to help!  Based on what you have shared with me it looks like you mostly have acute back pain.  Acute back pain is defined as musculoskeletal pain that can resolve in 1-3 weeks with conservative treatment.  I have prescribed Naprosyn 500 mg twice a day non-steroid anti-inflammatory (NSAID).  Some patients experience stomach irritation or in increased heartburn with anti-inflammatory drugs.  Please keep in mindYou are already on muscle relaxor that was called in om 08/11/16. This is all I have to offer.  that muscle relaxer's can cause fatigue and should not be taken while at work or driving.  Back pain is very common.  The pain often gets better over time.  The cause of back pain is usually not dangerous.  Most people can learn to manage their back pain on their own.  Home Care  Stay active.  Start with short walks on flat ground if you can.  Try to walk farther each day.  Do not sit, drive or stand in one place for more than 30 minutes.  Do not stay in bed.  Do not avoid exercise or work.  Activity can help your back heal faster.  Be careful when you bend or lift an object.  Bend at your knees, keep the object close to you, and do not twist.  Sleep on a firm mattress.  Lie on your side, and bend your knees.  If you lie on your back, put a pillow under your knees.  Only take medicines as told by your doctor.  Put ice on the injured area.  Put ice in a plastic bag  Place a towel between your skin and the bag  Leave the ice on for 15-20 minutes, 3-4 times a day for the first 2-3 days. 210 After that, you can switch between ice and heat packs.  Ask your doctor about back exercises or massage.  Avoid feeling anxious or stressed.  Find good ways to deal with stress, such as exercise.  Get Help Right Way If:  Your pain does not go away with rest or medicine.  Your pain does not go away in 1 week.  You have new  problems.  You do not feel well.  The pain spreads into your legs.  You cannot control when you poop (bowel movement) or pee (urinate)  You feel sick to your stomach (nauseous) or throw up (vomit)  You have belly (abdominal) pain.  You feel like you may pass out (faint).  If you develop a fever.  Make Sure you:  Understand these instructions.  Will watch your condition  Will get help right away if you are not doing well or get worse.  Your e-visit answers were reviewed by a board certified advanced clinical practitioner to complete your personal care plan.  Depending on the condition, your plan could have included both over the counter or prescription medications.  If there is a problem please reply  once you have received a response from your provider.  Your safety is important to [place].  If you have drug allergies check your prescription carefully.    You can use MyChart to ask questions about today's visit, request a non-urgent call back, or ask for a work or school excuse for 24 hours related to this e-Visit. If it has been greater than 24 hours you will need to follow up with your provider, or enter a new  e-Visit to address those concerns.  You will get an e-mail in the next two days asking about your experience.  I hope that your e-visit has been valuable and will speed your recovery. Thank you for using e-visits.   

## 2016-08-30 MED ORDER — PREGABALIN 50 MG PO CAPS: 50.0000 mg | ORAL_CAPSULE | Freq: Three times a day (TID) | ORAL | 0 refills | Status: DC

## 2016-08-30 NOTE — Telephone Encounter (Signed)
Called to pharmacy and [facility].

## 2016-08-31 DIAGNOSIS — F902 Attention-deficit hyperactivity disorder, combined type: Secondary | ICD-10-CM | POA: Diagnosis not present

## 2016-08-31 DIAGNOSIS — F3181 Bipolar II disorder: Secondary | ICD-10-CM | POA: Diagnosis not present

## 2016-08-31 DIAGNOSIS — F4310 Post-traumatic stress disorder, unspecified: Secondary | ICD-10-CM | POA: Diagnosis not present

## 2016-08-31 DIAGNOSIS — F411 Generalized anxiety disorder: Secondary | ICD-10-CM | POA: Diagnosis not present

## 2016-09-02 MED FILL — AMPHETAMINE SALTS 20 MG TAB: 20 | 30 days supply | Qty: 60 | Fill #0

## 2016-09-07 IMAGING — MR MR CERVICAL SPINE WO/W CM
7 of 17 series · 19 of 48 positions shown · IV contrast (20    MULTIHANCE)
Comparison: Thoracic radiographs same day

CLINICAL DATA: Neck pain and back pain, 3 days duration. Tingling
of the hands and feet. Pain is severe.

EXAM:
MRI CERVICAL AND thoracic SPINE WITHOUT AND WITH CONTRAST
TECHNIQUE: Multiplanar and multiecho pulse sequences of the cervical spine, to
include the craniocervical junction and cervicothoracic junction,
and thoracic spine, were obtained without and with intravenous
contrast.
CONTRAST:  20mL MULTIHANCE GADOBENATE DIMEGLUMINE 529 MG/ML IV SOLN

[Series 3: T2 · sagittal · 3.0mm · 0.43mm/px · 1 of 13 slices shown (1 of 4)]
[im 1/13]
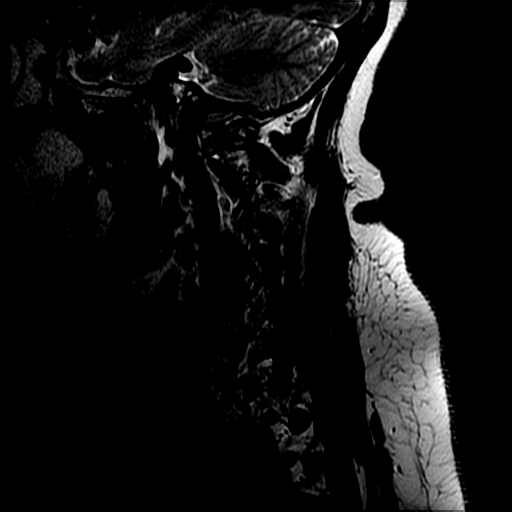

[Series 4: T1 · sagittal · 3.0mm · 0.43mm/px · 2 of 13 slices shown (1 of 3)]
[im 1/13]
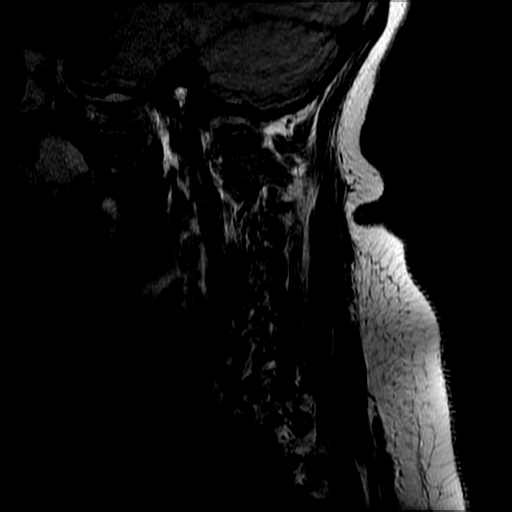
[im 13/13]
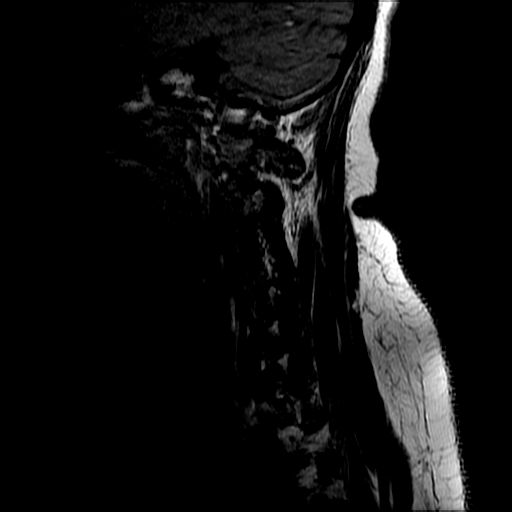

[Series 7: T2 · axial · 3.0mm · 0.39mm/px · z∈[+27,+117]mm · 4 of 28 slices shown (2 of 4)]
[im 1/28]
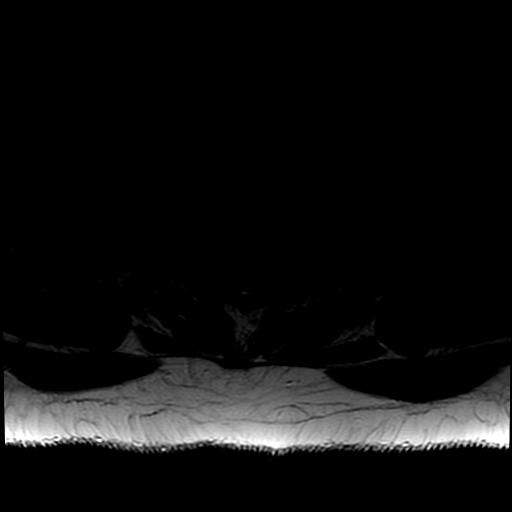
[im 10/28]
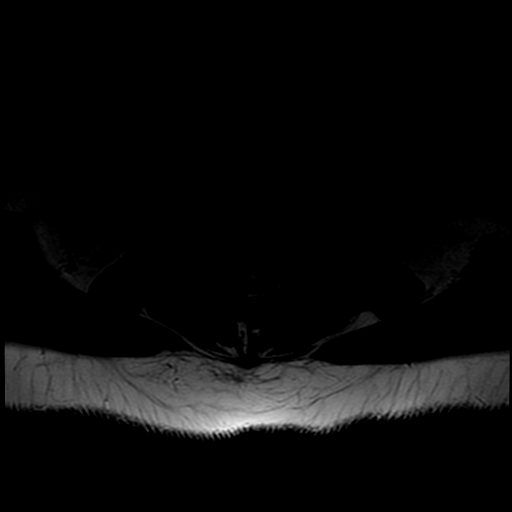
[im 19/28]
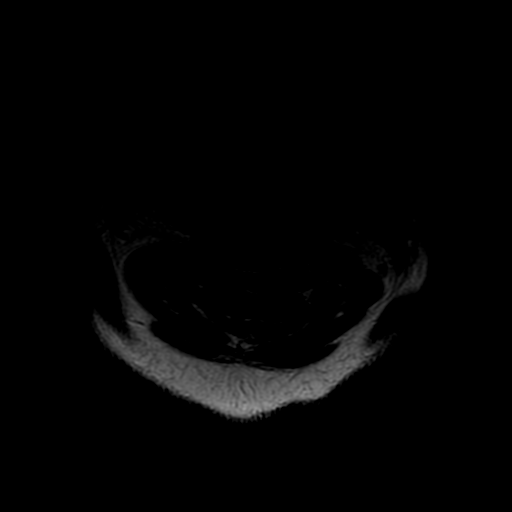
[im 28/28]
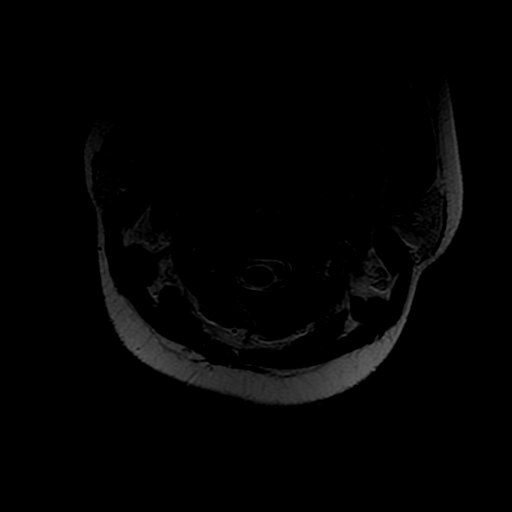

[Series 8: T1 · axial · non-contrast · 3.0mm · 0.39mm/px · z∈[+27,+117]mm · 4 of 28 slices shown (2 of 3)]
[im 1/28]
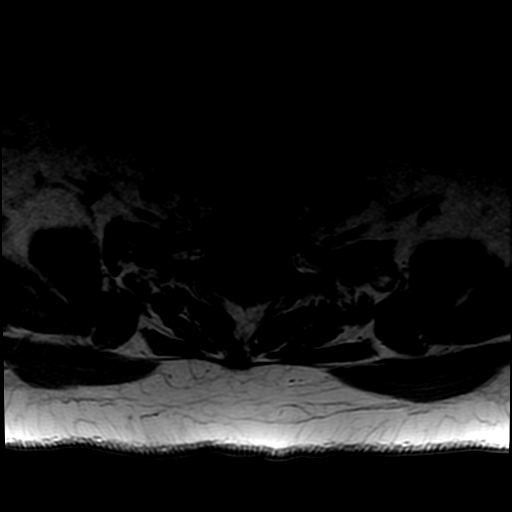
[im 10/28]
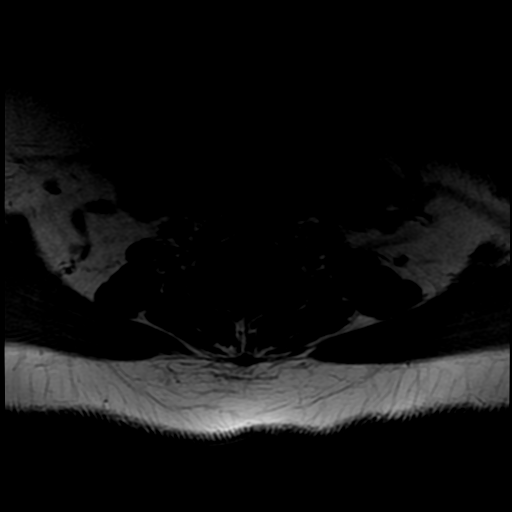
[im 19/28]
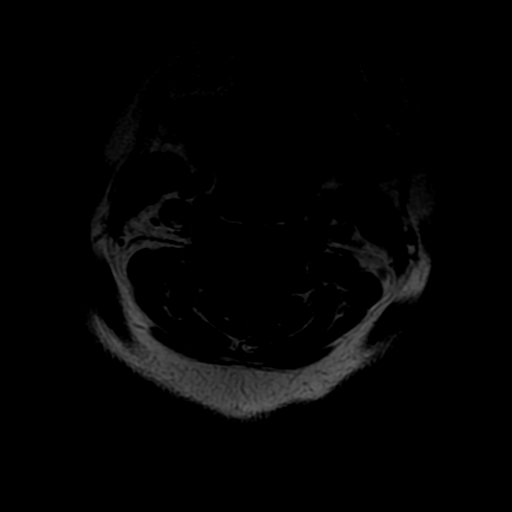
[im 28/28]
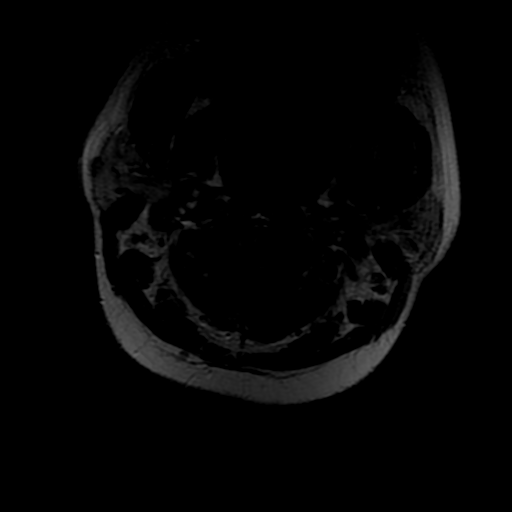

[Series 12: T2 · sagittal · 3.0mm · 0.62mm/px · 2 of 13 slices shown (3 of 4)]
[im 1/13]
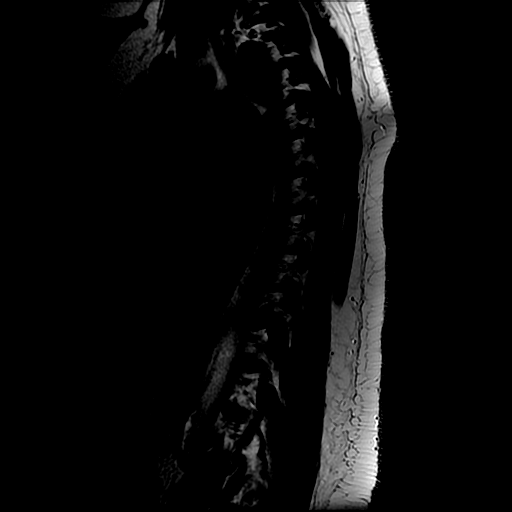
[im 13/13]
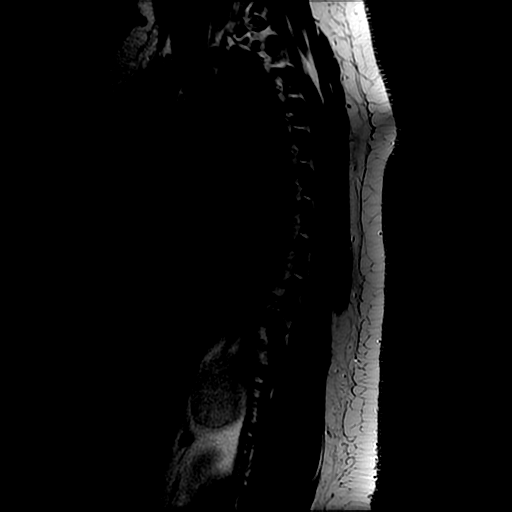

[Series 13: T1 · sagittal · 3.0mm · 0.62mm/px · 2 of 13 slices shown (3 of 3)]
[im 1/13]
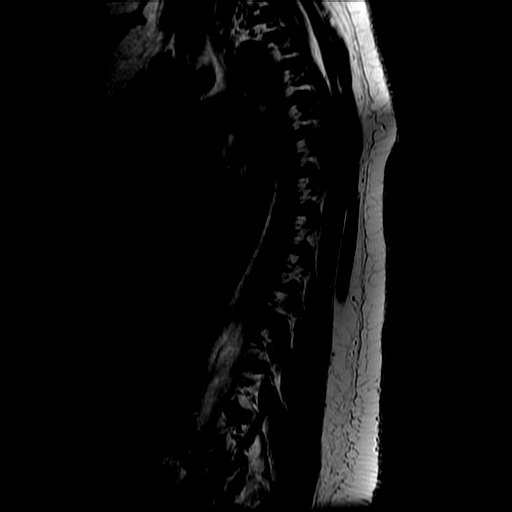
[im 13/13]
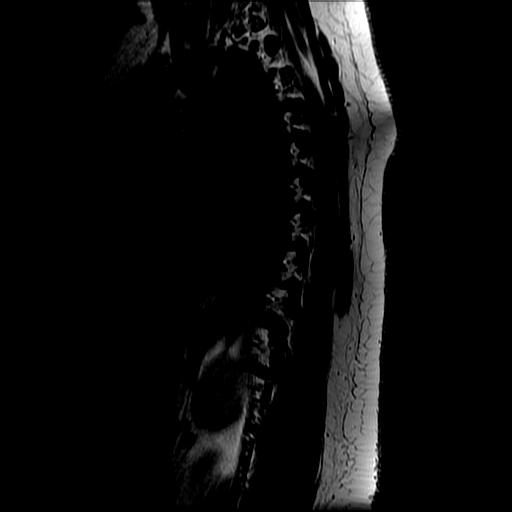

[Series 15: T2 · axial · 4.0mm · 0.43mm/px · z∈[-160,+39]mm · 4 of 33 slices shown (4 of 4)]
[im 1/33]
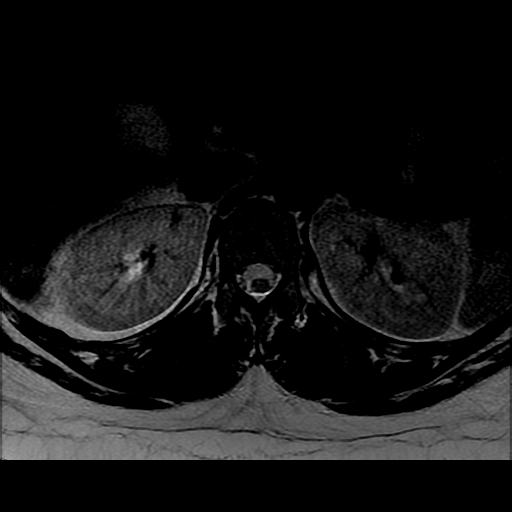
[im 11/33]
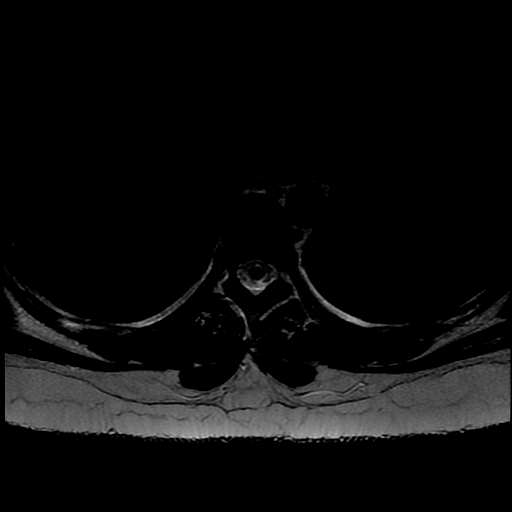
[im 22/33]
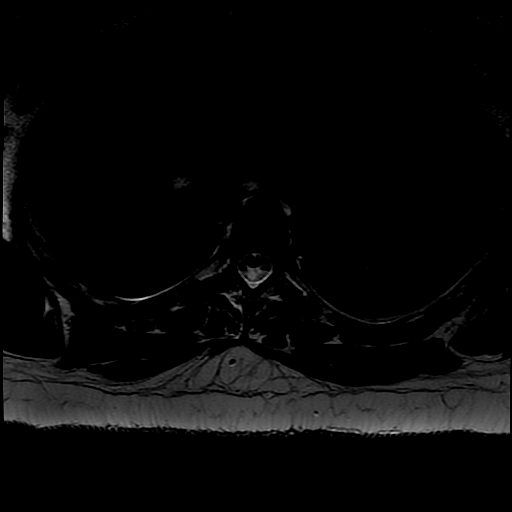
[im 33/33]
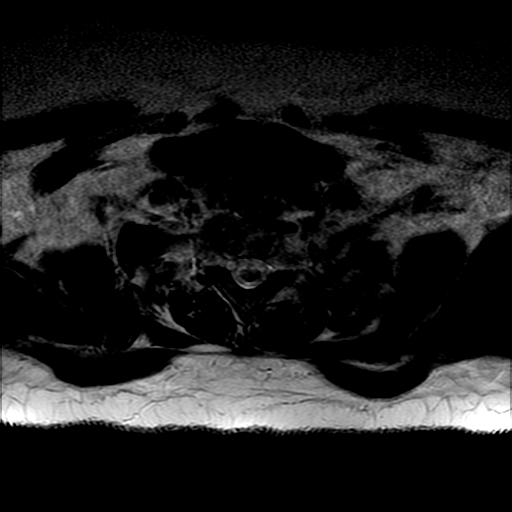

[19 of 48 positions shown; findings below may reference images not displayed]

FINDINGS: MRI CERVICAL SPINE FINDINGS

Alignment is normal. There is no traumatic finding. There is no
inflammatory finding. No degenerative disease. The spinal canal and
foramina are widely patent. Question thyromegaly. This is not well
evaluated using this technique. The cervical spinal cord appears
normal. No abnormal contrast enhancement. Small bit of fatty change
in the marrow of the inferior C7 vertebral body is not significant.

MRI thoracic SPINE FINDINGS

Minimal curvature convex to the left. No fracture. No inflammatory
change. No degenerative changes. Wide patency of the canal and
foramina. No cord lesion. Paravertebral soft tissues appear normal.
IMPRESSION: No acute or significant finding of the cervical or thoracic spine.
No cause of severe back pain identified.

Question thyromegaly. This is not well evaluated, the thyroid gland
only being partially visualized. If there is no concern by physical
exam, this does not necessarily require follow-up.

## 2016-09-08 ENCOUNTER — Encounter: Payer: 59 | Attending: Registered Nurse | Admitting: Registered Nurse

## 2016-09-08 ENCOUNTER — Encounter: Payer: Self-pay | Admitting: Registered Nurse

## 2016-09-08 VITALS — BP 113/75 | HR 87 | Resp 14

## 2016-09-08 DIAGNOSIS — M797 Fibromyalgia: Secondary | ICD-10-CM

## 2016-09-08 DIAGNOSIS — M545 Low back pain: Secondary | ICD-10-CM | POA: Insufficient documentation

## 2016-09-08 DIAGNOSIS — G894 Chronic pain syndrome: Secondary | ICD-10-CM

## 2016-09-08 DIAGNOSIS — M5416 Radiculopathy, lumbar region: Secondary | ICD-10-CM | POA: Diagnosis not present

## 2016-09-08 DIAGNOSIS — M7918 Myalgia, other site: Secondary | ICD-10-CM

## 2016-09-08 DIAGNOSIS — M791 Myalgia: Secondary | ICD-10-CM

## 2016-09-08 MED ORDER — PREGABALIN 75 MG PO CAPS: 75.0000 mg | ORAL_CAPSULE | Freq: Three times a day (TID) | ORAL | 2 refills | Status: DC

## 2016-09-08 NOTE — Progress Notes (Signed)
Subjective:    Patient ID: [name], female    DOB: [birth year], 31 y.o.   MRN: [id number]  HPI: [name] is a 31 year old female who returns for follow up appointment and medication refill. She states her pain is located in her lower back radiating into her right hip. Also states she has tingling and burning in her finger tips. She rates her pain 9. Her current exercise regime is walking. [name] was prescribed Lyrica 50 mg twice a day, it was increased to  TID on 08/29/2016. Her pain is not controlled we will increase Lyrica to 75 mg TID, the above discussed with Dr. Letta Pate and he agrees with plan. We'll titrate up on a monthly basis, she verbalizes understanding.     Pain Inventory Average Pain 9 Pain Right Now 9 My pain is constant, burning and stabbing  In the last 24 hours, has pain interfered with the following? General activity 7 Relation with others 7 Enjoyment of life 7 What TIME of day is your pain at its worst? daytime, evening, night Sleep (in general) Poor  Pain is worse with: walking, bending, sitting and standing Pain improves with: medication Relief from Meds: 8  Mobility walk without assistance ability to climb steps?  yes do you drive?  yes Do you have any goals in this area?  no  Function employed # of hrs/week 40 what is your job? [employer] Do you have any goals in this area?  no  Neuro/Psych numbness tingling spasms depression anxiety  Prior Studies Any changes since last visit?  no  Physicians involved in your care Any changes since last visit?  no   Family History  Problem Relation Age of Onset  . Heart disease Mother   . Diabetes Paternal Aunt    Social History   Social History  . Marital status: Single    Spouse name: N/A  . Number of children: N/A  . Years of education: N/A   Social History Main Topics  . Smoking status: Never Smoker  . Smokeless tobacco: Never Used  . Alcohol use No  . Drug  use: No  . Sexual activity: Not Currently    Birth control/ protection: None, Surgical   Other Topics Concern  . None   Social History Narrative   Lives alone. 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    Past Surgical History:  Procedure Laterality Date  . ABDOMINAL HYSTERECTOMY N/A 11/12/2013   Procedure: Abdominal Hysterectomy with bilateral salpingectomy, and Right oophorectomy;  Surgeon: Janyth Contes, MD;  Location: [facility];  Service: Gynecology;  Laterality: N/A;  . LAPAROSCOPY N/A 12/30/2014   Procedure: LAPAROSCOPY OPERATIVE;  Surgeon: Paula Compton, MD;  Location: [facility];  Service: Gynecology;  Laterality: N/A;  . MYOMECTOMY  12/02/2011   Procedure: MYOMECTOMY;  Surgeon: Gus Height, MD;  Location: [facility];  Service: Gynecology;  Laterality: N/A;  . SCAR REVISION N/A 11/12/2013   Procedure: SCAR REVISION;  Surgeon: Janyth Contes, MD;  Location: [facility];  Service: Gynecology;  Laterality: N/A;   Past Medical History:  Diagnosis Date  . Allergy    seasonal allergies  . Asthma   . Bipolar disorder ([place])   . Depression    ok currently  . Fibroids   . Headache(784.0)    per patient tx for HA  . History of blood transfusion 11/2011   [place]   .  Ovarian cyst   . Pelvic cyst    Seen by gynecologist Dr. Harrington Challenger. Scheduled for ex lap on 12/02/11   BP 113/75 (BP Location: Right Arm, Patient Position: Sitting, Cuff Size: Large)   Pulse 87   Resp 14   LMP 09/29/2013   SpO2 98%   Opioid Risk Score:   Fall Risk Score:  `1  Depression screen PHQ 2/9  Depression screen [facility] 2/9 08/11/2016 10/02/2013 08/30/2013 08/30/2013 02/09/2013  Decreased Interest 1 2 3  0 0  Down, Depressed, Hopeless 2 2 3  0 2  PHQ - 2 Score 3 4 6  0 2  Altered sleeping 1 2 3  - 1  Tired, decreased energy 1 1 3  - 1  Change in appetite 0 2 3 - 3  Feeling bad or failure about yourself  2 1 3  - 3  Trouble concentrating 3 2 2  - 1  Moving  slowly or fidgety/restless 0 1 2 - 2  Suicidal thoughts 0 1 1 - 0  PHQ-9 Score 10 14 23  - 13    Review of Systems  Constitutional: Negative.   HENT: Negative.   Eyes: Negative.   Respiratory: Negative.   Cardiovascular: Negative.   Gastrointestinal: Negative.   Endocrine: Negative.   Genitourinary: Negative.   Musculoskeletal: Positive for back pain.  Skin: Negative.   Allergic/Immunologic: Negative.   Neurological: Positive for numbness.       Tingling  Hematological: Negative.   Psychiatric/Behavioral: Positive for dysphoric mood. The patient is nervous/anxious.   All other systems reviewed and are negative.      Objective:   Physical Exam  Constitutional: She is oriented to person, place, and time. She appears well-developed and well-nourished.  HENT:  Head: Normocephalic and atraumatic.  Neck: Normal range of motion. Neck supple.  Cardiovascular: Normal rate and regular rhythm.   Pulmonary/Chest: Effort normal and breath sounds normal.  Musculoskeletal:  Normal Muscle Bulk and Muscle Testing Reveals: Upper Extremities: Full ROM and Muscle Strength 5/5 Thoracic and Lumbar Hypersensitivity Right Greater Trochanteric Tenderness Lower Extremities: Full ROM and Muscle Strength 5/5 Arises from Table with ease Narrow Based Gait  Neurological: She is alert and oriented to person, place, and time.  Skin: Skin is warm and dry.  Psychiatric: She has a normal mood and affect.  Nursing note and vitals reviewed.         Assessment & Plan:  1. Right Lumbar Radiculitis: Continue Lyrica 2. Fibromyalgia/ Myofascial Pain Syndrome: RX: Increase: Lyrica 75 mg TID.  3. Chronic Pain Syndrome: Continue Lyrica  20 minutes of face to face patient care time was spent during this visits. All question were encouraged and answered.   F/U in 1 month

## 2016-09-15 MED FILL — LYRICA 75 MG CAPSULE: 75 | 30 days supply | Qty: 90 | Fill #0

## 2016-09-27 MED FILL — BUPROPION HCL XL 300 MG TAB: 300 | 30 days supply | Qty: 30 | Fill #0

## 2016-09-27 MED FILL — hydrOXYzine HCL 25 MG TABS: 25 | 30 days supply | Qty: 60 | Fill #0

## 2016-09-27 MED FILL — CITALOPRAM HBR 40 MG TABLET: 40 | 30 days supply | Qty: 30 | Fill #0

## 2016-09-27 MED FILL — PRAZOSIN 5 MG CAPSULE: 5 | 30 days supply | Qty: 30 | Fill #0

## 2016-09-27 MED FILL — HALOPERIDOL 2 MG TABLET: 2 | 30 days supply | Qty: 30 | Fill #0

## 2016-09-27 MED FILL — clonazePAM 0.5 MG TABS: 0.5 | 30 days supply | Qty: 60 | Fill #0

## 2016-10-06 ENCOUNTER — Encounter: Payer: 59 | Attending: Registered Nurse | Admitting: Registered Nurse

## 2016-10-06 DIAGNOSIS — F3181 Bipolar II disorder: Secondary | ICD-10-CM | POA: Diagnosis not present

## 2016-10-06 DIAGNOSIS — M545 Low back pain: Secondary | ICD-10-CM | POA: Insufficient documentation

## 2016-10-06 DIAGNOSIS — F4310 Post-traumatic stress disorder, unspecified: Secondary | ICD-10-CM | POA: Diagnosis not present

## 2016-10-06 DIAGNOSIS — F902 Attention-deficit hyperactivity disorder, combined type: Secondary | ICD-10-CM | POA: Diagnosis not present

## 2016-10-06 DIAGNOSIS — F411 Generalized anxiety disorder: Secondary | ICD-10-CM | POA: Diagnosis not present

## 2016-10-07 MED FILL — DEXTROAMP-AMPHETAMIN 20 MG: 20 | 30 days supply | Qty: 60 | Fill #0

## 2016-10-27 MED FILL — CITALOPRAM HBR 40 MG TABLET: 40 | 30 days supply | Qty: 30 | Fill #1

## 2016-10-27 MED FILL — hydrOXYzine HCL 25 MG TABS: 25 | 30 days supply | Qty: 60 | Fill #1

## 2016-10-27 MED FILL — PRAZOSIN 5 MG CAPSULE: 5 | 30 days supply | Qty: 30 | Fill #1

## 2016-10-27 MED FILL — BUPROPION HCL XL 300 MG TAB: 300 | 30 days supply | Qty: 30 | Fill #1

## 2016-10-27 MED FILL — HALOPERIDOL 2 MG TABLET: 2 | 30 days supply | Qty: 30 | Fill #1

## 2016-10-27 MED FILL — clonazePAM 0.5 MG TABS: 0.5 | 30 days supply | Qty: 60 | Fill #1

## 2016-11-08 DIAGNOSIS — F902 Attention-deficit hyperactivity disorder, combined type: Secondary | ICD-10-CM | POA: Diagnosis not present

## 2016-11-08 DIAGNOSIS — F4310 Post-traumatic stress disorder, unspecified: Secondary | ICD-10-CM | POA: Diagnosis not present

## 2016-11-08 DIAGNOSIS — F411 Generalized anxiety disorder: Secondary | ICD-10-CM | POA: Diagnosis not present

## 2016-11-08 DIAGNOSIS — F3181 Bipolar II disorder: Secondary | ICD-10-CM | POA: Diagnosis not present

## 2016-11-17 MED FILL — DEXTROAMP-AMPHETAMIN 20 MG: 20 | 30 days supply | Qty: 60 | Fill #0

## 2016-11-17 MED FILL — FLUVOXAMINE MALEATE 50 MG T: 50 | 30 days supply | Qty: 60 | Fill #0

## 2016-11-29 MED FILL — HALOPERIDOL 2 MG TABLET: 2 | 30 days supply | Qty: 30 | Fill #0

## 2016-11-29 MED FILL — PRAZOSIN 5 MG CAPSULE: 5 | 30 days supply | Qty: 30 | Fill #0

## 2016-11-29 MED FILL — BUPROPION HCL XL 300 MG TAB: 300 | 30 days supply | Qty: 30 | Fill #0

## 2016-12-01 MED FILL — clonazePAM 0.5 MG TABS: 0.5 | 30 days supply | Qty: 30 | Fill #1

## 2016-12-01 MED FILL — hydrOXYzine HCL 25 MG TABS: 25 | 30 days supply | Qty: 60 | Fill #0

## 2016-12-06 DIAGNOSIS — F4310 Post-traumatic stress disorder, unspecified: Secondary | ICD-10-CM | POA: Diagnosis not present

## 2016-12-06 DIAGNOSIS — F3181 Bipolar II disorder: Secondary | ICD-10-CM | POA: Diagnosis not present

## 2016-12-06 DIAGNOSIS — F902 Attention-deficit hyperactivity disorder, combined type: Secondary | ICD-10-CM | POA: Diagnosis not present

## 2016-12-06 DIAGNOSIS — F411 Generalized anxiety disorder: Secondary | ICD-10-CM | POA: Diagnosis not present

## 2017-01-02 MED FILL — BUPROPION HCL XL 300 MG TAB: 300 | 30 days supply | Qty: 30 | Fill #1

## 2017-01-02 MED FILL — FLUVOXAMINE MALEATE 50 MG T: 50 | 30 days supply | Qty: 60 | Fill #1

## 2017-01-02 MED FILL — hydrOXYzine HCL 25 MG TABS: 25 | 30 days supply | Qty: 60 | Fill #1

## 2017-01-02 MED FILL — PRAZOSIN 5 MG CAPSULE: 5 | 30 days supply | Qty: 30 | Fill #1

## 2017-01-02 MED FILL — HALOPERIDOL 2 MG TABLET: 2 | 30 days supply | Qty: 30 | Fill #1

## 2017-01-13 MED FILL — DEXTROAMP-AMPHETAMIN 20 MG: 20 | 30 days supply | Qty: 60 | Fill #0

## 2017-01-13 MED FILL — clonazePAM 0.5 MG TABS: 0.5 | 30 days supply | Qty: 60 | Fill #0

## 2017-01-31 DIAGNOSIS — F411 Generalized anxiety disorder: Secondary | ICD-10-CM | POA: Diagnosis not present

## 2017-01-31 DIAGNOSIS — F4310 Post-traumatic stress disorder, unspecified: Secondary | ICD-10-CM | POA: Diagnosis not present

## 2017-01-31 DIAGNOSIS — F3181 Bipolar II disorder: Secondary | ICD-10-CM | POA: Diagnosis not present

## 2017-01-31 DIAGNOSIS — F902 Attention-deficit hyperactivity disorder, combined type: Secondary | ICD-10-CM | POA: Diagnosis not present

## 2017-02-06 MED FILL — buPROPion HCL ER (XL) 300 M: 300 | 30 days supply | Qty: 30 | Fill #0

## 2017-02-06 MED FILL — FLUVOXAMINE MALEATE 50 MG T: 50 | 30 days supply | Qty: 30 | Fill #0

## 2017-02-06 MED FILL — HALOPERIDOL 2 MG TABLET: 2 | 30 days supply | Qty: 30 | Fill #0

## 2017-02-06 MED FILL — PRAZOSIN 5 MG CAPSULE: 5 | 30 days supply | Qty: 30 | Fill #0

## 2017-02-06 MED FILL — hydrOXYzine HCL 25 MG TABS: 25 | 30 days supply | Qty: 60 | Fill #0

## 2017-02-21 MED FILL — DEXTROAMP-AMPHETAMIN 20 MG: 20 | 30 days supply | Qty: 60 | Fill #0

## 2017-02-21 MED FILL — clonazePAM 0.5 MG TABS: 0.5 | 30 days supply | Qty: 60 | Fill #0

## 2017-02-25 DIAGNOSIS — B349 Viral infection, unspecified: Secondary | ICD-10-CM | POA: Diagnosis not present

## 2017-02-25 DIAGNOSIS — F419 Anxiety disorder, unspecified: Secondary | ICD-10-CM | POA: Diagnosis not present

## 2017-02-25 DIAGNOSIS — R0989 Other specified symptoms and signs involving the circulatory and respiratory systems: Secondary | ICD-10-CM | POA: Diagnosis not present

## 2017-02-25 DIAGNOSIS — M791 Myalgia: Secondary | ICD-10-CM | POA: Diagnosis not present

## 2017-02-25 DIAGNOSIS — Z79899 Other long term (current) drug therapy: Secondary | ICD-10-CM | POA: Diagnosis not present

## 2017-02-25 DIAGNOSIS — R05 Cough: Secondary | ICD-10-CM | POA: Diagnosis not present

## 2017-02-25 DIAGNOSIS — R509 Fever, unspecified: Secondary | ICD-10-CM | POA: Diagnosis not present

## 2017-02-25 DIAGNOSIS — Z880 Allergy status to penicillin: Secondary | ICD-10-CM | POA: Diagnosis not present

## 2017-02-25 DIAGNOSIS — Z881 Allergy status to other antibiotic agents status: Secondary | ICD-10-CM | POA: Diagnosis not present

## 2017-02-25 DIAGNOSIS — Z885 Allergy status to narcotic agent status: Secondary | ICD-10-CM | POA: Diagnosis not present

## 2017-03-13 MED FILL — BUPROPION HCL XL 300 MG TAB: 300 | 30 days supply | Qty: 30 | Fill #1

## 2017-03-13 MED FILL — hydrOXYzine HCL 25 MG TABS: 25 | 30 days supply | Qty: 60 | Fill #1

## 2017-03-13 MED FILL — HALOPERIDOL 2 MG TABLET: 2 | 30 days supply | Qty: 30 | Fill #1

## 2017-03-13 MED FILL — FLUVOXAMINE MALEATE 50 MG T: 50 | 30 days supply | Qty: 30 | Fill #1

## 2017-03-13 MED FILL — PRAZOSIN 5 MG CAPSULE: 5 | 30 days supply | Qty: 30 | Fill #1

## 2017-03-29 DIAGNOSIS — F4310 Post-traumatic stress disorder, unspecified: Secondary | ICD-10-CM | POA: Diagnosis not present

## 2017-03-29 DIAGNOSIS — F3181 Bipolar II disorder: Secondary | ICD-10-CM | POA: Diagnosis not present

## 2017-03-29 DIAGNOSIS — F902 Attention-deficit hyperactivity disorder, combined type: Secondary | ICD-10-CM | POA: Diagnosis not present

## 2017-03-29 DIAGNOSIS — F411 Generalized anxiety disorder: Secondary | ICD-10-CM | POA: Diagnosis not present

## 2017-03-30 MED FILL — DEXTROAMP-AMPHETAMIN 20 MG: 20 | 30 days supply | Qty: 60 | Fill #0

## 2017-03-30 MED FILL — clonazePAM 0.5 MG TABS: 0.5 | 30 days supply | Qty: 60 | Fill #0

## 2017-04-11 MED FILL — hydrOXYzine HCL 25 MG TABS: 25 | 30 days supply | Qty: 60 | Fill #0

## 2017-04-11 MED FILL — HALOPERIDOL 1 MG TABLET: 1 | 30 days supply | Qty: 60 | Fill #0

## 2017-04-11 MED FILL — PRAZOSIN 5 MG CAPSULE: 5 | 30 days supply | Qty: 30 | Fill #0

## 2017-05-11 MED FILL — FLUVOXAMINE MALEATE 50 MG T: 50 | 30 days supply | Qty: 30 | Fill #0

## 2017-05-11 MED FILL — BUPROPION HCL XL 300 MG TAB: 300 | 30 days supply | Qty: 30 | Fill #0

## 2017-05-11 MED FILL — PRAZOSIN 5 MG CAPSULE: 5 | 30 days supply | Qty: 30 | Fill #1

## 2017-05-11 MED FILL — hydrOXYzine HCL 25 MG TABS: 25 | 30 days supply | Qty: 60 | Fill #1

## 2017-05-11 MED FILL — HALOPERIDOL 1 MG TABLET: 1 | 30 days supply | Qty: 60 | Fill #1

## 2017-05-23 DIAGNOSIS — F411 Generalized anxiety disorder: Secondary | ICD-10-CM | POA: Diagnosis not present

## 2017-05-23 DIAGNOSIS — F902 Attention-deficit hyperactivity disorder, combined type: Secondary | ICD-10-CM | POA: Diagnosis not present

## 2017-05-23 DIAGNOSIS — F4310 Post-traumatic stress disorder, unspecified: Secondary | ICD-10-CM | POA: Diagnosis not present

## 2017-05-23 DIAGNOSIS — F3181 Bipolar II disorder: Secondary | ICD-10-CM | POA: Diagnosis not present

## 2017-05-30 DIAGNOSIS — N83202 Unspecified ovarian cyst, left side: Secondary | ICD-10-CM | POA: Diagnosis not present

## 2017-05-30 DIAGNOSIS — F419 Anxiety disorder, unspecified: Secondary | ICD-10-CM | POA: Diagnosis not present

## 2017-05-30 DIAGNOSIS — N83292 Other ovarian cyst, left side: Secondary | ICD-10-CM | POA: Diagnosis not present

## 2017-05-30 DIAGNOSIS — R112 Nausea with vomiting, unspecified: Secondary | ICD-10-CM | POA: Diagnosis not present

## 2017-05-30 DIAGNOSIS — N83209 Unspecified ovarian cyst, unspecified side: Secondary | ICD-10-CM | POA: Diagnosis not present

## 2017-05-30 DIAGNOSIS — Z791 Long term (current) use of non-steroidal anti-inflammatories (NSAID): Secondary | ICD-10-CM | POA: Diagnosis not present

## 2017-05-30 DIAGNOSIS — R1084 Generalized abdominal pain: Secondary | ICD-10-CM | POA: Diagnosis not present

## 2017-05-30 DIAGNOSIS — R1032 Left lower quadrant pain: Secondary | ICD-10-CM | POA: Diagnosis not present

## 2017-05-30 DIAGNOSIS — F4310 Post-traumatic stress disorder, unspecified: Secondary | ICD-10-CM | POA: Diagnosis not present

## 2017-05-30 DIAGNOSIS — Z885 Allergy status to narcotic agent status: Secondary | ICD-10-CM | POA: Diagnosis not present

## 2017-05-30 DIAGNOSIS — Z888 Allergy status to other drugs, medicaments and biological substances status: Secondary | ICD-10-CM | POA: Diagnosis not present

## 2017-05-30 DIAGNOSIS — Z90721 Acquired absence of ovaries, unilateral: Secondary | ICD-10-CM | POA: Diagnosis not present

## 2017-05-30 DIAGNOSIS — Z880 Allergy status to penicillin: Secondary | ICD-10-CM | POA: Diagnosis not present

## 2017-05-30 DIAGNOSIS — N838 Other noninflammatory disorders of ovary, fallopian tube and broad ligament: Secondary | ICD-10-CM | POA: Diagnosis not present

## 2017-05-30 DIAGNOSIS — Z79899 Other long term (current) drug therapy: Secondary | ICD-10-CM | POA: Diagnosis not present

## 2017-05-30 MED FILL — HYDROCODON-APAP 5-325: 5-325 | 5 days supply | Qty: 20 | Fill #0

## 2017-05-30 MED FILL — ONDANSETRON ODT 4 MG TABLET: 4 | 4 days supply | Qty: 12 | Fill #0

## 2017-06-13 MED FILL — BUPROPION HCL XL 300 MG TAB: 300 | 30 days supply | Qty: 30 | Fill #0

## 2017-06-13 MED FILL — HALOPERIDOL 1 MG TABLET: 1 | 30 days supply | Qty: 60 | Fill #0

## 2017-06-13 MED FILL — hydrOXYzine HCL 25 MG TABS: 25 | 30 days supply | Qty: 60 | Fill #0

## 2017-06-13 MED FILL — clonazePAM 0.5 MG TABS: 0.5 | 30 days supply | Qty: 60 | Fill #0

## 2017-06-13 MED FILL — PRAZOSIN 5 MG CAPSULE: 5 | 30 days supply | Qty: 60 | Fill #0

## 2017-06-13 MED FILL — DEXTROAMP-AMPHETAMIN 20 MG: 20 | 30 days supply | Qty: 60 | Fill #0

## 2017-06-13 MED FILL — FLUVOXAMINE MALEATE 50 MG T: 50 | 30 days supply | Qty: 30 | Fill #0

## 2017-07-13 DIAGNOSIS — R350 Frequency of micturition: Secondary | ICD-10-CM | POA: Diagnosis not present

## 2017-07-13 DIAGNOSIS — N83202 Unspecified ovarian cyst, left side: Secondary | ICD-10-CM | POA: Diagnosis not present

## 2017-07-13 DIAGNOSIS — R52 Pain, unspecified: Secondary | ICD-10-CM | POA: Diagnosis not present

## 2017-07-13 DIAGNOSIS — R102 Pelvic and perineal pain: Secondary | ICD-10-CM | POA: Diagnosis not present

## 2017-07-13 MED FILL — PRAZOSIN 5 MG CAPSULE: 5 | 30 days supply | Qty: 60 | Fill #1

## 2017-07-13 MED FILL — clonazePAM 0.5 MG TABS: 0.5 | 30 days supply | Qty: 60 | Fill #1

## 2017-07-13 MED FILL — HYDROmorphone HCL 2 MG TABS: 2 | 2 days supply | Qty: 10 | Fill #0

## 2017-07-13 MED FILL — BUPROPION HCL XL 300 MG TAB: 300 | 30 days supply | Qty: 30 | Fill #1

## 2017-07-13 MED FILL — HALOPERIDOL 5 MG TABLET: 5 | 30 days supply | Qty: 30 | Fill #0

## 2017-07-13 MED FILL — hydrOXYzine HCL 25 MG TABS: 25 | 30 days supply | Qty: 60 | Fill #1

## 2017-07-13 MED FILL — FLUVOXAMINE MALEATE 50 MG T: 50 | 30 days supply | Qty: 30 | Fill #1

## 2017-07-18 DIAGNOSIS — F411 Generalized anxiety disorder: Secondary | ICD-10-CM | POA: Diagnosis not present

## 2017-07-18 DIAGNOSIS — F3181 Bipolar II disorder: Secondary | ICD-10-CM | POA: Diagnosis not present

## 2017-07-18 DIAGNOSIS — F902 Attention-deficit hyperactivity disorder, combined type: Secondary | ICD-10-CM | POA: Diagnosis not present

## 2017-07-18 DIAGNOSIS — F4310 Post-traumatic stress disorder, unspecified: Secondary | ICD-10-CM | POA: Diagnosis not present

## 2017-07-20 MED FILL — AMPHETAMINE-DEXTRO 20MG: 20 | 30 days supply | Qty: 60 | Fill #0

## 2017-08-17 MED FILL — HYDROmorphone HCL 2 MG TABS: 2 | 2 days supply | Qty: 10 | Fill #0

## 2017-08-18 MED FILL — hydrOXYzine HCL 25 MG TABS: 25 | 30 days supply | Qty: 60 | Fill #0

## 2017-08-18 MED FILL — FLUVOXAMINE MALEATE 50 MG T: 50 | 30 days supply | Qty: 30 | Fill #1

## 2017-08-18 MED FILL — clonazePAM 0.5 MG TABS: 0.5 | 30 days supply | Qty: 60 | Fill #1

## 2017-08-18 MED FILL — AMPHETAMINE-DEXTRO 20MG: 20 | 30 days supply | Qty: 60 | Fill #0

## 2017-08-18 MED FILL — BUPROPION HCL XL 300 MG TAB: 300 | 30 days supply | Qty: 30 | Fill #0

## 2017-08-18 MED FILL — PRAZOSIN 5 MG CAPSULE: 5 | 30 days supply | Qty: 60 | Fill #0

## 2017-08-21 MED FILL — HALOPERIDOL 5 MG TABS: 5 | 30 days supply | Qty: 15 | Fill #0

## 2017-09-07 DIAGNOSIS — H5213 Myopia, bilateral: Secondary | ICD-10-CM | POA: Diagnosis not present

## 2017-09-12 DIAGNOSIS — F411 Generalized anxiety disorder: Secondary | ICD-10-CM | POA: Diagnosis not present

## 2017-09-12 DIAGNOSIS — F3181 Bipolar II disorder: Secondary | ICD-10-CM | POA: Diagnosis not present

## 2017-09-12 DIAGNOSIS — F902 Attention-deficit hyperactivity disorder, combined type: Secondary | ICD-10-CM | POA: Diagnosis not present

## 2017-09-12 DIAGNOSIS — F4310 Post-traumatic stress disorder, unspecified: Secondary | ICD-10-CM | POA: Diagnosis not present

## 2017-09-13 MED FILL — hydrOXYzine HCL 25 MG TABS: 25 | 30 days supply | Qty: 60 | Fill #1

## 2017-09-13 MED FILL — FLUVOXAMINE MALEATE 100 MG: 100 | 30 days supply | Qty: 60 | Fill #0

## 2017-09-13 MED FILL — PRAZOSIN 5 MG CAPSULE: 5 | 30 days supply | Qty: 60 | Fill #1

## 2017-09-14 MED FILL — HALOPERIDOL 5 MG TABS: 5 | 30 days supply | Qty: 15 | Fill #1

## 2017-09-15 MED FILL — DEXTROAMP-AMPHETAMIN 20 MG: 20 | 30 days supply | Qty: 60 | Fill #0

## 2017-09-15 MED FILL — clonazePAM 0.5 MG TABS: 0.5 | 30 days supply | Qty: 60 | Fill #0

## 2017-10-17 MED FILL — DEXTROAMP-AMPHETAMIN 20 MG: 20 | 30 days supply | Qty: 60 | Fill #0

## 2017-10-31 MED FILL — HALOPERIDOL 5 MG TABS: 5 | 30 days supply | Qty: 15 | Fill #0

## 2017-10-31 MED FILL — clonazePAM 0.5 MG TABS: 0.5 | 30 days supply | Qty: 60 | Fill #1

## 2017-10-31 MED FILL — FLUVOXAMINE MALEATE 100 MG: 100 | 30 days supply | Qty: 60 | Fill #1

## 2017-10-31 MED FILL — PRAZOSIN HCL 5 MG CAPS: 5 | 30 days supply | Qty: 60 | Fill #0

## 2017-10-31 MED FILL — hydrOXYzine HCL 25 MG TABS: 25 | 30 days supply | Qty: 60 | Fill #0

## 2017-10-31 MED FILL — BuPROPion HCL ER (XL) 300 M: 300 | 30 days supply | Qty: 30 | Fill #1

## 2017-11-07 DIAGNOSIS — F4310 Post-traumatic stress disorder, unspecified: Secondary | ICD-10-CM | POA: Diagnosis not present

## 2017-11-07 DIAGNOSIS — F902 Attention-deficit hyperactivity disorder, combined type: Secondary | ICD-10-CM | POA: Diagnosis not present

## 2017-11-07 DIAGNOSIS — F3181 Bipolar II disorder: Secondary | ICD-10-CM | POA: Diagnosis not present

## 2017-11-07 DIAGNOSIS — F411 Generalized anxiety disorder: Secondary | ICD-10-CM | POA: Diagnosis not present

## 2017-11-21 MED FILL — DEXTROAMP-AMPHETAMIN 20 MG: 20 | 30 days supply | Qty: 60 | Fill #0

## 2017-12-14 MED FILL — FLUVOXAMINE MALEATE 100 MG: 100 | 30 days supply | Qty: 60 | Fill #0

## 2017-12-14 MED FILL — HALOPERIDOL 5 MG TABS: 5 | 30 days supply | Qty: 15 | Fill #0

## 2017-12-14 MED FILL — hydrOXYzine HCL 25 MG TABS: 25 | 30 days supply | Qty: 60 | Fill #0

## 2017-12-14 MED FILL — PRAZOSIN HCL 5 MG CAPS: 5 | 30 days supply | Qty: 60 | Fill #0

## 2017-12-14 MED FILL — clonazePAM 0.5 MG TABS: 0.5 | 30 days supply | Qty: 60 | Fill #0

## 2017-12-22 MED FILL — DEXTROAMP-AMPHETAMIN 20 MG: 20 | 30 days supply | Qty: 60 | Fill #0

## 2017-12-22 MED FILL — buPROPion HCL ER (XL) 300 M: 300 | 30 days supply | Qty: 30 | Fill #1

## 2018-01-02 DIAGNOSIS — F411 Generalized anxiety disorder: Secondary | ICD-10-CM | POA: Diagnosis not present

## 2018-01-02 DIAGNOSIS — F3181 Bipolar II disorder: Secondary | ICD-10-CM | POA: Diagnosis not present

## 2018-01-02 DIAGNOSIS — F902 Attention-deficit hyperactivity disorder, combined type: Secondary | ICD-10-CM | POA: Diagnosis not present

## 2018-01-02 DIAGNOSIS — F4310 Post-traumatic stress disorder, unspecified: Secondary | ICD-10-CM | POA: Diagnosis not present

## 2018-01-18 MED FILL — hydrOXYzine HCL 25 MG TABS: 25 | 30 days supply | Qty: 60 | Fill #1

## 2018-01-18 MED FILL — clonazePAM 0.5 MG TABS: 0.5 | 30 days supply | Qty: 60 | Fill #1

## 2018-01-18 MED FILL — FLUVOXAMINE MALEATE 100 MG: 100 | 30 days supply | Qty: 90 | Fill #0

## 2018-01-18 MED FILL — PRAZOSIN HCL 5 MG CAPS: 5 | 30 days supply | Qty: 60 | Fill #1

## 2018-01-18 MED FILL — HALOPERIDOL 5 MG TABS: 5 | 30 days supply | Qty: 15 | Fill #1

## 2018-01-18 MED FILL — buPROPion HCL ER (XL) 300 M: 300 | 30 days supply | Qty: 30 | Fill #0

## 2018-02-20 MED FILL — DEXTROAMP-AMPHETAMIN 20 MG: 20 | 30 days supply | Qty: 60 | Fill #0

## 2018-02-27 DIAGNOSIS — F411 Generalized anxiety disorder: Secondary | ICD-10-CM | POA: Diagnosis not present

## 2018-02-27 DIAGNOSIS — F3181 Bipolar II disorder: Secondary | ICD-10-CM | POA: Diagnosis not present

## 2018-02-27 DIAGNOSIS — F902 Attention-deficit hyperactivity disorder, combined type: Secondary | ICD-10-CM | POA: Diagnosis not present

## 2018-02-27 DIAGNOSIS — F4310 Post-traumatic stress disorder, unspecified: Secondary | ICD-10-CM | POA: Diagnosis not present

## 2018-02-27 MED FILL — FLUVOXAMINE MALEATE 100 MG: 100 | 30 days supply | Qty: 90 | Fill #0

## 2018-02-27 MED FILL — hydrOXYzine HCL 25 MG TABS: 25 | 30 days supply | Qty: 60 | Fill #0

## 2018-02-27 MED FILL — HALOPERIDOL 5 MG TABS: 5 | 30 days supply | Qty: 15 | Fill #0

## 2018-02-27 MED FILL — PRAZOSIN HCL 5 MG CAPS: 5 | 30 days supply | Qty: 60 | Fill #0

## 2018-03-01 MED FILL — clonazePAM 0.5 MG TABS: 0.5 | 30 days supply | Qty: 60 | Fill #0

## 2018-03-01 MED FILL — buPROPion HCL ER (XL) 300 M: 300 | 30 days supply | Qty: 30 | Fill #1

## 2018-04-03 MED FILL — DEXTROAMP-AMPHETAMIN 20 MG: 20 | 30 days supply | Qty: 60 | Fill #0

## 2018-04-03 MED FILL — HALOPERIDOL 5 MG TABS: 5 | 30 days supply | Qty: 15 | Fill #1

## 2018-04-03 MED FILL — FLUVOXAMINE MALEATE 100 MG: 100 | 30 days supply | Qty: 90 | Fill #1

## 2018-04-03 MED FILL — clonazePAM 0.5 MG TABS: 0.5 | 30 days supply | Qty: 60 | Fill #0

## 2018-04-03 MED FILL — PRAZOSIN HCL 5 MG CAPS: 5 | 30 days supply | Qty: 60 | Fill #1

## 2018-04-03 MED FILL — hydrOXYzine HCL 25 MG TABS: 25 | 30 days supply | Qty: 60 | Fill #1

## 2018-04-24 DIAGNOSIS — F411 Generalized anxiety disorder: Secondary | ICD-10-CM | POA: Diagnosis not present

## 2018-04-24 DIAGNOSIS — F4310 Post-traumatic stress disorder, unspecified: Secondary | ICD-10-CM | POA: Diagnosis not present

## 2018-04-24 DIAGNOSIS — F3181 Bipolar II disorder: Secondary | ICD-10-CM | POA: Diagnosis not present

## 2018-04-24 DIAGNOSIS — F902 Attention-deficit hyperactivity disorder, combined type: Secondary | ICD-10-CM | POA: Diagnosis not present

## 2018-05-01 MED FILL — hydrOXYzine HCL 25 MG TABS: 25 | 30 days supply | Qty: 60 | Fill #0

## 2018-05-01 MED FILL — clonazePAM 0.5 MG TABS: 0.5 | 30 days supply | Qty: 60 | Fill #0

## 2018-05-01 MED FILL — PRAZOSIN HCL 5 MG CAPS: 5 | 30 days supply | Qty: 60 | Fill #0

## 2018-05-01 MED FILL — FLUVOXAMINE MALEATE 100 MG: 100 | 30 days supply | Qty: 90 | Fill #0

## 2018-05-01 MED FILL — HALOPERIDOL 5 MG TABS: 5 | 30 days supply | Qty: 15 | Fill #0

## 2018-05-01 MED FILL — DEXTROAMP-AMPHETAMIN 20 MG: 20 | 30 days supply | Qty: 60 | Fill #0

## 2018-05-21 DIAGNOSIS — R55 Syncope and collapse: Secondary | ICD-10-CM | POA: Diagnosis not present

## 2018-05-21 DIAGNOSIS — Z885 Allergy status to narcotic agent status: Secondary | ICD-10-CM | POA: Diagnosis not present

## 2018-05-21 DIAGNOSIS — R0789 Other chest pain: Secondary | ICD-10-CM | POA: Diagnosis not present

## 2018-05-21 DIAGNOSIS — N83202 Unspecified ovarian cyst, left side: Secondary | ICD-10-CM | POA: Diagnosis not present

## 2018-05-21 DIAGNOSIS — R590 Localized enlarged lymph nodes: Secondary | ICD-10-CM | POA: Diagnosis not present

## 2018-05-21 DIAGNOSIS — N838 Other noninflammatory disorders of ovary, fallopian tube and broad ligament: Secondary | ICD-10-CM | POA: Diagnosis not present

## 2018-05-21 DIAGNOSIS — Z883 Allergy status to other anti-infective agents status: Secondary | ICD-10-CM | POA: Diagnosis not present

## 2018-05-21 DIAGNOSIS — Z79899 Other long term (current) drug therapy: Secondary | ICD-10-CM | POA: Diagnosis not present

## 2018-05-21 DIAGNOSIS — R51 Headache: Secondary | ICD-10-CM | POA: Diagnosis not present

## 2018-05-21 DIAGNOSIS — F419 Anxiety disorder, unspecified: Secondary | ICD-10-CM | POA: Diagnosis not present

## 2018-05-21 DIAGNOSIS — R109 Unspecified abdominal pain: Secondary | ICD-10-CM | POA: Diagnosis not present

## 2018-05-21 DIAGNOSIS — R079 Chest pain, unspecified: Secondary | ICD-10-CM | POA: Diagnosis not present

## 2018-05-21 DIAGNOSIS — M549 Dorsalgia, unspecified: Secondary | ICD-10-CM | POA: Diagnosis not present

## 2018-05-21 DIAGNOSIS — R1900 Intra-abdominal and pelvic swelling, mass and lump, unspecified site: Secondary | ICD-10-CM | POA: Diagnosis not present

## 2018-05-21 DIAGNOSIS — Z880 Allergy status to penicillin: Secondary | ICD-10-CM | POA: Diagnosis not present

## 2018-05-26 ENCOUNTER — Ambulatory Visit (INDEPENDENT_AMBULATORY_CARE_PROVIDER_SITE_OTHER): Payer: 59

## 2018-05-26 ENCOUNTER — Ambulatory Visit (INDEPENDENT_AMBULATORY_CARE_PROVIDER_SITE_OTHER): Payer: 59 | Admitting: Podiatry

## 2018-05-26 ENCOUNTER — Other Ambulatory Visit: Payer: Self-pay | Admitting: Podiatry

## 2018-05-26 ENCOUNTER — Encounter: Payer: Self-pay | Admitting: Podiatry

## 2018-05-26 DIAGNOSIS — S99921A Unspecified injury of right foot, initial encounter: Secondary | ICD-10-CM

## 2018-05-26 DIAGNOSIS — S93602A Unspecified sprain of left foot, initial encounter: Secondary | ICD-10-CM

## 2018-05-26 DIAGNOSIS — S99922A Unspecified injury of left foot, initial encounter: Secondary | ICD-10-CM | POA: Diagnosis not present

## 2018-05-26 MED ORDER — MELOXICAM 15 MG PO TABS: 15.0000 mg | ORAL_TABLET | Freq: Every day | ORAL | 0 refills | Status: DC

## 2018-05-26 MED ORDER — HYDROCODONE-ACETAMINOPHEN 5-325 MG PO TABS: 1.0000 | ORAL_TABLET | Freq: Four times a day (QID) | ORAL | 0 refills | Status: DC | PRN

## 2018-05-29 NOTE — Progress Notes (Addendum)
   HPI: 32 year old female presenting today with a chief complaint of constant sharp and throbbing pain to the left foot that began two days ago after an injury. She states she fell and twisted the foot, causing her to feel and hear a popping sensation. She reports associated swelling. Touching and weightbearing increases the pain. She has been elevating, icing and taking Ibuprofen for treatment with no significant relief. Patient is here for further evaluation and treatment.   Past Medical History:  Diagnosis Date  . Allergy    seasonal allergies  . Asthma   . Bipolar disorder ([place])   . Depression    ok currently  . Fibroids   . Headache(784.0)    per patient tx for HA  . History of blood transfusion 11/2011   [place]   . Ovarian cyst   . Pelvic cyst    Seen by gynecologist Dr. Harrington Challenger. Scheduled for ex lap on 12/02/11     Physical Exam: General: The patient is alert and oriented x3 in no acute distress.  Dermatology: Skin is warm, dry and supple bilateral lower extremities. Negative for open lesions or macerations.  Vascular: Palpable pedal pulses bilaterally. Capillary refill within normal limits.  Neurological: Epicritic and protective threshold grossly intact bilaterally.   Musculoskeletal Exam: Ecchymosis and edema with pain on palpation to the left foot. Range of motion within normal limits to all pedal and ankle joints bilateral. Muscle strength 5/5 in all groups bilateral.   Radiographic Exam:  Normal osseous mineralization. Joint spaces preserved. No fracture/dislocation/boney destruction.    Assessment: 1. Foot sprain left   Plan of Care:  1. Patient evaluated. X-Rays reviewed.  2. Prescription for Vicodin 5/325 mg provided to patient.  3. Prescription for Meloxicam provided to patient. 4. CAM boot dispensed.  5. Ace wrap dispensed.  6. Recommended conservative treatment with RICE modalities.  7. Return to clinic in 4 weeks.      Edrick Kins, DPM [facility]  Dr. Edrick Kins, DPM    [address], [place] [address]                Office [phone]  Fax [phone]

## 2018-06-05 MED FILL — DEXTROAMP-AMPHETAMIN 20 MG: 20 | 30 days supply | Qty: 60 | Fill #0

## 2018-06-05 MED FILL — HALOPERIDOL 5 MG TABS: 5 | 30 days supply | Qty: 15 | Fill #1

## 2018-06-05 MED FILL — hydrOXYzine HCL 25 MG TABS: 25 | 30 days supply | Qty: 60 | Fill #1

## 2018-06-05 MED FILL — FLUVOXAMINE MALEATE 100 MG: 100 | 30 days supply | Qty: 90 | Fill #1

## 2018-06-05 MED FILL — clonazePAM 0.5 MG TABS: 0.5 | 30 days supply | Qty: 60 | Fill #1

## 2018-06-05 MED FILL — PRAZOSIN HCL 5 MG CAPS: 5 | 30 days supply | Qty: 60 | Fill #1

## 2018-06-19 DIAGNOSIS — F902 Attention-deficit hyperactivity disorder, combined type: Secondary | ICD-10-CM | POA: Diagnosis not present

## 2018-06-19 DIAGNOSIS — F411 Generalized anxiety disorder: Secondary | ICD-10-CM | POA: Diagnosis not present

## 2018-06-19 DIAGNOSIS — F4310 Post-traumatic stress disorder, unspecified: Secondary | ICD-10-CM | POA: Diagnosis not present

## 2018-06-19 DIAGNOSIS — F3181 Bipolar II disorder: Secondary | ICD-10-CM | POA: Diagnosis not present

## 2018-06-19 MED FILL — buPROPion HCL ER (XL) 300 M: 300 | 30 days supply | Qty: 30 | Fill #0

## 2018-06-25 ENCOUNTER — Telehealth: Payer: Self-pay | Admitting: *Deleted

## 2018-06-25 ENCOUNTER — Encounter: Payer: Self-pay | Admitting: Podiatry

## 2018-06-25 ENCOUNTER — Ambulatory Visit: Payer: 59 | Admitting: Podiatry

## 2018-06-25 DIAGNOSIS — S93402D Sprain of unspecified ligament of left ankle, subsequent encounter: Secondary | ICD-10-CM

## 2018-06-25 DIAGNOSIS — S99922D Unspecified injury of left foot, subsequent encounter: Secondary | ICD-10-CM

## 2018-06-25 DIAGNOSIS — S93602A Unspecified sprain of left foot, initial encounter: Secondary | ICD-10-CM

## 2018-06-25 DIAGNOSIS — S99922A Unspecified injury of left foot, initial encounter: Secondary | ICD-10-CM

## 2018-06-25 MED ORDER — HYDROCODONE-ACETAMINOPHEN 5-325 MG PO TABS: 1.0000 | ORAL_TABLET | Freq: Four times a day (QID) | ORAL | 0 refills | Status: DC | PRN

## 2018-06-25 MED ORDER — MELOXICAM 15 MG PO TABS: 15.0000 mg | ORAL_TABLET | Freq: Every day | ORAL | 0 refills | Status: AC

## 2018-06-25 MED FILL — MELOXICAM 15 MG TABLET: 15 | 60 days supply | Qty: 60 | Fill #0

## 2018-06-25 MED FILL — HYDROCODON-APAP 5-325: 5-325 | 7 days supply | Qty: 30 | Fill #0

## 2018-06-25 NOTE — Telephone Encounter (Signed)
-----   Message from Edrick Kins, DPM sent at 06/25/2018  8:39 AM EST ----- Regarding: MRI left ankle Please order MRI left ankle without contrast.  Diagnosis: Severe foot and ankle sprain left  Encounter note this morning is completed. :)  Thanks, Dr. Amalia Hailey

## 2018-06-25 NOTE — Telephone Encounter (Signed)
I called [facility] and gave Passcode:  [id number], Ronalee Belts states NO PRE-CERT IS NEEDED REFERENCE # [id number].

## 2018-06-25 NOTE — Telephone Encounter (Signed)
[facility] to fax benefits.

## 2018-06-25 NOTE — Progress Notes (Signed)
   HPI: Patient presents today for week follow-up for severe foot and ankle sprain to the left foot.  Date of injury 05/24/2018.  At the time of evaluation on 05/26/2018 conservative modalities were initiated including immobilization in a cam boot, anti-inflammatory medication, and conservative RICE pneumonic.  Patient states that there is been minimal improvement over the last 4 weeks and she still has a significant amount of pain.  She presents for follow-up treatment and evaluation  Past Medical History:  Diagnosis Date  . Allergy    seasonal allergies  . Asthma   . Bipolar disorder ([place])   . Depression    ok currently  . Fibroids   . Headache(784.0)    per patient tx for HA  . History of blood transfusion 11/2011   [place]   . Ovarian cyst   . Pelvic cyst    Seen by gynecologist Dr. Harrington Challenger. Scheduled for ex lap on 12/02/11     Physical Exam: General: The patient is alert and oriented x3 in no acute distress.  Dermatology: Skin is warm, dry and supple bilateral lower extremities. Negative for open lesions or macerations.  Vascular: Palpable pedal pulses bilaterally. No edema or erythema noted. Capillary refill within normal limits.  Neurological: Epicritic and protective threshold grossly intact bilaterally.   Musculoskeletal Exam: Significant pain on palpation to the anterior lateral aspect of the ankle joint as well as the lateral aspect of the midfoot.  Ecchymosis and edema does appear to be improved over the past 4 weeks.  Range of motion within normal limits to all pedal and ankle joints bilateral. Muscle strength 5/5 in all groups bilateral.   Assessment: 1.  Left foot and ankle sprain-unimproved   Plan of Care:  1. Patient evaluated.   2.  Today we are going to order an MRI since there is been no improvement with conservative modalities or treatment.  MRI without contrast left ankle 3.  Return to clinic in 3 weeks to review MRI results 4.  In the meantime continue  immobilization in a cam boot with compression. 5.  Compression anklet dispensed wear daily 6.  Refill prescription for meloxicam 15 mg 7.  Refill prescription for Vicodin 5/325 mg      Edrick Kins, DPM [facility]  Dr. Edrick Kins, DPM    [address], [place] [address]                Office [phone]  Fax [phone]

## 2018-06-25 NOTE — Telephone Encounter (Signed)
Faxed orders to [facility]. 

## 2018-07-02 ENCOUNTER — Ambulatory Visit (HOSPITAL_COMMUNITY)
Admission: RE | Admit: 2018-07-02 | Discharge: 2018-07-02 | Disposition: A | Discharge status: Home / Self Care | Payer: 59 | Source: Ambulatory Visit | Attending: Podiatry | Admitting: Podiatry

## 2018-07-02 DIAGNOSIS — S93602A Unspecified sprain of left foot, initial encounter: Secondary | ICD-10-CM | POA: Insufficient documentation

## 2018-07-02 DIAGNOSIS — R600 Localized edema: Secondary | ICD-10-CM | POA: Diagnosis not present

## 2018-07-02 DIAGNOSIS — S99922A Unspecified injury of left foot, initial encounter: Secondary | ICD-10-CM | POA: Diagnosis not present

## 2018-07-02 DIAGNOSIS — S93402D Sprain of unspecified ligament of left ankle, subsequent encounter: Secondary | ICD-10-CM | POA: Diagnosis not present

## 2018-07-02 DIAGNOSIS — S99922D Unspecified injury of left foot, subsequent encounter: Secondary | ICD-10-CM | POA: Diagnosis not present

## 2018-07-09 ENCOUNTER — Encounter: Payer: Self-pay | Admitting: Podiatry

## 2018-07-09 MED FILL — PRAZOSIN HCL 5 MG CAPS: 5 | 30 days supply | Qty: 60 | Fill #0

## 2018-07-09 MED FILL — clonazePAM 0.5 MG TABS: 0.5 | 30 days supply | Qty: 60 | Fill #0

## 2018-07-09 MED FILL — hydrOXYzine HCL 25 MG TABS: 25 | 30 days supply | Qty: 60 | Fill #0

## 2018-07-09 MED FILL — HALOPERIDOL 5 MG TABS: 5 | 30 days supply | Qty: 15 | Fill #0

## 2018-07-09 MED FILL — DEXTROAMP-AMPHETAMIN 20 MG: 20 | 30 days supply | Qty: 60 | Fill #0

## 2018-07-23 ENCOUNTER — Ambulatory Visit: Payer: 59 | Admitting: Podiatry

## 2018-07-26 ENCOUNTER — Encounter: Payer: Self-pay | Admitting: Podiatry

## 2018-08-01 ENCOUNTER — Ambulatory Visit: Payer: 59 | Admitting: Podiatry

## 2018-08-01 DIAGNOSIS — M659 Synovitis and tenosynovitis, unspecified: Secondary | ICD-10-CM | POA: Diagnosis not present

## 2018-08-01 DIAGNOSIS — S93402D Sprain of unspecified ligament of left ankle, subsequent encounter: Secondary | ICD-10-CM

## 2018-08-01 DIAGNOSIS — S99922D Unspecified injury of left foot, subsequent encounter: Secondary | ICD-10-CM

## 2018-08-01 MED ORDER — HYDROCODONE-ACETAMINOPHEN 5-325 MG PO TABS: 1.0000 | ORAL_TABLET | Freq: Three times a day (TID) | ORAL | 0 refills | Status: DC

## 2018-08-01 MED ORDER — METHYLPREDNISOLONE 4 MG PO TBPK: ORAL_TABLET | ORAL | 0 refills | Status: DC

## 2018-08-01 MED FILL — HYDROCODON-APAP 5-325: 5-325 | 10 days supply | Qty: 30 | Fill #0

## 2018-08-01 MED FILL — METHYLPREDNISOLONE 4 MG TBP: 4 | 6 days supply | Qty: 21 | Fill #0

## 2018-08-06 MED FILL — hydrOXYzine HCL 25 MG TABS: 25 | 30 days supply | Qty: 60 | Fill #1

## 2018-08-06 MED FILL — buPROPion HCL ER (XL) 300 M: 300 | 30 days supply | Qty: 30 | Fill #1

## 2018-08-06 MED FILL — HALOPERIDOL 5 MG TABS: 5 | 30 days supply | Qty: 15 | Fill #1

## 2018-08-06 MED FILL — FLUVOXAMINE MALEATE 100 MG: 100 | 30 days supply | Qty: 90 | Fill #0

## 2018-08-06 MED FILL — PRAZOSIN HCL 5 MG CAPS: 5 | 30 days supply | Qty: 60 | Fill #1

## 2018-08-06 MED FILL — clonazePAM 0.5 MG TABS: 0.5 | 30 days supply | Qty: 60 | Fill #1

## 2018-08-07 MED FILL — DEXTROAMP-AMPHETAMIN 20 MG: 20 | 30 days supply | Qty: 60 | Fill #0

## 2018-08-08 NOTE — Progress Notes (Signed)
   Subjective:  33 year old female presenting today for follow up evaluation of left foot pain. She states her pain has not improved much. She has been taking Vicodin and Meloxicam for pain. Being on the foot for long periods of time increases the pain. Patient is here for further evaluation and treatment.   Past Medical History:  Diagnosis Date  . Allergy    seasonal allergies  . Asthma   . Bipolar disorder ([place])   . Depression    ok currently  . Fibroids   . Headache(784.0)    per patient tx for HA  . History of blood transfusion 11/2011   [place]   . Ovarian cyst   . Pelvic cyst    Seen by gynecologist Dr. Harrington Challenger. Scheduled for ex lap on 12/02/11     Objective / Physical Exam:  General:  The patient is alert and oriented x3 in no acute distress. Dermatology:  Skin is warm, dry and supple bilateral lower extremities. Negative for open lesions or macerations. Vascular:  Palpable pedal pulses bilaterally. No edema or erythema noted. Capillary refill within normal limits. Neurological:  Epicritic and protective threshold grossly intact bilaterally.  Musculoskeletal Exam:  Pain on palpation to the anterior lateral medial aspects of the patient's left ankle. Mild edema noted. Tenderness to palpation of the left midfoot. Range of motion within normal limits to all pedal and ankle joints bilateral. Muscle strength 5/5 in all groups bilateral.   Assessment: 1. Left foot capsulitis/bone marrow edema 2. Left ankle synovitis   Plan of Care:  1. Patient was evaluated. 2. injection of 0.5 mL Celestone Soluspan injected in the patient's left ankle. 3. Prescription for Medrol Dose Pak provided to patient. Then resume taking Meloxicam.  4. Continue weightbearing in CAM boot for four weeks.  5. Refill prescription for Vicodin 5/325 mg provided to patient.  6. Return to clinic in 6 weeks.    Edrick Kins, DPM [facility]  Dr. Edrick Kins, [place]                                         [place], [place] [address]                Office [phone]  Fax [phone]

## 2018-08-13 DIAGNOSIS — F4310 Post-traumatic stress disorder, unspecified: Secondary | ICD-10-CM | POA: Diagnosis not present

## 2018-08-13 DIAGNOSIS — F411 Generalized anxiety disorder: Secondary | ICD-10-CM | POA: Diagnosis not present

## 2018-08-13 DIAGNOSIS — F3181 Bipolar II disorder: Secondary | ICD-10-CM | POA: Diagnosis not present

## 2018-08-13 DIAGNOSIS — F902 Attention-deficit hyperactivity disorder, combined type: Secondary | ICD-10-CM | POA: Diagnosis not present

## 2018-08-13 MED FILL — busPIRone HCL 15 MG TABS: 15 | 30 days supply | Qty: 60 | Fill #0

## 2018-09-04 ENCOUNTER — Encounter: Payer: Self-pay | Admitting: Podiatry

## 2018-09-06 NOTE — Telephone Encounter (Signed)
Unable to leave a message voicemail not available.

## 2018-09-07 MED FILL — FLUVOXAMINE MALEATE 100 MG: 100 | 30 days supply | Qty: 90 | Fill #1

## 2018-09-07 MED FILL — AMPHETAMINE-DEXTROAMPHETAMI: 20 | 15 days supply | Qty: 30 | Fill #0

## 2018-09-07 MED FILL — PRAZOSIN HCL 5 MG CAPS: 5 | 30 days supply | Qty: 60 | Fill #0

## 2018-09-07 MED FILL — hydrOXYzine HCL 25 MG TABS: 25 | 30 days supply | Qty: 60 | Fill #0

## 2018-09-11 DIAGNOSIS — F4310 Post-traumatic stress disorder, unspecified: Secondary | ICD-10-CM | POA: Diagnosis not present

## 2018-09-11 DIAGNOSIS — F411 Generalized anxiety disorder: Secondary | ICD-10-CM | POA: Diagnosis not present

## 2018-09-11 DIAGNOSIS — F902 Attention-deficit hyperactivity disorder, combined type: Secondary | ICD-10-CM | POA: Diagnosis not present

## 2018-09-11 DIAGNOSIS — F3181 Bipolar II disorder: Secondary | ICD-10-CM | POA: Diagnosis not present

## 2018-09-11 MED FILL — busPIRone HCL 15 MG TABS: 15 | 30 days supply | Qty: 60 | Fill #0

## 2018-09-12 ENCOUNTER — Ambulatory Visit: Payer: 59 | Admitting: Podiatry

## 2018-09-12 ENCOUNTER — Other Ambulatory Visit: Payer: Self-pay

## 2018-09-12 DIAGNOSIS — R937 Abnormal findings on diagnostic imaging of other parts of musculoskeletal system: Secondary | ICD-10-CM

## 2018-09-12 DIAGNOSIS — N951 Menopausal and female climacteric states: Secondary | ICD-10-CM | POA: Insufficient documentation

## 2018-09-12 DIAGNOSIS — B373 Candidiasis of vulva and vagina: Secondary | ICD-10-CM | POA: Insufficient documentation

## 2018-09-12 DIAGNOSIS — N760 Acute vaginitis: Secondary | ICD-10-CM

## 2018-09-12 DIAGNOSIS — M779 Enthesopathy, unspecified: Secondary | ICD-10-CM | POA: Diagnosis not present

## 2018-09-12 DIAGNOSIS — S93602D Unspecified sprain of left foot, subsequent encounter: Secondary | ICD-10-CM

## 2018-09-12 DIAGNOSIS — S99922D Unspecified injury of left foot, subsequent encounter: Secondary | ICD-10-CM

## 2018-09-12 DIAGNOSIS — M778 Other enthesopathies, not elsewhere classified: Secondary | ICD-10-CM

## 2018-09-12 DIAGNOSIS — R1031 Right lower quadrant pain: Secondary | ICD-10-CM | POA: Insufficient documentation

## 2018-09-12 DIAGNOSIS — D7589 Other specified diseases of blood and blood-forming organs: Secondary | ICD-10-CM

## 2018-09-12 DIAGNOSIS — B9689 Other specified bacterial agents as the cause of diseases classified elsewhere: Secondary | ICD-10-CM | POA: Insufficient documentation

## 2018-09-12 DIAGNOSIS — N898 Other specified noninflammatory disorders of vagina: Secondary | ICD-10-CM | POA: Insufficient documentation

## 2018-09-12 DIAGNOSIS — B3731 Acute candidiasis of vulva and vagina: Secondary | ICD-10-CM | POA: Insufficient documentation

## 2018-09-12 MED FILL — HALOPERIDOL 5 MG TABS: 5 | 30 days supply | Qty: 15 | Fill #0

## 2018-09-16 NOTE — Progress Notes (Signed)
   Subjective:  33 year old female presenting today for follow up evaluation of left foot pain. She states her pain is relatively unchanged. She reports trying to transition into a regular shoe but this caused swelling and increased pain so she continued using the CAM boot. Using the boot and taking the Meloxicam have helped decrease the pain mildly and temporarily. Patient is here for further evaluation and treatment.   Past Medical History:  Diagnosis Date  . Allergy    seasonal allergies  . Asthma   . Bipolar disorder ([place])   . Depression    ok currently  . Fibroids   . Headache(784.0)    per patient tx for HA  . History of blood transfusion 11/2011   [place]   . Ovarian cyst   . Pelvic cyst    Seen by gynecologist Dr. Harrington Challenger. Scheduled for ex lap on 12/02/11     Objective / Physical Exam:  General:  The patient is alert and oriented x3 in no acute distress. Dermatology:  Skin is warm, dry and supple bilateral lower extremities. Negative for open lesions or macerations. Vascular:  Palpable pedal pulses bilaterally. No edema or erythema noted. Capillary refill within normal limits. Neurological:  Epicritic and protective threshold grossly intact bilaterally.  Musculoskeletal Exam:  Negative for any significant pain on palpation to the anterior lateral medial aspects of the patient's left ankle. Mild edema noted. Tenderness to palpation of the left midfoot. Range of motion within normal limits to all pedal and ankle joints bilateral. Muscle strength 5/5 in all groups bilateral.  MRI Impression:   Marked marrow edema in the proximal 5 cm of the fifth metatarsal is most consistent with stress change. No displaced fracture is identified.  Small foci mild subchondral edema in the cuboid at the fourth and fifth tarsometatarsal joints could be due to stress or degenerative change.  Negative for tendon or ligament tear.  Assessment: 1. Left foot capsulitis/bone marrow edema - improved   2. Left ankle synovitis - resolved   Plan of Care:  1. Patient was evaluated. 2. injection of 0.5 mL Celestone Soluspan injected in the left mid tarsal joints. 3. Continue taking Meloxicam.  4. Slowly transition out of CAM boot into good shoe gear.  5. Return to clinic in 4 weeks.    Edrick Kins, DPM [facility]  Dr. Edrick Kins, [place]                                        [place], [place] [address]                Office [phone]  Fax [phone]

## 2018-09-19 MED FILL — AMPHETAMINE-DEXTROAMPHETAMI: 20 | 30 days supply | Qty: 60 | Fill #0

## 2018-10-10 MED FILL — HALOPERIDOL 5 MG TABS: 5 | 30 days supply | Qty: 15 | Fill #1

## 2018-10-10 MED FILL — hydrOXYzine HCL 25 MG TABS: 25 | 30 days supply | Qty: 60 | Fill #1

## 2018-10-10 MED FILL — PRAZOSIN HCL 5 MG CAPS: 5 | 30 days supply | Qty: 60 | Fill #1

## 2018-10-10 MED FILL — FLUVOXAMINE MALEATE 100 MG: 100 | 30 days supply | Qty: 90 | Fill #0

## 2018-10-11 DIAGNOSIS — F902 Attention-deficit hyperactivity disorder, combined type: Secondary | ICD-10-CM | POA: Diagnosis not present

## 2018-10-11 DIAGNOSIS — F3181 Bipolar II disorder: Secondary | ICD-10-CM | POA: Diagnosis not present

## 2018-10-11 DIAGNOSIS — F411 Generalized anxiety disorder: Secondary | ICD-10-CM | POA: Diagnosis not present

## 2018-10-11 DIAGNOSIS — F4310 Post-traumatic stress disorder, unspecified: Secondary | ICD-10-CM | POA: Diagnosis not present

## 2018-10-11 MED FILL — busPIRone HCL 15 MG TABS: 15 | 30 days supply | Qty: 60 | Fill #0

## 2018-10-15 ENCOUNTER — Ambulatory Visit: Payer: 59 | Admitting: Podiatry

## 2018-10-15 ENCOUNTER — Encounter: Payer: Self-pay | Admitting: Podiatry

## 2018-10-15 ENCOUNTER — Ambulatory Visit (INDEPENDENT_AMBULATORY_CARE_PROVIDER_SITE_OTHER): Payer: 59

## 2018-10-15 ENCOUNTER — Other Ambulatory Visit: Payer: Self-pay

## 2018-10-15 VITALS — Temp 97.7°F

## 2018-10-15 DIAGNOSIS — S92502A Displaced unspecified fracture of left lesser toe(s), initial encounter for closed fracture: Secondary | ICD-10-CM | POA: Diagnosis not present

## 2018-10-15 DIAGNOSIS — S90122A Contusion of left lesser toe(s) without damage to nail, initial encounter: Secondary | ICD-10-CM

## 2018-10-15 NOTE — Progress Notes (Signed)
   HPI: 33 year old otherwise healthy female presents today for follow-up evaluation regarding left foot capsulitis with bone marrow edema.  Patient states that pain is gone.  She is no longer symptomatic. Patient is a new complaint of an injury that occurred on 10/13/2018.  Patient was running through her house when she hit her toe.  She says that she heard something crack and has exquisite pain with bruising to the toe.  She presents for further treatment evaluation  Past Medical History:  Diagnosis Date  . Allergy    seasonal allergies  . Asthma   . Bipolar disorder ([place])   . Depression    ok currently  . Fibroids   . Headache(784.0)    per patient tx for HA  . History of blood transfusion 11/2011   [place]   . Ovarian cyst   . Pelvic cyst    Seen by gynecologist Dr. Harrington Challenger. Scheduled for ex lap on 12/02/11     Physical Exam: General: The patient is alert and oriented x3 in no acute distress.  Dermatology: Skin is warm, dry and supple bilateral lower extremities. Negative for open lesions or macerations.  Vascular: Palpable pedal pulses bilaterally. No edema or erythema noted. Capillary refill within normal limits.  Neurological: Epicritic and protective threshold grossly intact bilaterally.   Musculoskeletal Exam: Range of motion within normal limits to all pedal and ankle joints bilateral. Muscle strength 5/5 in all groups bilateral.   Radiographic Exam:  Normal osseous mineralization. Joint spaces preserved.  Oblique fracture noted to the metaphyseal base of the proximal phalanx fourth digit left foot.  Minimal displacement noted.  Assessment: 1.  Fracture fourth toe left foot   Plan of Care:  1. Patient evaluated. X-Rays reviewed.  2.  Postoperative shoe dispensed.  Weightbearing as tolerated. 3.  Recommend OTC Tylenol or ibuprofen as needed 4.  I explained that toe fractures can take months to resolve.   5.  Return to clinic in 6 weeks for follow-up x-rays      Edrick Kins, DPM [facility]  Dr. Edrick Kins, DPM    [address], [place] [address]                Office [phone]  Fax [phone]

## 2018-10-17 MED FILL — AMPHETAMINE-DEXTROAMPHETAMI: 20 | 30 days supply | Qty: 60 | Fill #0

## 2018-11-06 DIAGNOSIS — F3181 Bipolar II disorder: Secondary | ICD-10-CM | POA: Diagnosis not present

## 2018-11-06 DIAGNOSIS — F411 Generalized anxiety disorder: Secondary | ICD-10-CM | POA: Diagnosis not present

## 2018-11-06 DIAGNOSIS — F4310 Post-traumatic stress disorder, unspecified: Secondary | ICD-10-CM | POA: Diagnosis not present

## 2018-11-06 DIAGNOSIS — F902 Attention-deficit hyperactivity disorder, combined type: Secondary | ICD-10-CM | POA: Diagnosis not present

## 2018-11-06 MED FILL — busPIRone HCL 15 MG TABS: 15 | 30 days supply | Qty: 90 | Fill #0

## 2018-11-06 MED FILL — PRAZOSIN HCL 5 MG CAPS: 5 | 30 days supply | Qty: 60 | Fill #0

## 2018-11-06 MED FILL — hydrOXYzine HCL 25 MG TABS: 25 | 30 days supply | Qty: 60 | Fill #0

## 2018-11-13 MED FILL — AMPHETAMINE-DEXTROAMPHETAMI: 20 | 30 days supply | Qty: 30 | Fill #0

## 2018-11-13 MED FILL — ADDERALL XR 20 MG CAP SA: 20 | 30 days supply | Qty: 30 | Fill #0

## 2018-11-13 MED FILL — FLUVOXAMINE MALEATE 100 MG: 100 | 30 days supply | Qty: 90 | Fill #1

## 2018-11-28 ENCOUNTER — Ambulatory Visit: Payer: 59 | Admitting: Podiatry

## 2018-12-04 DIAGNOSIS — F411 Generalized anxiety disorder: Secondary | ICD-10-CM | POA: Diagnosis not present

## 2018-12-04 DIAGNOSIS — F4310 Post-traumatic stress disorder, unspecified: Secondary | ICD-10-CM | POA: Diagnosis not present

## 2018-12-04 DIAGNOSIS — F3181 Bipolar II disorder: Secondary | ICD-10-CM | POA: Diagnosis not present

## 2018-12-04 DIAGNOSIS — F902 Attention-deficit hyperactivity disorder, combined type: Secondary | ICD-10-CM | POA: Diagnosis not present

## 2018-12-04 MED FILL — PRAZOSIN HCL 5 MG CAPS: 5 | 30 days supply | Qty: 60 | Fill #0

## 2018-12-04 MED FILL — hydrOXYzine HCL 25 MG TABS: 25 | 30 days supply | Qty: 60 | Fill #0

## 2018-12-04 MED FILL — busPIRone HCL 15 MG TABS: 15 | 30 days supply | Qty: 90 | Fill #0

## 2018-12-07 MED FILL — FLUVOXAMINE MALEATE 100 MG: 100 | 30 days supply | Qty: 90 | Fill #0

## 2018-12-13 MED FILL — ADDERALL XR 20 MG CAP SA: 20 | 30 days supply | Qty: 30 | Fill #0

## 2018-12-13 MED FILL — AMPHETAMINE-DEXTROAMPHETAMI: 20 | 30 days supply | Qty: 30 | Fill #0

## 2019-01-16 MED FILL — ADDERALL XR 20 MG CAP SA: 20 | 30 days supply | Qty: 30 | Fill #0

## 2019-01-16 MED FILL — AMPHETAMINE-DEXTROAMPHETAMI: 20 | 30 days supply | Qty: 30 | Fill #0

## 2019-01-23 MED FILL — FLUVOXAMINE MALEATE 100 MG: 100 | 30 days supply | Qty: 90 | Fill #1

## 2019-01-23 MED FILL — busPIRone HCL 15 MG TABS: 15 | 30 days supply | Qty: 90 | Fill #1

## 2019-01-23 MED FILL — hydrOXYzine HCL 25 MG TABS: 25 | 30 days supply | Qty: 60 | Fill #1

## 2019-01-23 MED FILL — PRAZOSIN HCL 5 MG CAPS: 5 | 30 days supply | Qty: 60 | Fill #1

## 2019-01-31 DIAGNOSIS — F4310 Post-traumatic stress disorder, unspecified: Secondary | ICD-10-CM | POA: Diagnosis not present

## 2019-01-31 DIAGNOSIS — F3181 Bipolar II disorder: Secondary | ICD-10-CM | POA: Diagnosis not present

## 2019-01-31 DIAGNOSIS — F902 Attention-deficit hyperactivity disorder, combined type: Secondary | ICD-10-CM | POA: Diagnosis not present

## 2019-01-31 DIAGNOSIS — F411 Generalized anxiety disorder: Secondary | ICD-10-CM | POA: Diagnosis not present

## 2019-01-31 MED FILL — hydrOXYzine HCL 25 MG TABS: 25 | 30 days supply | Qty: 60 | Fill #0

## 2019-01-31 MED FILL — FLUVOXAMINE MALEATE 100 MG: 100 | 30 days supply | Qty: 90 | Fill #0

## 2019-01-31 MED FILL — busPIRone HCL 15 MG TABS: 15 | 30 days supply | Qty: 90 | Fill #0

## 2019-01-31 MED FILL — PRAZOSIN HCL 5 MG CAPS: 5 | 30 days supply | Qty: 60 | Fill #0

## 2019-02-13 MED FILL — AMPHETAMINE-DEXTROAMPHETAMI: 20 | 30 days supply | Qty: 30 | Fill #0

## 2019-02-13 MED FILL — ADDERALL XR 20 MG CAP SA: 20 | 30 days supply | Qty: 30 | Fill #0

## 2019-03-14 MED FILL — AMPHETAMINE-DEXTROAMPHETAMI: 20 | 30 days supply | Qty: 30 | Fill #0

## 2019-03-14 MED FILL — ADDERALL XR 20 MG CAP SA: 20 | 30 days supply | Qty: 30 | Fill #0

## 2019-03-18 MED FILL — FLUVOXAMINE MALEATE 100 MG: 100 | 30 days supply | Qty: 90 | Fill #1

## 2019-03-18 MED FILL — PRAZOSIN HCL 5 MG CAPS: 5 | 30 days supply | Qty: 60 | Fill #1

## 2019-03-18 MED FILL — hydrOXYzine HCL 25 MG TABS: 25 | 30 days supply | Qty: 60 | Fill #1

## 2019-03-18 MED FILL — busPIRone HCL 15 MG TABS: 15 | 30 days supply | Qty: 90 | Fill #1

## 2019-04-01 DIAGNOSIS — F411 Generalized anxiety disorder: Secondary | ICD-10-CM | POA: Diagnosis not present

## 2019-04-01 DIAGNOSIS — F4310 Post-traumatic stress disorder, unspecified: Secondary | ICD-10-CM | POA: Diagnosis not present

## 2019-04-01 DIAGNOSIS — F3181 Bipolar II disorder: Secondary | ICD-10-CM | POA: Diagnosis not present

## 2019-04-01 DIAGNOSIS — F902 Attention-deficit hyperactivity disorder, combined type: Secondary | ICD-10-CM | POA: Diagnosis not present

## 2019-04-10 MED FILL — ADDERALL XR 20 MG CAP SA: 20 | 30 days supply | Qty: 30 | Fill #0

## 2019-04-11 MED FILL — AMPHETAMINE-DEXTROAMPHETAMI: 20 | 30 days supply | Qty: 30 | Fill #0

## 2019-04-19 MED FILL — hydrOXYzine HCL 25 MG TABS: 25 | 30 days supply | Qty: 60 | Fill #0

## 2019-04-19 MED FILL — busPIRone HCL 15 MG TABS: 15 | 30 days supply | Qty: 90 | Fill #0

## 2019-04-19 MED FILL — FLUVOXAMINE MALEATE 100 MG: 100 | 30 days supply | Qty: 90 | Fill #0

## 2019-04-19 MED FILL — PRAZOSIN HCL 5 MG CAPS: 5 | 30 days supply | Qty: 60 | Fill #0

## 2019-04-20 DIAGNOSIS — Z791 Long term (current) use of non-steroidal anti-inflammatories (NSAID): Secondary | ICD-10-CM | POA: Diagnosis not present

## 2019-04-20 DIAGNOSIS — Z888 Allergy status to other drugs, medicaments and biological substances status: Secondary | ICD-10-CM | POA: Diagnosis not present

## 2019-04-20 DIAGNOSIS — Z79899 Other long term (current) drug therapy: Secondary | ICD-10-CM | POA: Diagnosis not present

## 2019-04-20 DIAGNOSIS — Z881 Allergy status to other antibiotic agents status: Secondary | ICD-10-CM | POA: Diagnosis not present

## 2019-04-20 DIAGNOSIS — Z880 Allergy status to penicillin: Secondary | ICD-10-CM | POA: Diagnosis not present

## 2019-04-20 DIAGNOSIS — I158 Other secondary hypertension: Secondary | ICD-10-CM | POA: Diagnosis not present

## 2019-04-20 MED FILL — BUTALB-ACETAMIN-CAFF 50-325: 50-325-40 | 5 days supply | Qty: 20 | Fill #0

## 2019-04-23 DIAGNOSIS — R030 Elevated blood-pressure reading, without diagnosis of hypertension: Secondary | ICD-10-CM | POA: Diagnosis not present

## 2019-04-23 DIAGNOSIS — J45909 Unspecified asthma, uncomplicated: Secondary | ICD-10-CM | POA: Diagnosis not present

## 2019-04-23 DIAGNOSIS — Z13228 Encounter for screening for other metabolic disorders: Secondary | ICD-10-CM | POA: Diagnosis not present

## 2019-04-23 DIAGNOSIS — Z1329 Encounter for screening for other suspected endocrine disorder: Secondary | ICD-10-CM | POA: Diagnosis not present

## 2019-04-23 DIAGNOSIS — R5383 Other fatigue: Secondary | ICD-10-CM | POA: Diagnosis not present

## 2019-04-23 DIAGNOSIS — Z0000 Encounter for general adult medical examination without abnormal findings: Secondary | ICD-10-CM | POA: Diagnosis not present

## 2019-04-23 DIAGNOSIS — R0602 Shortness of breath: Secondary | ICD-10-CM | POA: Diagnosis not present

## 2019-04-23 DIAGNOSIS — Z13220 Encounter for screening for lipoid disorders: Secondary | ICD-10-CM | POA: Diagnosis not present

## 2019-04-23 DIAGNOSIS — Z130 Encounter for screening for diseases of the blood and blood-forming organs and certain disorders involving the immune mechanism: Secondary | ICD-10-CM | POA: Diagnosis not present

## 2019-04-23 DIAGNOSIS — E669 Obesity, unspecified: Secondary | ICD-10-CM | POA: Diagnosis not present

## 2019-04-24 MED FILL — VIT D2 1.25 MG (50,000 UNIT: 1.25 MG | 84 days supply | Qty: 12 | Fill #0

## 2019-05-01 MED FILL — TRIAZOLAM 0.25 MG TABLET: 0.25 | 3 days supply | Qty: 10 | Fill #0

## 2019-05-16 MED FILL — AMPHETAMINE-DEXTROAMPHETAMI: 20 | 30 days supply | Qty: 30 | Fill #0

## 2019-05-16 MED FILL — ADDERALL XR 20 MG CAP SA: 20 | 30 days supply | Qty: 30 | Fill #0

## 2019-05-27 DIAGNOSIS — F411 Generalized anxiety disorder: Secondary | ICD-10-CM | POA: Diagnosis not present

## 2019-05-27 DIAGNOSIS — F902 Attention-deficit hyperactivity disorder, combined type: Secondary | ICD-10-CM | POA: Diagnosis not present

## 2019-05-27 DIAGNOSIS — F4310 Post-traumatic stress disorder, unspecified: Secondary | ICD-10-CM | POA: Diagnosis not present

## 2019-05-27 DIAGNOSIS — F3181 Bipolar II disorder: Secondary | ICD-10-CM | POA: Diagnosis not present

## 2019-05-27 MED FILL — hydrOXYzine HCL 25 MG TABS: 25 | 30 days supply | Qty: 60 | Fill #0

## 2019-05-27 MED FILL — FLUVOXAMINE MALEATE 100 MG: 100 | 30 days supply | Qty: 90 | Fill #0

## 2019-05-27 MED FILL — busPIRone HCL 15 MG TABS: 15 | 30 days supply | Qty: 90 | Fill #0

## 2019-05-27 MED FILL — LAMOTRIGINE 25 MG TABS: 25 | 30 days supply | Qty: 30 | Fill #0

## 2019-05-27 MED FILL — PRAZOSIN HCL 5 MG CAPS: 5 | 30 days supply | Qty: 60 | Fill #0

## 2019-06-17 MED FILL — DEXTROAMP-AMPHETAMIN 20 MG: 20 | 30 days supply | Qty: 30 | Fill #0

## 2019-06-17 MED FILL — ADDERALL XR 20 MG CAP SA: 20 | 30 days supply | Qty: 30 | Fill #0

## 2019-07-08 MED FILL — PRAZOSIN HCL 5 MG CAPS: 5 | 30 days supply | Qty: 60 | Fill #1

## 2019-07-08 MED FILL — LAMOTRIGINE 25 MG TABS: 25 | 30 days supply | Qty: 30 | Fill #1

## 2019-07-08 MED FILL — hydrOXYzine HCL 25 MG TABS: 25 | 30 days supply | Qty: 60 | Fill #1

## 2019-07-08 MED FILL — FLUVOXAMINE MALEATE 100 MG: 100 | 30 days supply | Qty: 90 | Fill #1

## 2019-07-08 MED FILL — busPIRone HCL 15 MG TABS: 15 | 30 days supply | Qty: 90 | Fill #1

## 2019-07-16 DIAGNOSIS — H5213 Myopia, bilateral: Secondary | ICD-10-CM | POA: Diagnosis not present

## 2019-07-16 MED FILL — DEXTROAMP-AMPHETAMIN 20 MG: 20 | 30 days supply | Qty: 30 | Fill #0

## 2019-07-16 MED FILL — ADDERALL XR 20 MG CAP SA: 20 | 30 days supply | Qty: 30 | Fill #0

## 2019-07-24 DIAGNOSIS — F411 Generalized anxiety disorder: Secondary | ICD-10-CM | POA: Diagnosis not present

## 2019-07-24 DIAGNOSIS — F3181 Bipolar II disorder: Secondary | ICD-10-CM | POA: Diagnosis not present

## 2019-07-24 DIAGNOSIS — F902 Attention-deficit hyperactivity disorder, combined type: Secondary | ICD-10-CM | POA: Diagnosis not present

## 2019-07-24 DIAGNOSIS — F4310 Post-traumatic stress disorder, unspecified: Secondary | ICD-10-CM | POA: Diagnosis not present

## 2019-08-16 MED FILL — FLUVOXAMINE MALEATE 100 MG: 100 | 45 days supply | Qty: 90 | Fill #0

## 2019-08-16 MED FILL — PRAZOSIN HCL 5 MG CAPS: 5 | 30 days supply | Qty: 60 | Fill #0

## 2019-08-16 MED FILL — ADDERALL XR 20 MG CAP SA: 20 | 30 days supply | Qty: 30 | Fill #0

## 2019-08-16 MED FILL — busPIRone HCL 15 MG TABS: 15 | 30 days supply | Qty: 90 | Fill #0

## 2019-08-16 MED FILL — LAMOTRIGINE 25 MG TABS: 25 | 30 days supply | Qty: 30 | Fill #0

## 2019-08-16 MED FILL — AMPHETAMINE-DEXTROAMPHETAMI: 20 | 30 days supply | Qty: 30 | Fill #0

## 2019-08-16 MED FILL — hydrOXYzine HCL 25 MG TABS: 25 | 30 days supply | Qty: 60 | Fill #0

## 2019-09-06 DIAGNOSIS — F3181 Bipolar II disorder: Secondary | ICD-10-CM | POA: Diagnosis not present

## 2019-09-06 DIAGNOSIS — F4310 Post-traumatic stress disorder, unspecified: Secondary | ICD-10-CM | POA: Diagnosis not present

## 2019-09-06 DIAGNOSIS — F411 Generalized anxiety disorder: Secondary | ICD-10-CM | POA: Diagnosis not present

## 2019-09-06 DIAGNOSIS — F902 Attention-deficit hyperactivity disorder, combined type: Secondary | ICD-10-CM | POA: Diagnosis not present

## 2019-09-13 MED FILL — PRAZOSIN HCL 5 MG CAPS: 5 | 30 days supply | Qty: 60 | Fill #1

## 2019-09-13 MED FILL — LAMOTRIGINE 25 MG TABS: 25 | 30 days supply | Qty: 30 | Fill #1

## 2019-09-13 MED FILL — busPIRone HCL 15 MG TABS: 15 | 30 days supply | Qty: 90 | Fill #1

## 2019-09-13 MED FILL — AMPHETAMINE-DEXTROAMPHETAMI: 20 | 30 days supply | Qty: 30 | Fill #0

## 2019-09-13 MED FILL — hydrOXYzine HCL 25 MG TABS: 25 | 30 days supply | Qty: 60 | Fill #1

## 2019-09-13 MED FILL — ADDERALL XR 20 MG CAP SA: 20 | 30 days supply | Qty: 30 | Fill #0

## 2019-10-10 ENCOUNTER — Ambulatory Visit: Payer: 59 | Attending: Family

## 2019-10-10 DIAGNOSIS — Z23 Encounter for immunization: Secondary | ICD-10-CM

## 2019-10-10 NOTE — Progress Notes (Signed)
   Covid-19 Vaccination Clinic  Name:  DAVENA SANG    MRN: [id number] DOB: [birth year]  10/10/2019  Ms. Rulison was observed post Covid-19 immunization for 15 minutes without incident. She was provided with Vaccine Information Sheet and instruction to access the V-Safe system.   Ms. Matchett was instructed to call 911 with any severe reactions post vaccine: Marland Kitchen Difficulty breathing  . Swelling of face and throat  . A fast heartbeat  . A bad rash all over body  . Dizziness and weakness   Immunizations Administered    Name Date Dose VIS Date Route   Moderna COVID-19 Vaccine 10/10/2019 11:30 AM 0.5 mL 06/04/2019 Intramuscular   Manufacturer: Moderna   Lot: [id number]   [id number]

## 2019-10-18 DIAGNOSIS — M79605 Pain in left leg: Secondary | ICD-10-CM | POA: Diagnosis not present

## 2019-10-18 DIAGNOSIS — R32 Unspecified urinary incontinence: Secondary | ICD-10-CM | POA: Diagnosis not present

## 2019-10-18 MED FILL — METHYLPREDNISOLONE 4 MG TBP: 4 | 6 days supply | Qty: 21 | Fill #0

## 2019-10-18 MED FILL — NAPROXEN 500 MG TABS: 500 | 14 days supply | Qty: 28 | Fill #0

## 2019-10-21 DIAGNOSIS — G8929 Other chronic pain: Secondary | ICD-10-CM | POA: Diagnosis not present

## 2019-10-21 DIAGNOSIS — M79605 Pain in left leg: Secondary | ICD-10-CM | POA: Diagnosis not present

## 2019-10-21 DIAGNOSIS — E538 Deficiency of other specified B group vitamins: Secondary | ICD-10-CM | POA: Diagnosis not present

## 2019-10-21 DIAGNOSIS — M549 Dorsalgia, unspecified: Secondary | ICD-10-CM | POA: Diagnosis not present

## 2019-10-21 DIAGNOSIS — M5442 Lumbago with sciatica, left side: Secondary | ICD-10-CM | POA: Diagnosis not present

## 2019-10-22 DIAGNOSIS — R251 Tremor, unspecified: Secondary | ICD-10-CM | POA: Diagnosis not present

## 2019-10-22 DIAGNOSIS — M791 Myalgia, unspecified site: Secondary | ICD-10-CM | POA: Diagnosis not present

## 2019-10-22 DIAGNOSIS — R29898 Other symptoms and signs involving the musculoskeletal system: Secondary | ICD-10-CM | POA: Diagnosis not present

## 2019-10-22 DIAGNOSIS — R270 Ataxia, unspecified: Secondary | ICD-10-CM | POA: Diagnosis not present

## 2019-10-22 DIAGNOSIS — M549 Dorsalgia, unspecified: Secondary | ICD-10-CM | POA: Diagnosis not present

## 2019-10-22 DIAGNOSIS — R32 Unspecified urinary incontinence: Secondary | ICD-10-CM | POA: Diagnosis not present

## 2019-10-22 DIAGNOSIS — R531 Weakness: Secondary | ICD-10-CM | POA: Diagnosis not present

## 2019-10-22 DIAGNOSIS — R42 Dizziness and giddiness: Secondary | ICD-10-CM | POA: Diagnosis not present

## 2019-10-22 DIAGNOSIS — R2689 Other abnormalities of gait and mobility: Secondary | ICD-10-CM | POA: Diagnosis not present

## 2019-10-22 MED FILL — diazePAM 5 MG TABS: 5 | 2 days supply | Qty: 4 | Fill #0

## 2019-10-23 MED FILL — ADDERALL XR 20 MG CAP SA: 20 | 7 days supply | Qty: 7 | Fill #0

## 2019-10-23 MED FILL — AMPHETAMINE-DEXTROAMPHETAMI: 20 | 7 days supply | Qty: 7 | Fill #0

## 2019-10-23 MED FILL — VITAMIN B-12 1000 MCG TABS: 1000 | 130 days supply | Qty: 130 | Fill #0

## 2019-10-29 DIAGNOSIS — Z888 Allergy status to other drugs, medicaments and biological substances status: Secondary | ICD-10-CM | POA: Diagnosis not present

## 2019-10-29 DIAGNOSIS — Z791 Long term (current) use of non-steroidal anti-inflammatories (NSAID): Secondary | ICD-10-CM | POA: Diagnosis not present

## 2019-10-29 DIAGNOSIS — Z79899 Other long term (current) drug therapy: Secondary | ICD-10-CM | POA: Diagnosis not present

## 2019-10-29 DIAGNOSIS — R531 Weakness: Secondary | ICD-10-CM | POA: Diagnosis not present

## 2019-10-29 DIAGNOSIS — R079 Chest pain, unspecified: Secondary | ICD-10-CM | POA: Diagnosis not present

## 2019-10-29 DIAGNOSIS — R410 Disorientation, unspecified: Secondary | ICD-10-CM | POA: Diagnosis not present

## 2019-10-29 DIAGNOSIS — F411 Generalized anxiety disorder: Secondary | ICD-10-CM | POA: Diagnosis not present

## 2019-10-29 DIAGNOSIS — F3181 Bipolar II disorder: Secondary | ICD-10-CM | POA: Diagnosis not present

## 2019-10-29 DIAGNOSIS — R202 Paresthesia of skin: Secondary | ICD-10-CM | POA: Diagnosis not present

## 2019-10-29 DIAGNOSIS — Z880 Allergy status to penicillin: Secondary | ICD-10-CM | POA: Diagnosis not present

## 2019-10-29 DIAGNOSIS — F419 Anxiety disorder, unspecified: Secondary | ICD-10-CM | POA: Diagnosis not present

## 2019-10-29 DIAGNOSIS — R930 Abnormal findings on diagnostic imaging of skull and head, not elsewhere classified: Secondary | ICD-10-CM | POA: Diagnosis not present

## 2019-10-29 DIAGNOSIS — S0993XA Unspecified injury of face, initial encounter: Secondary | ICD-10-CM | POA: Diagnosis not present

## 2019-10-29 DIAGNOSIS — F4310 Post-traumatic stress disorder, unspecified: Secondary | ICD-10-CM | POA: Diagnosis not present

## 2019-10-29 DIAGNOSIS — R9431 Abnormal electrocardiogram [ECG] [EKG]: Secondary | ICD-10-CM | POA: Diagnosis not present

## 2019-10-29 DIAGNOSIS — F902 Attention-deficit hyperactivity disorder, combined type: Secondary | ICD-10-CM | POA: Diagnosis not present

## 2019-11-01 DIAGNOSIS — R270 Ataxia, unspecified: Secondary | ICD-10-CM | POA: Diagnosis not present

## 2019-11-01 DIAGNOSIS — R2689 Other abnormalities of gait and mobility: Secondary | ICD-10-CM | POA: Diagnosis not present

## 2019-11-01 DIAGNOSIS — R251 Tremor, unspecified: Secondary | ICD-10-CM | POA: Diagnosis not present

## 2019-11-01 DIAGNOSIS — R531 Weakness: Secondary | ICD-10-CM | POA: Diagnosis not present

## 2019-11-12 ENCOUNTER — Ambulatory Visit: Payer: 59 | Attending: Family

## 2019-11-12 DIAGNOSIS — Z23 Encounter for immunization: Secondary | ICD-10-CM

## 2019-11-12 NOTE — Progress Notes (Signed)
   Covid-19 Vaccination Clinic  Name:  [name]    MRN: [id number] DOB: Feb 04, 1986  11/12/2019  [name] was observed post Covid-19 immunization for 15 minutes without incident. She was provided with Vaccine Information Sheet and instruction to access the V-Safe system.   [name] was instructed to call 911 with any severe reactions post vaccine: Marland Kitchen Difficulty breathing  . Swelling of face and throat  . A fast heartbeat  . A bad rash all over body  . Dizziness and weakness   Immunizations Administered    Name Date Dose VIS Date Route   Moderna COVID-19 Vaccine 11/12/2019 10:52 AM 0.5 mL 06/2019 Intramuscular   Manufacturer: Moderna   Lot: [id number]   Old [id number]

## 2019-11-26 DIAGNOSIS — F902 Attention-deficit hyperactivity disorder, combined type: Secondary | ICD-10-CM | POA: Diagnosis not present

## 2019-11-26 DIAGNOSIS — F4310 Post-traumatic stress disorder, unspecified: Secondary | ICD-10-CM | POA: Diagnosis not present

## 2019-11-26 DIAGNOSIS — F411 Generalized anxiety disorder: Secondary | ICD-10-CM | POA: Diagnosis not present

## 2019-11-26 DIAGNOSIS — F3181 Bipolar II disorder: Secondary | ICD-10-CM | POA: Diagnosis not present

## 2019-12-02 ENCOUNTER — Ambulatory Visit (INDEPENDENT_AMBULATORY_CARE_PROVIDER_SITE_OTHER): Payer: 59

## 2019-12-02 ENCOUNTER — Ambulatory Visit (HOSPITAL_COMMUNITY)
Admission: EM | Admit: 2019-12-02 | Discharge: 2019-12-02 | Disposition: A | Discharge status: Home / Self Care | Payer: 59 | Attending: Family Medicine | Admitting: Family Medicine

## 2019-12-02 ENCOUNTER — Encounter (HOSPITAL_COMMUNITY): Payer: Self-pay | Admitting: Emergency Medicine

## 2019-12-02 ENCOUNTER — Other Ambulatory Visit: Payer: Self-pay

## 2019-12-02 DIAGNOSIS — S62316A Displaced fracture of base of fifth metacarpal bone, right hand, initial encounter for closed fracture: Secondary | ICD-10-CM

## 2019-12-02 DIAGNOSIS — S62344A Nondisplaced fracture of base of fourth metacarpal bone, right hand, initial encounter for closed fracture: Secondary | ICD-10-CM

## 2019-12-02 DIAGNOSIS — M79641 Pain in right hand: Secondary | ICD-10-CM | POA: Diagnosis not present

## 2019-12-02 MED ORDER — HYDROCODONE-ACETAMINOPHEN 5-325 MG PO TABS: 1.0000 | ORAL_TABLET | Freq: Four times a day (QID) | ORAL | 0 refills | Status: AC | PRN

## 2019-12-02 MED ORDER — HYDROCODONE-ACETAMINOPHEN 5-325 MG PO TABS
2.0000 | ORAL_TABLET | Freq: Once | ORAL | Status: AC
  Administered 2019-12-02: 2 via ORAL

## 2019-12-02 MED ORDER — HYDROCODONE-ACETAMINOPHEN 5-325 MG PO TABS
ORAL_TABLET | ORAL | Status: AC
  Filled 2019-12-02: qty 2

## 2019-12-02 NOTE — ED Triage Notes (Signed)
Patient hit a wall today.  Patient hit with a closed fist.  Abrasion to knuckles at right little and ring finger.  Right radial pulse is 2 +, able to move all fingers slightly.  Pain in right hand, but also mentions pain in distal forearm, not so much wrist.

## 2019-12-02 NOTE — Discharge Instructions (Signed)
Leave brace on Elevate hand Use ice for 20 minutes every couple of hours Call Dr. Biagio Borg office tomorrow to set up an appointment Take pain medication as needed Do not drive on pain medication

## 2019-12-02 NOTE — ED Notes (Signed)
Ortho tech called and on the way

## 2019-12-02 NOTE — ED Notes (Signed)
Ice pack provided

## 2019-12-02 NOTE — Progress Notes (Signed)
Orthopedic Tech Progress Note Patient Details:  [name] [birth year] [id number]  Ortho Devices Type of Ortho Device: Arm sling, Ulna gutter splint Ortho Device/Splint Location: RUE Ortho Device/Splint Interventions: Ordered, Application   Post Interventions Patient Tolerated: Well Instructions Provided: Care of [place] 12/02/2019, 11:54 AM

## 2019-12-02 NOTE — ED Provider Notes (Signed)
[facility]    CSN: [id number] Arrival date & time: 12/02/19  [id number]      History   Chief Complaint Chief Complaint  Patient presents with  . Wrist Injury    HPI [name] is a 34 y.o. female.   HPI Patient states that she punched a wall today.  She is quite angry.  She has never done anything like this before.  Her hand is very painful and swollen.  Patient does state that she has bipolar disease and depression.  She is on medications.  She states that she feels like she is well controlled.  Her husband is here with her. Past Medical History:  Diagnosis Date  . Allergy    seasonal allergies  . Asthma   . Bipolar disorder ([place])   . Depression    ok currently  . Fibroids   . Headache(784.0)    per patient tx for HA  . History of blood transfusion 11/2011   [place]   . Ovarian cyst   . Pelvic cyst    Seen by gynecologist Dr. Harrington Challenger. Scheduled for ex lap on 12/02/11    Patient Active Problem List   Diagnosis Date Noted  . Bacterial vaginosis 09/12/2018  . Candidal vulvovaginitis 09/12/2018  . Menopausal symptom 09/12/2018  . Right lower quadrant pain 09/12/2018  . Vaginal discharge 09/12/2018  . Vaginal odor 09/12/2018  . Wheeze 11/21/2014  . Back pain 11/03/2014  . Pelvic abscess in female 11/24/2013  . S/P TAH (total abdominal hysterectomy) 11/12/2013  . Fibroids, intramural 11/11/2013  . Leiomyoma of uterus 10/06/2013  . Bipolar 2 disorder ([place]) 09/18/2013  . Morbid obesity ([place]) 08/30/2013  . Low back pain 02/09/2013  . Tooth pain 01/12/2013  . Strain of iliopsoas muscle 12/04/2012  . Galactorrhea 07/12/2012  . Migraine headache 07/12/2012  . Paresthesia 07/12/2012  . RHINITIS, ALLERGIC 08/31/2006  . MENSTRUATION, PAINFUL 08/31/2006    Past Surgical History:  Procedure Laterality Date  . ABDOMINAL HYSTERECTOMY N/A 11/12/2013   Procedure: Abdominal Hysterectomy with bilateral salpingectomy, and Right oophorectomy;  Surgeon: Janyth Contes, MD;  Location: [facility];  Service: Gynecology;  Laterality: N/A;  . LAPAROSCOPY N/A 12/30/2014   Procedure: LAPAROSCOPY OPERATIVE;  Surgeon: Paula Compton, MD;  Location: [facility];  Service: Gynecology;  Laterality: N/A;  . MYOMECTOMY  12/02/2011   Procedure: MYOMECTOMY;  Surgeon: Gus Height, MD;  Location: [facility];  Service: Gynecology;  Laterality: N/A;  . SCAR REVISION N/A 11/12/2013   Procedure: SCAR REVISION;  Surgeon: Janyth Contes, MD;  Location: [facility];  Service: Gynecology;  Laterality: N/A;    OB History    Gravida  0   Para      Term      Preterm      AB      Living        SAB      TAB      Ectopic      Multiple      Live Births               Home Medications    Prior to Admission medications   Medication Sig Start Date End Date Taking? Authorizing Provider  buPROPion (WELLBUTRIN XL) 300 MG 24 hr tablet Take 1 tablet by mouth. 07/22/16  Yes [provider]  busPIRone (BUSPAR) 15 MG tablet  09/11/18  Yes [provider]  citalopram (CELEXA) 40 MG tablet  07/22/16  Yes [provider]  fluvoxaMINE (LUVOX)  100 MG tablet  09/07/18  Yes [provider]  amphetamine-dextroamphetamine (ADDERALL) 20 MG tablet Take 1 tablet by mouth 2 (two) times daily.  12/02/19 Yes [provider]  albuterol (PROVENTIL HFA;VENTOLIN HFA) 108 (90 BASE) MCG/ACT inhaler Inhale 2 puffs into the lungs every 4 (four) hours as needed for wheezing or shortness of breath. 11/21/14   Archie Patten, MD  HYDROcodone-acetaminophen (NORCO/VICODIN) 5-325 MG tablet Take 1-2 tablets by mouth every 6 (six) hours as needed. 12/02/19   Raylene Everts, MD  clonazePAM Bobbye [place]) 0.5 MG tablet  07/22/16 12/02/19  [provider]  haloperidol (HALDOL) 5 MG tablet  08/06/18 12/02/19  [provider]  prazosin (MINIPRESS) 5 MG capsule  07/22/16 12/02/19  [provider]  pregabalin (LYRICA) 75 MG capsule Take 1 capsule  (75 mg total) by mouth 3 (three) times daily. 09/08/16 12/02/19  Bayard Hugger, NP    Family History Family History  Problem Relation Age of Onset  . Heart disease Mother   . Diabetes Paternal Aunt     Social History Social History   Tobacco Use  . Smoking status: Never Smoker  . Smokeless tobacco: Never Used  Substance Use Topics  . Alcohol use: No  . Drug use: No     Allergies   Dilaudid [hydromorphone hcl], Clindamycin/lincomycin, Latex, and Penicillins   Review of Systems Review of Systems  Musculoskeletal: Positive for arthralgias.       Hand pain right     Physical Exam Triage Vital Signs ED Triage Vitals  Enc Vitals Group     BP 12/02/19 1010 (!) 141/88     Pulse Rate 12/02/19 1010 90     Resp 12/02/19 1010 16     Temp --      Temp src --      SpO2 12/02/19 1010 95 %     Weight --      Height --      Head Circumference --      Peak Flow --      Pain Score 12/02/19 1019 10     Pain Loc --      Pain Edu? --      Excl. in [place]? --    No data found.  Updated Vital Signs BP (!) 141/88 (BP Location: Left Arm)   Pulse 90   Resp 16   LMP 09/29/2013   SpO2 95%   Physical Exam Constitutional:      General: She is not in acute distress.    Appearance: She is well-developed.  HENT:     Head: Normocephalic and atraumatic.  Eyes:     Conjunctiva/sclera: Conjunctivae normal.     Pupils: Pupils are equal, round, and reactive to light.  Cardiovascular:     Rate and Rhythm: Normal rate.  Pulmonary:     Effort: Pulmonary effort is normal. No respiratory distress.  Abdominal:     General: There is no distension.     Palpations: Abdomen is soft.  Musculoskeletal:        General: Normal range of motion.     Cervical back: Normal range of motion.     Comments: Swelling and tenderness over the fourth and fifth metatarsals at the carpal region.  Mild tenderness over the carpal bones.  Very limited range of motion of fingers and wrist secondary to pain.   Sensory exam is normal.  No rotational defect  Skin:    General: Skin is warm and dry.  Comments: Superficial abrasions over the lateral hand, fourth and fifth knuckles.  Neurological:     Mental Status: She is alert.  Psychiatric:        Mood and Affect: Mood normal.        Behavior: Behavior normal.      UC Treatments / Results  Labs (all labs ordered are listed, but only abnormal results are displayed) Labs Reviewed - No data to display  EKG   Radiology DG Hand Complete Right  Result Date: 12/02/2019 CLINICAL DATA:  Right hand pain after punching wall EXAM: RIGHT HAND - COMPLETE 3+ VIEW COMPARISON:  None. FINDINGS: Intra-articular proximal right fourth and fifth metacarpal fractures with surrounding soft tissue swelling, with minimal 2 mm dorsal displacement of the distal fracture fragment in the right fifth metacarpal and without significant displacement in the right fourth metacarpal. No dislocation. No focal osseous lesions. No significant arthropathy. No radiopaque foreign bodies. IMPRESSION: Intra-articular proximal right fourth and fifth metacarpal fractures as detailed. Electronically Signed   By: Ilona Sorrel M.D.   On: 12/02/2019 10:52    Procedures Procedures (including critical care time)  Medications Ordered in UC Medications  HYDROcodone-acetaminophen (NORCO/VICODIN) 5-325 MG per tablet 2 tablet (has no administration in time range)    Initial Impression / Assessment and Plan / UC Course  I have reviewed the triage vital signs and the nursing notes.  Pertinent labs & imaging results that were available during my care of the patient were reviewed by me and considered in my medical decision making (see chart for details).     Discussed with patient and husband Call tomorrow for follow up Called [facility] x 2 and listened to recordings - no person answers in 5 min Final Clinical Impressions(s) / UC Diagnoses   Final diagnoses:  Closed displaced  fracture of base of fifth metacarpal bone of right hand, initial encounter  Closed nondisplaced fracture of base of fourth metacarpal bone of right hand, initial encounter     Discharge Instructions     Leave brace on Elevate hand Use ice for 20 minutes every couple of hours Call Dr. Biagio Borg office tomorrow to set up an appointment Take pain medication as needed Do not drive on pain medication    ED Prescriptions    Medication Sig Dispense Auth. Provider   HYDROcodone-acetaminophen (NORCO/VICODIN) 5-325 MG tablet Take 1-2 tablets by mouth every 6 (six) hours as needed. 10 tablet Raylene Everts, MD     I have reviewed the PDMP during this encounter.   Raylene Everts, MD 12/02/19 1125

## 2019-12-03 DIAGNOSIS — S62344A Nondisplaced fracture of base of fourth metacarpal bone, right hand, initial encounter for closed fracture: Secondary | ICD-10-CM | POA: Diagnosis not present

## 2019-12-03 DIAGNOSIS — S62316A Displaced fracture of base of fifth metacarpal bone, right hand, initial encounter for closed fracture: Secondary | ICD-10-CM | POA: Diagnosis not present

## 2019-12-04 DIAGNOSIS — S62316A Displaced fracture of base of fifth metacarpal bone, right hand, initial encounter for closed fracture: Secondary | ICD-10-CM | POA: Diagnosis not present

## 2019-12-04 DIAGNOSIS — S62314A Displaced fracture of base of fourth metacarpal bone, right hand, initial encounter for closed fracture: Secondary | ICD-10-CM | POA: Diagnosis not present

## 2019-12-10 DIAGNOSIS — S62314A Displaced fracture of base of fourth metacarpal bone, right hand, initial encounter for closed fracture: Secondary | ICD-10-CM | POA: Diagnosis not present

## 2019-12-10 DIAGNOSIS — E669 Obesity, unspecified: Secondary | ICD-10-CM | POA: Diagnosis not present

## 2019-12-10 DIAGNOSIS — S62316A Displaced fracture of base of fifth metacarpal bone, right hand, initial encounter for closed fracture: Secondary | ICD-10-CM | POA: Diagnosis not present

## 2019-12-10 DIAGNOSIS — Z79899 Other long term (current) drug therapy: Secondary | ICD-10-CM | POA: Diagnosis not present

## 2019-12-10 DIAGNOSIS — Z6833 Body mass index (BMI) 33.0-33.9, adult: Secondary | ICD-10-CM | POA: Diagnosis not present

## 2019-12-10 DIAGNOSIS — F419 Anxiety disorder, unspecified: Secondary | ICD-10-CM | POA: Diagnosis not present

## 2019-12-11 DIAGNOSIS — R413 Other amnesia: Secondary | ICD-10-CM | POA: Diagnosis not present

## 2019-12-11 DIAGNOSIS — R269 Unspecified abnormalities of gait and mobility: Secondary | ICD-10-CM | POA: Diagnosis not present

## 2019-12-11 DIAGNOSIS — M549 Dorsalgia, unspecified: Secondary | ICD-10-CM | POA: Diagnosis not present

## 2019-12-11 DIAGNOSIS — R29818 Other symptoms and signs involving the nervous system: Secondary | ICD-10-CM | POA: Diagnosis not present

## 2019-12-11 DIAGNOSIS — G8929 Other chronic pain: Secondary | ICD-10-CM | POA: Diagnosis not present

## 2019-12-23 DIAGNOSIS — S62316D Displaced fracture of base of fifth metacarpal bone, right hand, subsequent encounter for fracture with routine healing: Secondary | ICD-10-CM | POA: Diagnosis not present

## 2019-12-23 DIAGNOSIS — S62314D Displaced fracture of base of fourth metacarpal bone, right hand, subsequent encounter for fracture with routine healing: Secondary | ICD-10-CM | POA: Diagnosis not present

## 2019-12-24 DIAGNOSIS — R413 Other amnesia: Secondary | ICD-10-CM | POA: Diagnosis not present

## 2019-12-24 DIAGNOSIS — Z7689 Persons encountering health services in other specified circumstances: Secondary | ICD-10-CM | POA: Diagnosis not present

## 2019-12-24 DIAGNOSIS — R531 Weakness: Secondary | ICD-10-CM | POA: Diagnosis not present

## 2019-12-24 DIAGNOSIS — Z77120 Contact with and (suspected) exposure to mold (toxic): Secondary | ICD-10-CM | POA: Diagnosis not present

## 2019-12-24 DIAGNOSIS — J45909 Unspecified asthma, uncomplicated: Secondary | ICD-10-CM | POA: Diagnosis not present

## 2019-12-24 DIAGNOSIS — R269 Unspecified abnormalities of gait and mobility: Secondary | ICD-10-CM | POA: Diagnosis not present

## 2019-12-24 DIAGNOSIS — F419 Anxiety disorder, unspecified: Secondary | ICD-10-CM | POA: Diagnosis not present

## 2019-12-24 DIAGNOSIS — F3181 Bipolar II disorder: Secondary | ICD-10-CM | POA: Diagnosis not present

## 2019-12-25 DIAGNOSIS — S62316D Displaced fracture of base of fifth metacarpal bone, right hand, subsequent encounter for fracture with routine healing: Secondary | ICD-10-CM | POA: Diagnosis not present

## 2019-12-25 DIAGNOSIS — Z4789 Encounter for other orthopedic aftercare: Secondary | ICD-10-CM | POA: Diagnosis not present

## 2020-01-15 MED FILL — FLUVOXAMINE MALEATE 100 MG: 100 | 30 days supply | Qty: 90 | Fill #0

## 2020-01-15 MED FILL — busPIRone HCL 15 MG TABS: 15 | 30 days supply | Qty: 60 | Fill #0

## 2020-01-15 MED FILL — ADDERALL XR 20 MG CAP SA: 20 | 30 days supply | Qty: 30 | Fill #0

## 2020-01-15 MED FILL — hydrOXYzine HCL 25 MG TABS: 25 | 15 days supply | Qty: 30 | Fill #0

## 2020-01-15 MED FILL — PRAZOSIN HCL 5 MG CAPS: 5 | 90 days supply | Qty: 90 | Fill #0

## 2020-01-20 DIAGNOSIS — Z967 Presence of other bone and tendon implants: Secondary | ICD-10-CM | POA: Diagnosis not present

## 2020-01-20 DIAGNOSIS — S62314D Displaced fracture of base of fourth metacarpal bone, right hand, subsequent encounter for fracture with routine healing: Secondary | ICD-10-CM | POA: Diagnosis not present

## 2020-01-20 DIAGNOSIS — S62316D Displaced fracture of base of fifth metacarpal bone, right hand, subsequent encounter for fracture with routine healing: Secondary | ICD-10-CM | POA: Diagnosis not present

## 2020-01-21 DIAGNOSIS — S62316D Displaced fracture of base of fifth metacarpal bone, right hand, subsequent encounter for fracture with routine healing: Secondary | ICD-10-CM | POA: Diagnosis not present

## 2020-01-21 DIAGNOSIS — R29898 Other symptoms and signs involving the musculoskeletal system: Secondary | ICD-10-CM | POA: Diagnosis not present

## 2020-01-21 DIAGNOSIS — Z9889 Other specified postprocedural states: Secondary | ICD-10-CM | POA: Diagnosis not present

## 2020-01-21 DIAGNOSIS — M79641 Pain in right hand: Secondary | ICD-10-CM | POA: Diagnosis not present

## 2020-01-21 DIAGNOSIS — S62314D Displaced fracture of base of fourth metacarpal bone, right hand, subsequent encounter for fracture with routine healing: Secondary | ICD-10-CM | POA: Diagnosis not present

## 2020-01-21 DIAGNOSIS — M6281 Muscle weakness (generalized): Secondary | ICD-10-CM | POA: Diagnosis not present

## 2020-01-22 DIAGNOSIS — R410 Disorientation, unspecified: Secondary | ICD-10-CM | POA: Diagnosis not present

## 2020-01-22 DIAGNOSIS — R413 Other amnesia: Secondary | ICD-10-CM | POA: Diagnosis not present

## 2020-01-28 DIAGNOSIS — M79641 Pain in right hand: Secondary | ICD-10-CM | POA: Diagnosis not present

## 2020-01-28 DIAGNOSIS — R29898 Other symptoms and signs involving the musculoskeletal system: Secondary | ICD-10-CM | POA: Diagnosis not present

## 2020-01-28 DIAGNOSIS — M6281 Muscle weakness (generalized): Secondary | ICD-10-CM | POA: Diagnosis not present

## 2020-01-28 DIAGNOSIS — Z9889 Other specified postprocedural states: Secondary | ICD-10-CM | POA: Diagnosis not present

## 2020-01-28 DIAGNOSIS — S62316D Displaced fracture of base of fifth metacarpal bone, right hand, subsequent encounter for fracture with routine healing: Secondary | ICD-10-CM | POA: Diagnosis not present

## 2020-01-28 DIAGNOSIS — S62314D Displaced fracture of base of fourth metacarpal bone, right hand, subsequent encounter for fracture with routine healing: Secondary | ICD-10-CM | POA: Diagnosis not present

## 2020-02-04 DIAGNOSIS — M25641 Stiffness of right hand, not elsewhere classified: Secondary | ICD-10-CM | POA: Diagnosis not present

## 2020-02-04 DIAGNOSIS — S62316D Displaced fracture of base of fifth metacarpal bone, right hand, subsequent encounter for fracture with routine healing: Secondary | ICD-10-CM | POA: Diagnosis not present

## 2020-02-04 DIAGNOSIS — R29898 Other symptoms and signs involving the musculoskeletal system: Secondary | ICD-10-CM | POA: Diagnosis not present

## 2020-02-04 DIAGNOSIS — S62314D Displaced fracture of base of fourth metacarpal bone, right hand, subsequent encounter for fracture with routine healing: Secondary | ICD-10-CM | POA: Diagnosis not present

## 2020-02-17 DIAGNOSIS — M7989 Other specified soft tissue disorders: Secondary | ICD-10-CM | POA: Diagnosis not present

## 2020-02-17 DIAGNOSIS — Z885 Allergy status to narcotic agent status: Secondary | ICD-10-CM | POA: Diagnosis not present

## 2020-02-17 DIAGNOSIS — Z880 Allergy status to penicillin: Secondary | ICD-10-CM | POA: Diagnosis not present

## 2020-02-17 DIAGNOSIS — Z79899 Other long term (current) drug therapy: Secondary | ICD-10-CM | POA: Diagnosis not present

## 2020-02-17 DIAGNOSIS — S60221A Contusion of right hand, initial encounter: Secondary | ICD-10-CM | POA: Diagnosis not present

## 2020-02-17 DIAGNOSIS — Z883 Allergy status to other anti-infective agents status: Secondary | ICD-10-CM | POA: Diagnosis not present

## 2020-03-02 DIAGNOSIS — S62316D Displaced fracture of base of fifth metacarpal bone, right hand, subsequent encounter for fracture with routine healing: Secondary | ICD-10-CM | POA: Diagnosis not present

## 2020-03-02 DIAGNOSIS — S62314D Displaced fracture of base of fourth metacarpal bone, right hand, subsequent encounter for fracture with routine healing: Secondary | ICD-10-CM | POA: Diagnosis not present

## 2020-03-16 MED FILL — FLUVOXAMINE MALEATE 100 MG: 100 | 30 days supply | Qty: 90 | Fill #0

## 2020-03-16 MED FILL — ADDERALL XR 20 MG CAP SA: 20 | 30 days supply | Qty: 30 | Fill #0

## 2020-03-16 MED FILL — HYDROXYZINE HCL 25 MG TABS: 25 | 15 days supply | Qty: 30 | Fill #0

## 2020-03-20 DIAGNOSIS — R2689 Other abnormalities of gait and mobility: Secondary | ICD-10-CM | POA: Diagnosis not present

## 2020-03-20 DIAGNOSIS — R531 Weakness: Secondary | ICD-10-CM | POA: Diagnosis not present

## 2020-03-20 DIAGNOSIS — Z23 Encounter for immunization: Secondary | ICD-10-CM | POA: Diagnosis not present

## 2020-03-20 DIAGNOSIS — F3181 Bipolar II disorder: Secondary | ICD-10-CM | POA: Diagnosis not present

## 2020-03-20 DIAGNOSIS — M791 Myalgia, unspecified site: Secondary | ICD-10-CM | POA: Diagnosis not present

## 2020-03-20 DIAGNOSIS — R202 Paresthesia of skin: Secondary | ICD-10-CM | POA: Diagnosis not present

## 2020-03-20 DIAGNOSIS — R251 Tremor, unspecified: Secondary | ICD-10-CM | POA: Diagnosis not present

## 2020-03-20 DIAGNOSIS — R270 Ataxia, unspecified: Secondary | ICD-10-CM | POA: Diagnosis not present

## 2020-04-10 ENCOUNTER — Other Ambulatory Visit (HOSPITAL_COMMUNITY): Payer: Self-pay | Admitting: Psychiatry

## 2020-04-10 MED FILL — QUETIAPINE FUMARATE 100 MG: 100 | 30 days supply | Qty: 30 | Fill #0

## 2020-04-10 MED FILL — PRAZOSIN HCL 2 MG CAPS: 2 | 30 days supply | Qty: 30 | Fill #0

## 2020-05-19 ENCOUNTER — Other Ambulatory Visit (HOSPITAL_COMMUNITY): Payer: Self-pay | Admitting: Psychiatry

## 2020-05-19 MED FILL — QUETIAPINE FUMARATE 200 MG: 200 | 30 days supply | Qty: 30 | Fill #0

## 2020-05-27 MED FILL — buPROPion HCL ER (XL) 150 M: 150 | 30 days supply | Qty: 30 | Fill #0

## 2020-06-05 MED FILL — PRAZOSIN HCL 2 MG CAPS: 2 | 30 days supply | Qty: 30 | Fill #1

## 2020-07-10 ENCOUNTER — Other Ambulatory Visit (HOSPITAL_COMMUNITY): Payer: Self-pay | Admitting: Psychiatry

## 2020-07-10 MED FILL — buPROPion HCL ER (XL) 150 M: 150 | 30 days supply | Qty: 30 | Fill #0

## 2020-07-10 MED FILL — FLUVOXAMINE MALEATE 100 MG: 100 | 30 days supply | Qty: 30 | Fill #0

## 2020-07-10 MED FILL — QUETIAPINE FUMARATE 200 MG: 200 | 30 days supply | Qty: 30 | Fill #0

## 2020-08-11 ENCOUNTER — Other Ambulatory Visit (HOSPITAL_COMMUNITY): Payer: Self-pay | Admitting: Psychiatry

## 2020-08-11 MED FILL — buPROPion HCL ER (XL) 300 M: 300 | 30 days supply | Qty: 30 | Fill #0

## 2020-08-21 MED FILL — QUETIAPINE FUMARATE 200 MG: 200 | 30 days supply | Qty: 30 | Fill #1

## 2020-08-21 MED FILL — buPROPion HCL ER (XL) 300 M: 300 | 30 days supply | Qty: 30 | Fill #0

## 2020-09-18 ENCOUNTER — Other Ambulatory Visit (HOSPITAL_COMMUNITY): Payer: Self-pay | Admitting: Psychiatry

## 2020-09-30 MED FILL — FLUVOXAMINE MALEATE 100 MG: 100 | 30 days supply | Qty: 30 | Fill #1

## 2020-09-30 MED FILL — QUETIAPINE FUMARATE 200 MG: 200 | 30 days supply | Qty: 30 | Fill #0

## 2021-04-16 ENCOUNTER — Other Ambulatory Visit (HOSPITAL_COMMUNITY): Payer: Self-pay

## 2021-10-09 IMAGING — DX DG HAND COMPLETE 3+V*R*
2 series · 2 of 2 positions shown · non-contrast
Comparison: None.

CLINICAL DATA: Right hand pain after punching wall

EXAM:
RIGHT HAND - COMPLETE 3+ VIEW

[hand pa]
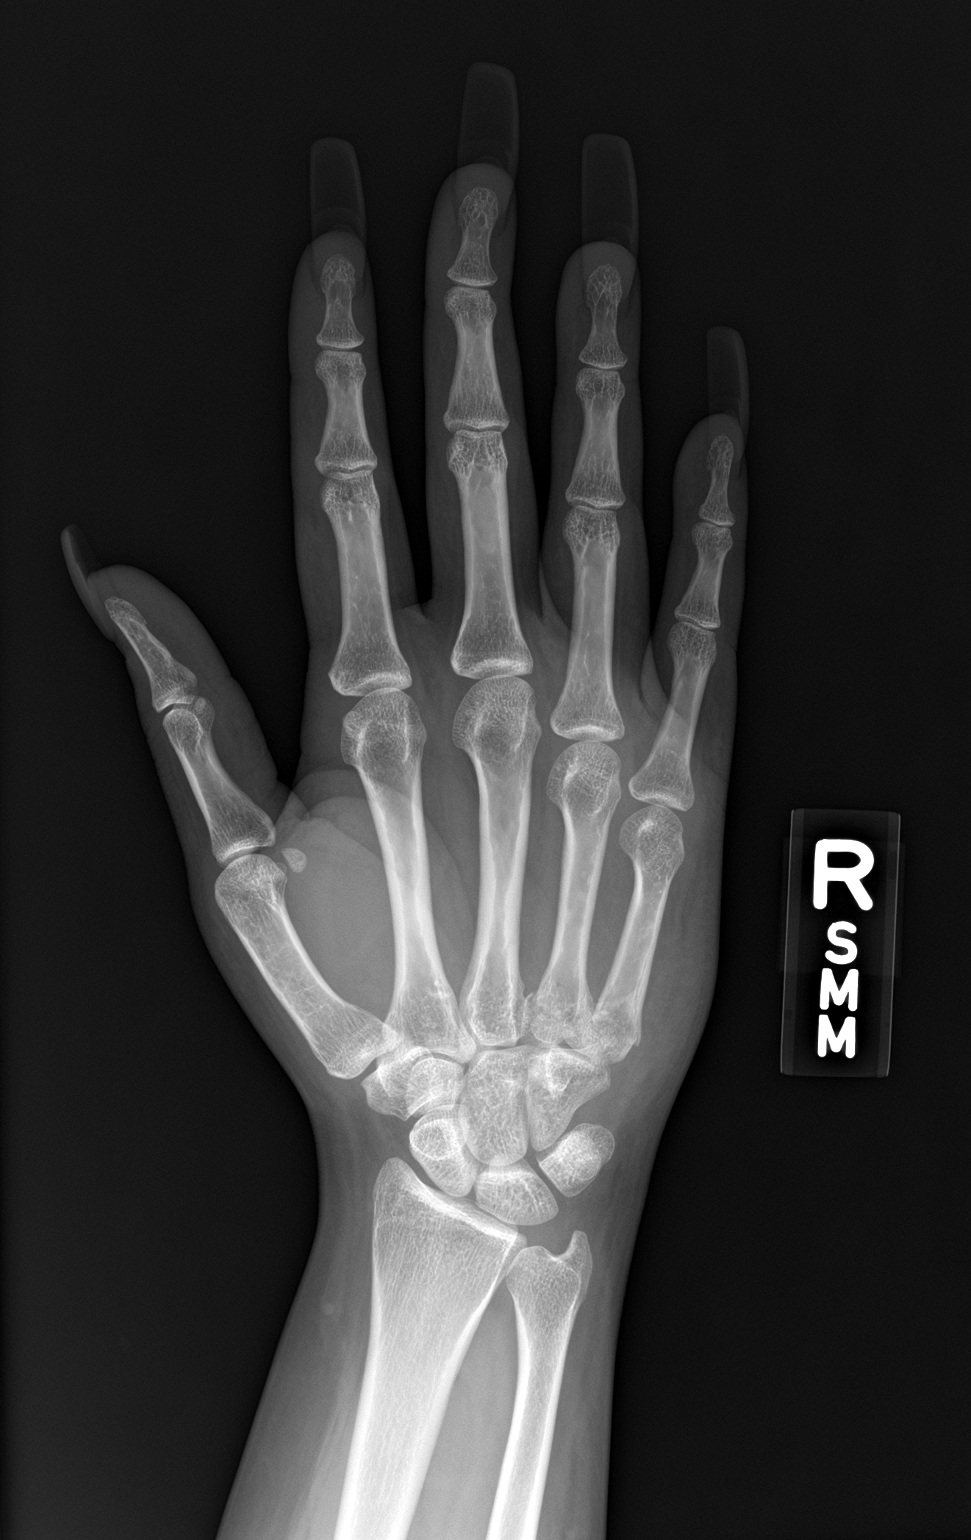

[hand obl]
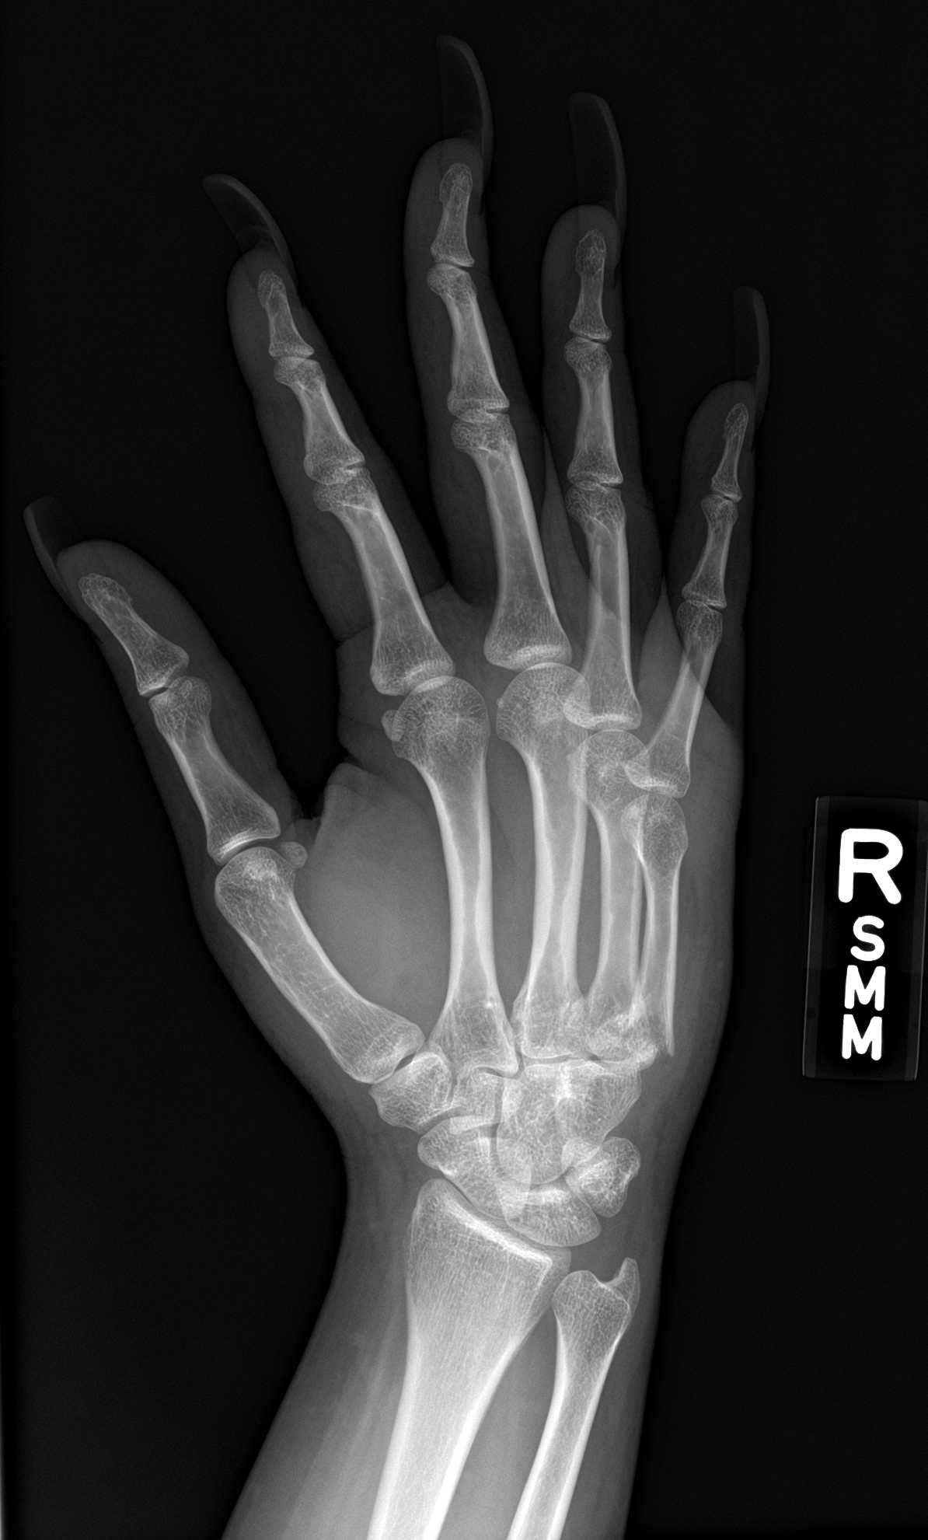

[2 of 2 positions shown; findings below may reference images not displayed]

FINDINGS: Intra-articular proximal right fourth and fifth metacarpal fractures
with surrounding soft tissue swelling, with minimal 2 mm dorsal
displacement of the distal fracture fragment in the right fifth
metacarpal and without significant displacement in the right fourth
metacarpal. No dislocation. No focal osseous lesions. No significant
arthropathy. No radiopaque foreign bodies.
IMPRESSION: Intra-articular proximal right fourth and fifth metacarpal fractures
as detailed.

## 2021-12-20 ENCOUNTER — Emergency Department (HOSPITAL_COMMUNITY)
Admission: EM | Admit: 2021-12-20 | Discharge: 2021-12-20 | Disposition: A | Discharge status: Home / Self Care | Payer: No Typology Code available for payment source | Attending: Emergency Medicine | Admitting: Emergency Medicine

## 2021-12-20 ENCOUNTER — Encounter (HOSPITAL_COMMUNITY): Payer: Self-pay

## 2021-12-20 DIAGNOSIS — R21 Rash and other nonspecific skin eruption: Secondary | ICD-10-CM | POA: Diagnosis present

## 2021-12-20 DIAGNOSIS — Z91040 Latex allergy status: Secondary | ICD-10-CM | POA: Insufficient documentation

## 2021-12-20 MED ORDER — PREDNISONE 20 MG PO TABS: 40.0000 mg | ORAL_TABLET | Freq: Every day | ORAL | 0 refills | Status: AC

## 2021-12-20 NOTE — Discharge Instructions (Addendum)
It was a pleasure taking care of you today!   You will be sent a prescription for prednisone, take as directed.  It is important that she keep the area moisturized.  You can use moisturizer such as Aveeno, Cetaphil, CeraVe a lotion.  Try not to scratch at the area.  Follow-up with your primary care provider as needed.  It is important that you maintain follow-up with your rheumatologist.  Return to the Emergency Department for experiencing increasing/worsening fever, redness, drainage, worsening symptoms.

## 2021-12-20 NOTE — ED Notes (Signed)
I provided reinforced discharge education based off of discharge instructions. Pt acknowledged and understood my education. Pt had no further questions/concerns for provider/myself.  °

## 2021-12-20 NOTE — ED Notes (Signed)
Pt was leaving emergency department upon me attempting d/c vital signs.

## 2021-12-20 NOTE — ED Provider Notes (Signed)
[facility] DEPT Provider Note   CSN: [id number] Arrival date & time: 12/20/21  1839     History  Chief Complaint  Patient presents with  . Rash    [name] is a 36 y.o. female who presents to the ED complaining of   Denies new medications, foods, pets, environments, lotion, soaps, detergents, clothes.   Notes that she was being evaluated by her primary care provider for concerns for an autoimmune disease, however, she hasn't followed up with them.    Rash      Home Medications Prior to Admission medications   Medication Sig Start Date End Date Taking? Authorizing Provider  albuterol (PROVENTIL HFA;VENTOLIN HFA) 108 (90 BASE) MCG/ACT inhaler Inhale 2 puffs into the lungs every 4 (four) hours as needed for wheezing or shortness of breath. 11/21/14   Archie Patten, MD  buPROPion (WELLBUTRIN XL) 300 MG 24 hr tablet Take 1 tablet by mouth. 07/22/16   [provider]  buPROPion (WELLBUTRIN XL) 300 MG 24 hr tablet TAKE 1 TABLET BY MOUTH EVERY MORNING 09/18/20 09/18/21  Atha Starks, MD  buPROPion (WELLBUTRIN XL) 300 MG 24 hr tablet TAKE 1 TABLET BY MOUTH EVERY MORNING 08/11/20 08/11/21  Atha Starks, MD  busPIRone (BUSPAR) 15 MG tablet  09/11/18   [provider]  citalopram (CELEXA) 40 MG tablet  07/22/16   [provider]  fluvoxaMINE (LUVOX) 100 MG tablet  09/07/18   [provider]  fluvoxaMINE (LUVOX) 100 MG tablet TAKE 1 TABLET BY MOUTH EVERY NIGHT AT BEDTIME 09/18/20 09/18/21  Atha Starks, MD  fluvoxaMINE (LUVOX) 100 MG tablet TAKE 1 TABLET BY MOUTH AT BEDTIME 07/10/20 07/10/21  Atha Starks, MD  HYDROcodone-acetaminophen (NORCO/VICODIN) 5-325 MG tablet Take 1-2 tablets by mouth every 6 (six) hours as needed. 12/02/19   Raylene Everts, MD  prazosin (MINIPRESS) 2 MG capsule TAKE 1 CAPSULE BY MOUTH AT BEDTIME 04/10/20 04/10/21  Atha Starks, MD  QUEtiapine (SEROQUEL) 100 MG tablet TAKE 1/2 TABLET BY MOUTH AT BEDTIME FOR 7  DAYS THEN 1 TABLET AT BEDTIME 04/10/20 04/10/21  Atha Starks, MD  QUEtiapine (SEROQUEL) 200 MG tablet TAKE 1 TABLET BY MOUTH AT BEDTIME 07/10/20 07/10/21  Atha Starks, MD  QUEtiapine (SEROQUEL) 200 MG tablet TAKE 1 TABLET BY MOUTH AT BEDTIME 05/19/20 05/19/21  Atha Starks, MD  QUEtiapine (SEROQUEL) 200 MG tablet TAKE 1 TABLET BY MOUTH EVERY NIGHT AT BEDTIME 09/18/20 09/18/21  Atha Starks, MD  amphetamine-dextroamphetamine (ADDERALL) 20 MG tablet Take 1 tablet by mouth 2 (two) times daily.  12/02/19  [provider]  clonazePAM Bobbye [place]) 0.5 MG tablet  07/22/16 12/02/19  [provider]  haloperidol (HALDOL) 5 MG tablet  08/06/18 12/02/19  [provider]  pregabalin (LYRICA) 75 MG capsule Take 1 capsule (75 mg total) by mouth 3 (three) times daily. 09/08/16 12/02/19  Bayard Hugger, NP      Allergies    Dilaudid [hydromorphone hcl], Clindamycin/lincomycin, Latex, and Penicillins    Review of Systems   Review of Systems  Skin:  Positive for rash.    Physical Exam Updated Vital Signs BP (!) 147/90   Pulse 65   Temp 98 F (36.7 C)   Resp 20   LMP 09/29/2013   SpO2 100%  Physical Exam  ED Results / Procedures / Treatments   Labs (all labs ordered are listed, but only abnormal results are displayed) Labs Reviewed - No data to display  EKG None  Radiology No results  found.  Procedures Procedures  {Document cardiac monitor, telemetry assessment procedure when appropriate:1}  Medications Ordered in ED Medications - No data to display  ED Course/ Medical Decision Making/ A&P Clinical Course as of 12/20/21 1952  Mon Dec 20, 2021  1951 Offered patient Tylenol for her headache prior to discharge, patient declines at this time.  Patient notes that her headache is similar to her previous headaches.  Denies vision changes or nausea or vomiting at this time.  Has already taken ibuprofen today.  Offered patient additional medications for her headache, patient declines at  this time.  However patient information for dermatologist however patient declines at this time.  Discussed discharge treatment plan with patient at bedside.  Answered all available questions.  Patient for safe discharge at this time. [SB]    Clinical Course User Index [SB] Tanaya Dunigan A, PA-C                           Medical Decision Making  ***  {Document critical care time when appropriate:1} {Document review of labs and clinical decision tools ie heart score, Chads2Vasc2 etc:1}  {Document your independent review of radiology images, and any outside records:1} {Document your discussion with family members, caretakers, and with consultants:1} {Document social determinants of health affecting pt's care:1} {Document your decision making why or why not admission, treatments were needed:1} Final Clinical Impression(s) / ED Diagnoses Final diagnoses:  None    Rx / DC Orders ED Discharge Orders     None

## 2021-12-20 NOTE — ED Triage Notes (Signed)
Pt arrived via POV, c/o rash and itching on face and armpits. Denies any new medications/products ect.

## 2024-01-27 ENCOUNTER — Emergency Department (HOSPITAL_COMMUNITY)

## 2024-01-27 ENCOUNTER — Other Ambulatory Visit: Payer: Self-pay

## 2024-01-27 ENCOUNTER — Emergency Department (HOSPITAL_COMMUNITY): Admission: EM | Admit: 2024-01-27 | Discharge: 2024-01-27 | Disposition: A | Discharge status: Home / Self Care

## 2024-01-27 DIAGNOSIS — Z91040 Latex allergy status: Secondary | ICD-10-CM | POA: Insufficient documentation

## 2024-01-27 DIAGNOSIS — R1030 Lower abdominal pain, unspecified: Secondary | ICD-10-CM | POA: Insufficient documentation

## 2024-01-27 DIAGNOSIS — R112 Nausea with vomiting, unspecified: Secondary | ICD-10-CM | POA: Diagnosis not present

## 2024-01-27 LAB — COMPREHENSIVE METABOLIC PANEL WITH GFR
ALT: 12 U/L (ref 0–44)
AST: 16 U/L (ref 15–41)
Albumin: 4.2 g/dL (ref 3.5–5.0)
Alkaline Phosphatase: 35 U/L — ABNORMAL LOW (ref 38–126)
Anion gap: 9 (ref 5–15)
BUN: 9 mg/dL (ref 6–20)
CO2: 22 mmol/L (ref 22–32)
Calcium: 9.5 mg/dL (ref 8.9–10.3)
Chloride: 105 mmol/L (ref 98–111)
Creatinine, Ser: 0.6 mg/dL (ref 0.44–1.00)
GFR, Estimated: >60 mL/min (ref >60)
Glucose, Bld: 98 mg/dL (ref 70–99)
Potassium: 4 mmol/L (ref 3.5–5.1)
Sodium: 136 mmol/L (ref 135–145)
Total Bilirubin: 0.9 mg/dL (ref 0.0–1.2)
Total Protein: 7.8 g/dL (ref 6.5–8.1)

## 2024-01-27 LAB — CBC
HCT: 41.5 % (ref 36.0–46.0)
Hemoglobin: 14.1 g/dL (ref 12.0–15.0)
MCH: 32.1 pg (ref 26.0–34.0)
MCHC: 34 g/dL (ref 30.0–36.0)
MCV: 94.5 fL (ref 80.0–100.0)
Platelets: 257 K/uL (ref 150–400)
RBC: 4.39 MIL/uL (ref 3.87–5.11)
RDW: 11.4 % — ABNORMAL LOW (ref 11.5–15.5)
WBC: 6.7 K/uL (ref 4.0–10.5)
nRBC: 0 % (ref 0.0–0.2)

## 2024-01-27 LAB — LIPASE, BLOOD: Lipase: 27 U/L (ref 11–51)

## 2024-01-27 LAB — URINALYSIS, ROUTINE W REFLEX MICROSCOPIC
Bilirubin Urine: NEGATIVE
Glucose, UA: NEGATIVE mg/dL
Hgb urine dipstick: NEGATIVE
Ketones, ur: NEGATIVE mg/dL
Leukocytes,Ua: NEGATIVE
Nitrite: NEGATIVE
Protein, ur: NEGATIVE mg/dL
Specific Gravity, Urine: 1.013 (ref 1.005–1.030)
pH: 7 (ref 5.0–8.0)

## 2024-01-27 MED ORDER — IOHEXOL 300 MG/ML  SOLN
100.0000 mL | Freq: Once | INTRAMUSCULAR | Status: AC | PRN
  Administered 2024-01-27: 100 mL via INTRAVENOUS

## 2024-01-27 MED ORDER — SODIUM CHLORIDE 0.9 % IV BOLUS
1000.0000 mL | Freq: Once | INTRAVENOUS | Status: AC
  Administered 2024-01-27: 1000 mL via INTRAVENOUS

## 2024-01-27 MED ORDER — ONDANSETRON HCL 4 MG/2ML IJ SOLN
4.0000 mg | Freq: Once | INTRAMUSCULAR | Status: AC
  Administered 2024-01-27: 4 mg via INTRAVENOUS
  Filled 2024-01-27: qty 2

## 2024-01-27 MED ORDER — DIPHENHYDRAMINE HCL 50 MG/ML IJ SOLN
25.0000 mg | Freq: Once | INTRAMUSCULAR | Status: AC
  Administered 2024-01-27: 25 mg via INTRAVENOUS
  Filled 2024-01-27: qty 1

## 2024-01-27 MED ORDER — HYDROMORPHONE HCL 1 MG/ML IJ SOLN
1.0000 mg | Freq: Once | INTRAMUSCULAR | Status: AC
  Administered 2024-01-27: 1 mg via INTRAVENOUS
  Filled 2024-01-27: qty 1

## 2024-01-27 MED ORDER — MORPHINE SULFATE (PF) 4 MG/ML IV SOLN
4.0000 mg | Freq: Once | INTRAVENOUS | Status: AC
  Administered 2024-01-27: 4 mg via INTRAVENOUS
  Filled 2024-01-27: qty 1

## 2024-01-27 NOTE — ED Triage Notes (Signed)
 Patient to ED by EMS from home with c/o L side ABD pain that started this morning. She has a HX of GI. She denies constipation, had 2 emesis today. EMS started 20g in L AC and gave 100mcg of fentanyl  and 4mg  of Zofran .

## 2024-01-27 NOTE — Discharge Instructions (Addendum)
 It was a pleasure taking care of you today.  Based on your history, physical exam, labs, and imaging I feel you are safe for discharge.  Your lab workup today was overall reassuring as well as your imaging.  The only notable imaging findings were the previously known about left-sided complex cyst versus endometrioma.  I do recommend that you continue to follow-up with GI and OB/GYN outpatient as well as your primary care provider.  If symptoms persist recommend follow-up within 48 hours.  Please return to the emergency room or seek further medical care if experiencing the following symptoms included but not limited to fever, chills, chest pain, shortness of breath, severe abdominal pain, intractable nausea/vomiting, severe pelvic pain, or other concerning symptom.  Make your primary care provider as well as GI as well as your OB/GYN aware of your visit and workup today as well as your imaging results.

## 2024-01-27 NOTE — ED Provider Notes (Signed)
 [place] EMERGENCY DEPARTMENT AT [facility] Provider Note   CSN: [id number] Arrival date & time: 01/27/24  1120     Patient presents with: Abdominal Pain   [name] is a 38 y.o. female who presents to the emergency department with a chief complaint of lower abdominal pain as well as nausea and vomiting.  Patient was brought in via EMS and was given fentanyl  as well as Zofran  en route.  Patient states that her abdominal pain started last night.  She states that she is currently following up outpatient with GI due to ongoing abdominal pain and related symptoms, stating that she just had an endoscopy completed last Saturday which subjectively by the patient showed mild inflammation.  Patient states that pain improves after vomiting.  States that she has had abdominal pain like this in the past.  Denies fever, chills, chest pain, shortness of breath, hematochezia, hematuria, dysuria, urgency/frequency.  Patient has a past medical history significant for migraines, painful menstruation, bipolar 2 disorder, fibroids, pelvic abscess, etc.  Of note patient does have a known history of complex cyst versus other finding on CT as well as ultrasound imaging that she has seen OB/GYN outpatient for.    Abdominal Pain     Prior to Admission medications   Medication Sig Start Date End Date Taking? Authorizing Provider  albuterol  (PROVENTIL  HFA;VENTOLIN  HFA) 108 (90 BASE) MCG/ACT inhaler Inhale 2 puffs into the lungs every 4 (four) hours as needed for wheezing or shortness of breath. 11/21/14   Federico Camelia RAMAN, MD  buPROPion (WELLBUTRIN XL) 300 MG 24 hr tablet Take 1 tablet by mouth. 07/22/16   [provider]  buPROPion (WELLBUTRIN XL) 300 MG 24 hr tablet TAKE 1 TABLET BY MOUTH EVERY MORNING 09/18/20 09/18/21  Gracia Merck, MD  buPROPion (WELLBUTRIN XL) 300 MG 24 hr tablet TAKE 1 TABLET BY MOUTH EVERY MORNING 08/11/20 08/11/21  Gracia Merck, MD  busPIRone (BUSPAR) 15 MG tablet   09/11/18   [provider]  citalopram (CELEXA) 40 MG tablet  07/22/16   [provider]  fluvoxaMINE (LUVOX) 100 MG tablet  09/07/18   [provider]  fluvoxaMINE (LUVOX) 100 MG tablet TAKE 1 TABLET BY MOUTH EVERY NIGHT AT BEDTIME 09/18/20 09/18/21  Gracia Merck, MD  fluvoxaMINE (LUVOX) 100 MG tablet TAKE 1 TABLET BY MOUTH AT BEDTIME 07/10/20 07/10/21  Gracia Merck, MD  HYDROcodone -acetaminophen  (NORCO/VICODIN) 5-325 MG tablet Take 1-2 tablets by mouth every 6 (six) hours as needed. 12/02/19   Maranda Jamee Jacob, MD  prazosin (MINIPRESS) 2 MG capsule TAKE 1 CAPSULE BY MOUTH AT BEDTIME 04/10/20 04/10/21  Gracia Merck, MD  QUEtiapine (SEROQUEL) 100 MG tablet TAKE 1/2 TABLET BY MOUTH AT BEDTIME FOR 7 DAYS THEN 1 TABLET AT BEDTIME 04/10/20 04/10/21  Gracia Merck, MD  QUEtiapine (SEROQUEL) 200 MG tablet TAKE 1 TABLET BY MOUTH AT BEDTIME 07/10/20 07/10/21  Gracia Merck, MD  QUEtiapine (SEROQUEL) 200 MG tablet TAKE 1 TABLET BY MOUTH AT BEDTIME 05/19/20 05/19/21  Gracia Merck, MD  QUEtiapine (SEROQUEL) 200 MG tablet TAKE 1 TABLET BY MOUTH EVERY NIGHT AT BEDTIME 09/18/20 09/18/21  Gracia Merck, MD  amphetamine-dextroamphetamine (ADDERALL) 20 MG tablet Take 1 tablet by mouth 2 (two) times daily.  12/02/19  [provider]  clonazePAM (KLONOPIN) 0.5 MG tablet  07/22/16 12/02/19  [provider]  haloperidol (HALDOL) 5 MG tablet  08/06/18 12/02/19  [provider]  pregabalin  (LYRICA ) 75 MG capsule Take 1 capsule (75 mg total) by mouth  3 (three) times daily. 09/08/16 12/02/19  Debby Fidela CROME, NP    Allergies: Dilaudid  [hydromorphone  hcl], Clindamycin /lincomycin, Latex, and Penicillins    Review of Systems  Gastrointestinal:  Positive for abdominal pain.    Updated Vital Signs BP 136/75 (BP Location: Right Arm)   Pulse (!) 55   Temp 99 F (37.2 C)   Resp 16   Ht 5' 7 (1.702 m)   Wt 100 kg   LMP 09/29/2013   SpO2 98%   BMI 34.53 kg/m   Physical Exam Vitals and nursing  note reviewed.  Constitutional:      General: She is awake. She is not in acute distress.    Appearance: She is well-developed. She is not ill-appearing, toxic-appearing or diaphoretic.  Cardiovascular:     Rate and Rhythm: Normal rate and regular rhythm.  Pulmonary:     Effort: Pulmonary effort is normal. No respiratory distress.     Breath sounds: Normal breath sounds. No wheezing, rhonchi or rales.  Abdominal:     General: Abdomen is flat. There is no distension.     Palpations: Abdomen is soft.     Tenderness: There is generalized abdominal tenderness and tenderness in the suprapubic area. There is no right CVA tenderness, left CVA tenderness, guarding or rebound.  Skin:    General: Skin is warm and dry.     Capillary Refill: Capillary refill takes less than 2 seconds.  Neurological:     General: No focal deficit present.     Mental Status: She is alert and oriented to person, place, and time.  Psychiatric:        Mood and Affect: Mood normal.        Behavior: Behavior normal. Behavior is cooperative.     (all labs ordered are listed, but only abnormal results are displayed) Labs Reviewed  COMPREHENSIVE METABOLIC PANEL WITH GFR - Abnormal; Notable for the following components:      Result Value   Alkaline Phosphatase 35 (*)    All other components within normal limits  CBC - Abnormal; Notable for the following components:   RDW 11.4 (*)    All other components within normal limits  LIPASE, BLOOD  URINALYSIS, ROUTINE W REFLEX MICROSCOPIC    EKG: None  Radiology: US  PELVIS TRANSVAGINAL NON-OB (TV ONLY) Result Date: 01/27/2024 CLINICAL DATA:  Left adnexal cyst seen on the earlier CT. Pelvic pain. EXAM: ULTRASOUND PELVIS TRANSVAGINAL TECHNIQUE: Transvaginal ultrasound examination of the pelvis was performed including evaluation of the uterus, ovaries, adnexal regions, and pelvic cul-de-sac. COMPARISON:  CT abdomen pelvis dated 01/27/2024 and pelvic ultrasound dated  12/30/2014. FINDINGS: Uterus Hysterectomy. Endometrium Hysterectomy. Right ovary Not visualized. Left ovary Measurements: 4.2 x 3.0 x 3.9 cm = volume: 26 mL. There is a 4.1 x 2.4 x 3.3 cm complex cyst with lace-like internal architecture in the left ovary. Areas of internal vascularity within this lesion may be present. This may represent a hemorrhagic cyst or an endometrioma given similar finding on the ultrasound of 2016. Other etiologies are not excluded. Follow-up with ultrasound after 2 menstrual cycles recommended. If this lesion persists on follow-up ultrasound, further evaluation with MRI is advised. Doppler images demonstrate arterial and venous flow to the left ovary. Other findings:  No abnormal free fluid IMPRESSION: Complex left ovarian lesion as above possibly a hemorrhagic cyst or an endometrioma. Follow-up recommended. Electronically Signed   By: Vanetta Chou M.D.   On: 01/27/2024 15:24   CT ABDOMEN PELVIS W CONTRAST Result Date:  01/27/2024 CLINICAL DATA:  Acute left-sided abdominal pain beginning this morning. Vomiting. EXAM: CT ABDOMEN AND PELVIS WITH CONTRAST TECHNIQUE: Multidetector CT imaging of the abdomen and pelvis was performed using the standard protocol following bolus administration of intravenous contrast. RADIATION DOSE REDUCTION: This exam was performed according to the departmental dose-optimization program which includes automated exposure control, adjustment of the mA and/or kV according to patient size and/or use of iterative reconstruction technique. CONTRAST:  OMNIPAQUE  IOHEXOL  300 MG/ML  SOLN COMPARISON:  11/24/2013 FINDINGS: Lower Chest: No acute findings. Hepatobiliary: No suspicious hepatic masses identified. Gallbladder is unremarkable. No evidence of biliary ductal dilatation. Pancreas:  No mass or inflammatory changes. Spleen: Within normal limits in size and appearance. Adrenals/Urinary Tract: No suspicious masses identified. No evidence of ureteral calculi  or hydronephrosis. Unremarkable unopacified urinary bladder. Stomach/Bowel: No evidence of obstruction, inflammatory process or abnormal fluid collections. Normal appendix visualized. Vascular/Lymphatic: No pathologically enlarged lymph nodes. No acute vascular findings. Reproductive: Prior hysterectomy noted. Adnexal regions are unremarkable in appearance. A cystic lesion is seen in the left adnexa which measures 4.5 x 3.3 cm, shows mild heterogeneity of attenuation. No other mass or ascites identified. Other:  None. Musculoskeletal:  No suspicious bone lesions identified. IMPRESSION: 4.5 cm mildly complex cystic lesion in left adnexa. Recommend pelvic ultrasound for further characterization. No other acute findings. Electronically Signed   By: Norleen DELENA Kil M.D.   On: 01/27/2024 14:25     Procedures   Medications Ordered in the ED  morphine  (PF) 4 MG/ML injection 4 mg (4 mg Intravenous Given 01/27/24 1332)  sodium chloride  0.9 % bolus 1,000 mL (0 mLs Intravenous Stopped 01/27/24 1700)  iohexol  (OMNIPAQUE ) 300 MG/ML solution 100 mL (100 mLs Intravenous Contrast Given 01/27/24 1354)  ondansetron  (ZOFRAN ) injection 4 mg (4 mg Intravenous Given 01/27/24 1658)  HYDROmorphone  (DILAUDID ) injection 1 mg (1 mg Intravenous Given 01/27/24 1658)  diphenhydrAMINE  (BENADRYL ) injection 25 mg (25 mg Intravenous Given 01/27/24 1658)                                    Medical Decision Making Amount and/or Complexity of Data Reviewed Labs: ordered. Radiology: ordered.  Risk Prescription drug management.   Patient presents to the ED for concern of abdominal pain, nausea, vomiting, this involves an extensive number of treatment options, and is a complaint that carries with it a high risk of complications and morbidity.  The differential diagnosis includes appendicitis, cholecystitis, diverticulitis, pancreatitis, pregnancy, ovarian torsion, gastritis, etc.   Co morbidities that complicate the patient  evaluation  migraines, painful menstruation, bipolar 2 disorder, fibroids, pelvic abscess   Additional history obtained:  Reviewed imaging results from emergency department visit at [facility] on 01/03/2019 for significant for complex lesion of the left ovary as well as no evidence of torsion   Imaging Studies ordered:  I ordered imaging studies including  I independently visualized and interpreted imaging which showed 4.5 cm complex cystic lesion in the left adnexa, confirmed on transvaginal ultrasound possibly consistent with complex cyst versus cyst versus endometrioma I agree with the radiologist interpretation   Medicines ordered and prescription drug management:  I ordered medication including morphine , Dilaudid  for pain, Zofran  for nausea, Benadryl  for itchiness with Dilaudid  Reevaluation of the patient after these medicines showed that the patient improved I have reviewed the patients home medicines and have made adjustments as needed   Test Considered:  None   Critical  Interventions:  None   Problem List / ED Course:  38 year old female, GI history currently had an outpatient endoscopy performed, scheduled for colonoscopy, following with GI and has followed with OB/GYN Patient was transported from home by EMS due to left-sided abdominal pain that started this morning, also 2 episodes of emesis.  Denies hematemesis. Patient states that she has had abdominal pain like this previously Vital signs stable, abdominal pain lab workup ordered, due to tenderness and area of pain CT abdomen pelvis as well as transvaginal ultrasound ordered Labs overall reassuring, CT abdomen pelvis and ultrasound only show previously known about left ovarian complex cyst versus endometrioma Patient symptomatically treated with IV pain medication as well as fluids, plan to reassess after second dose of pain medications and plan for discharge home with GI as well as OB/GYN follow-up Return  precautions given Patient discharged I feel like this patient is clinically stable for discharge, stable vital signs, afebrile, lab work overall reassuring, imaging shows only previously known about findings, this patient did improve with symptomatic treatment in the ED, patient understands to follow-up with primary care, GI, as well as OB/GYN and make them aware of her visit today as well as findings for ongoing diagnosis and treatment. Unknown cause of abdominal pain at this time, possibly related to complex cystic lesion in the left adnexal region, however imaging today and lab work shows no acute reason for admission, patient did improve with symptomatic treatment in the emergency department, patient states that she has good follow-up with GI as well as other outpatient specialist and PCP, no sign of torsion or acute intra-abdominal process on today's workup  Reevaluation:  After the interventions noted above, I reevaluated the patient and found that they have :improved   Social Determinants of Health:  none   Dispostion:  After consideration of the diagnostic results and the patients response to treatment, I feel that the patent would benefit from discharge and follow-up with specialist and primary care provider if symptoms persist or worsen.  If symptoms persist recommend follow-up within 48 hours.  Return precautions given.  Patient discharged.     Final diagnoses:  Lower abdominal pain    ED Discharge Orders     None          Adryan Druckenmiller F, PA-C 01/27/24 2016    Simon Lavonia SAILOR, MD 01/28/24 [phone]
